# Patient Record
Sex: Male | Born: 1935 | Race: White | Hispanic: No | Smoking: Never smoker
Health system: Southern US, Community
[De-identification: ages and names within clinical notes are randomized; demographics above are authoritative.]

## PROBLEM LIST (undated history)

## (undated) DIAGNOSIS — D72829 Elevated white blood cell count, unspecified: Secondary | ICD-10-CM

## (undated) DIAGNOSIS — E876 Hypokalemia: Secondary | ICD-10-CM

## (undated) DIAGNOSIS — K922 Gastrointestinal hemorrhage, unspecified: Secondary | ICD-10-CM

## (undated) DIAGNOSIS — I482 Chronic atrial fibrillation, unspecified: Secondary | ICD-10-CM

## (undated) DIAGNOSIS — I2510 Atherosclerotic heart disease of native coronary artery without angina pectoris: Secondary | ICD-10-CM

## (undated) DIAGNOSIS — I509 Heart failure, unspecified: Secondary | ICD-10-CM

## (undated) DIAGNOSIS — I2119 ST elevation (STEMI) myocardial infarction involving other coronary artery of inferior wall: Secondary | ICD-10-CM

## (undated) DIAGNOSIS — Z87442 Personal history of urinary calculi: Secondary | ICD-10-CM

## (undated) DIAGNOSIS — E785 Hyperlipidemia, unspecified: Secondary | ICD-10-CM

## (undated) DIAGNOSIS — K219 Gastro-esophageal reflux disease without esophagitis: Secondary | ICD-10-CM

## (undated) DIAGNOSIS — F419 Anxiety disorder, unspecified: Secondary | ICD-10-CM

## (undated) DIAGNOSIS — K449 Diaphragmatic hernia without obstruction or gangrene: Secondary | ICD-10-CM

## (undated) DIAGNOSIS — N200 Calculus of kidney: Secondary | ICD-10-CM

## (undated) DIAGNOSIS — I2109 ST elevation (STEMI) myocardial infarction involving other coronary artery of anterior wall: Secondary | ICD-10-CM

## (undated) DIAGNOSIS — I10 Essential (primary) hypertension: Secondary | ICD-10-CM

## (undated) DIAGNOSIS — I4891 Unspecified atrial fibrillation: Secondary | ICD-10-CM

## (undated) DIAGNOSIS — N400 Enlarged prostate without lower urinary tract symptoms: Secondary | ICD-10-CM

## (undated) DIAGNOSIS — G4730 Sleep apnea, unspecified: Secondary | ICD-10-CM

## (undated) DIAGNOSIS — I255 Ischemic cardiomyopathy: Secondary | ICD-10-CM

## (undated) HISTORY — DX: ST elevation (STEMI) myocardial infarction involving other coronary artery of inferior wall: I21.19

## (undated) HISTORY — PX: TOTAL KNEE ARTHROPLASTY: SHX125

## (undated) HISTORY — DX: Ischemic cardiomyopathy: I25.5

## (undated) HISTORY — DX: Gastrointestinal hemorrhage, unspecified: K92.2

## (undated) HISTORY — DX: Hyperlipidemia, unspecified: E78.5

## (undated) HISTORY — DX: Essential (primary) hypertension: I10

## (undated) HISTORY — DX: Diaphragmatic hernia without obstruction or gangrene: K44.9

## (undated) HISTORY — DX: Calculus of kidney: N20.0

## (undated) HISTORY — PX: TONSILLECTOMY: SUR1361

## (undated) HISTORY — DX: Benign prostatic hyperplasia without lower urinary tract symptoms: N40.0

## (undated) HISTORY — DX: Sleep apnea, unspecified: G47.30

## (undated) HISTORY — PX: EYE SURGERY: SHX253

## (undated) HISTORY — DX: Atherosclerotic heart disease of native coronary artery without angina pectoris: I25.10

## (undated) HISTORY — DX: ST elevation (STEMI) myocardial infarction involving other coronary artery of anterior wall: I21.09

## (undated) HISTORY — DX: Chronic atrial fibrillation, unspecified: I48.20

## (undated) HISTORY — DX: Gastro-esophageal reflux disease without esophagitis: K21.9

---
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
## 1998-03-15 ENCOUNTER — Other Ambulatory Visit: Admission: RE | Admit: 1998-03-15 | Discharge: 1998-03-15 | Payer: Self-pay | Admitting: Urology

## 2000-06-10 ENCOUNTER — Ambulatory Visit (HOSPITAL_BASED_OUTPATIENT_CLINIC_OR_DEPARTMENT_OTHER): Admission: RE | Admit: 2000-06-10 | Discharge: 2000-06-10 | Payer: Self-pay | Admitting: *Deleted

## 2000-11-22 ENCOUNTER — Ambulatory Visit (HOSPITAL_COMMUNITY): Admission: RE | Admit: 2000-11-22 | Discharge: 2000-11-22 | Payer: Self-pay | Admitting: Gastroenterology

## 2000-11-22 ENCOUNTER — Encounter (INDEPENDENT_AMBULATORY_CARE_PROVIDER_SITE_OTHER): Payer: Self-pay | Admitting: Specialist

## 2004-01-17 ENCOUNTER — Ambulatory Visit (HOSPITAL_COMMUNITY): Admission: RE | Admit: 2004-01-17 | Discharge: 2004-01-17 | Payer: Self-pay | Admitting: Gastroenterology

## 2004-01-17 ENCOUNTER — Encounter (INDEPENDENT_AMBULATORY_CARE_PROVIDER_SITE_OTHER): Payer: Self-pay | Admitting: Specialist

## 2004-10-30 ENCOUNTER — Inpatient Hospital Stay (HOSPITAL_BASED_OUTPATIENT_CLINIC_OR_DEPARTMENT_OTHER): Admission: RE | Admit: 2004-10-30 | Discharge: 2004-10-30 | Payer: Self-pay | Admitting: Cardiology

## 2010-06-22 ENCOUNTER — Ambulatory Visit: Payer: Self-pay | Admitting: Cardiology

## 2010-10-13 ENCOUNTER — Encounter: Admission: RE | Admit: 2010-10-13 | Discharge: 2010-10-13 | Payer: Self-pay | Admitting: Gastroenterology

## 2010-11-02 ENCOUNTER — Encounter
Admission: RE | Admit: 2010-11-02 | Discharge: 2010-11-02 | Payer: Self-pay | Source: Home / Self Care | Attending: Cardiology | Admitting: Cardiology

## 2010-11-02 ENCOUNTER — Ambulatory Visit: Payer: Self-pay | Admitting: Cardiology

## 2011-03-12 ENCOUNTER — Other Ambulatory Visit: Payer: Self-pay | Admitting: Cardiology

## 2011-03-12 DIAGNOSIS — E7800 Pure hypercholesterolemia, unspecified: Secondary | ICD-10-CM

## 2011-03-12 NOTE — Telephone Encounter (Signed)
escribe request  

## 2011-03-30 NOTE — Op Note (Signed)
NAME:  Henry Ayala, Henry Ayala                        ACCOUNT NO.:  [id number]   MEDICAL RECORD NO.:  [id number]                   PATIENT TYPE:  AMB   LOCATION:  ENDO                                 FACILITY:  [facility] [place]   PHYSICIAN:  James L. Malon Kindle., M.D.          DATE OF BIRTH:  07-08-1931   DATE OF PROCEDURE:  01/17/2004  DATE OF DISCHARGE:                                 OPERATIVE REPORT   PROCEDURE:  Colonoscopy and polypectomy.   MEDICATIONS:  1. Fentanyl 62.5 mcg.  2. Versed 7 mg IV.   INDICATION:  Previous history of adenomatous polyps.  This is done as a  follow-up.   DESCRIPTION OF PROCEDURE:  The procedure had been explained to the patient  and consent obtained.  With the patient in the left lateral decubitus  position, the Olympus scope was inserted and advanced.  We were able to  advance easily to the cecum.  The terminal ileum was entered for a short  distance and was normal.  The scope was withdrawn, and the cecum, ascending  colon, transverse colon, descending colon were seen well.  In the distal  descending colon, at approximately 60 cm from the anal verge, a 0.75 cm  polyp was encountered, was removed with the snare and sucked through the  scope.  There was no bleeding.  No other polyps were seen in the descending  or sigmoid colon.  There was scattered diverticula.  The scope withdrawn.  The rectum was free of polyps.  The patient tolerated the procedure well.   ASSESSMENT:  Descending colon polyp, removed, 221.3.   PLAN:  We will check path and recommend repeating procedure in 3 years.  We will give Plavix for 3 days and then aspirin for 7.                                               James L. Malon Kindle., M.D.    Waldron Session  D:  01/17/2004  T:  01/17/2004  Job:  [id number]   cc:   Cassell Clement, M.D.  [address], [address]  [place]  [place] [address]  Fax: [phone]

## 2011-03-30 NOTE — H&P (Signed)
NAME:  Henry Ayala, VO NO.:  [id number]   MEDICAL RECORD NO.:  [id number]          PATIENT TYPE:  AMB   LOCATION:                               FACILITY:  [facility]   PHYSICIAN:  Peter M. Swaziland, M.D.  DATE OF BIRTH:  1931-03-26   DATE OF ADMISSION:  10/30/2004  DATE OF DISCHARGE:                                HISTORY & PHYSICAL   HISTORY OF PRESENT ILLNESS:  Henry Ayala is a 77 year old white male with  history of coronary artery disease.  He suffered an anterior septal  myocardial infarction in 1992 and was treated with direct angioplasty to the  LAD.  Subsequent followup cardiac catheterizations in 1994 and 1995 showed  nonobstructive disease.  He subsequently suffered an acute inferior  myocardial infarction in August 2004 and underwent stenting of the right  coronary artery at [facility].  On recent stress  Cardiolite study, the patient performed to 9 minutes and 40 seconds on the  Bruce protocol with mild symptoms of dyspnea.  His ECG showed no acute  changes.  Cardiolite images demonstrated both anteroseptal and inferior wall  defects with partial reversibility of the anteroseptal defect.  Ejection  fraction was depressed at 37%.  Based on his abnormal Cardiolite study,  followup cardiac catheterization was recommended.   Of note, the patient has experienced some mild dyspnea with exertion.  He  denies any chest pain, palpitations, diaphoresis, nausea or vomiting.   PAST MEDICAL HISTORY:  1.  Anteroseptal myocardial infarction in 1992.  2.  Inferior myocardial infarction in August 2004.  3.  Colon polyps.  4.  Hyperlipidemia.  5.  Nephrolithiasis.  6.  BPH.  7.  Hiatal hernia.  8.  Status post right knee surgery.   ALLERGIES:  Questionable allergy to SULFA.  He also has a questionable  allergy to DYE in 1992 where he developed a rash post hospitalization.  He  has had no reaction to dye study since that time.   MEDICATIONS:  1.   Aspirin 81 mg per day.  2.  Lopressor 25 mg per day.  3.  Zoloft 50 mg per day.  4.  Cardura 2 mg per day.  5.  Prevacid 30 mg per day.  6.  Crestor 10 mg per day.  7.  Plavix 75 mg per day.  8.  Eye drops, unknown type.   FAMILY HISTORY:  Father died at age 53 with myocardial infarction.  One  brother died with myocardial infarction.  One brother has had multiple  coronary stents placed.  His mother died with a stroke at age 46.   SOCIAL HISTORY:  The patient is retired.  He is married.  He has two  children.  He denies tobacco use and occasionally drinks alcohol.   REVIEW OF SYSTEMS:  The patient does exercise regularly and denies any  significant complaints with that.  He does note some mild shortness of  breath on exertion.  He does have a history of sleep apnea.  Other Review of  Systems are negative.   PHYSICAL EXAMINATION:  GENERAL:  The  patient is a pleasant white male in no  apparent distress.  VITAL SIGNS:  Weight 169.  Blood pressure 110/70, pulse 60 and regular,  respirations normal.  HEENT:  Normocephalic and atraumatic.  Pupils equal, round, and reactive to  light and accommodation.  Extraocular movements are full.  Oropharynx is  clear.  NECK:  Supple without JVD, adenopathy, thyromegaly, or bruits.  LUNGS:  Clear.  CARDIAC:  Regular rate and rhythm.  Normal S1 and S2 without gallop, murmur,  rub, or click.  ABDOMEN:  Soft and nontender.  He has no masses or hepatosplenomegaly.  EXTREMITIES:  Femoral and pedal pulses are 2+ and symmetric.  NEUROLOGIC:  Nonfocal.   LABORATORY DATA AND OTHER STUDIES:  ECG shows normal sinus rhythm with old  inferior myocardial infarction and old anteroseptal myocardial infarction.   Chest x-ray shows no active disease.   Coags were normal.  Glucose 65, BUN 12, creatinine 1.1.  Sodium 140,  potassium 4.4, chloride 105, CO2 28.  CBC is normal.   IMPRESSION:  1.  Abnormal stress Cardiolite study.  2.  Prior anteroseptal  myocardial infarction in 1992.  3.  Prior inferior myocardial infarction August 2004.  4.  Ischemic left ventricular dysfunction.  5.  Hyperlipidemia.   PLAN:  Will proceed with cardiac catheterization with further therapy  pending these results.        ___________________________________________  Peter M. Swaziland, M.D.    PMJ/MEDQ  D:  10/27/2004  T:  10/27/2004  Job:  [id number]   cc:   Cassell Clement, M.D.  [address]  [place]  [place] [address]  Fax: [phone]

## 2011-03-30 NOTE — Cardiovascular Report (Signed)
NAME:  Henry Ayala, Henry Ayala NO.:  [id number]   MEDICAL RECORD NO.:  [id number]          PATIENT TYPE:  OIB   LOCATION:  6501                         FACILITY:  [facility]   PHYSICIAN:  Peter M. Swaziland, M.D.  DATE OF BIRTH:  [birth year]   DATE OF PROCEDURE:  10/30/2004  DATE OF DISCHARGE:  10/30/2004                              CARDIAC CATHETERIZATION   INDICATION FOR PROCEDURE:  A 77 year old white male with a history of prior  myocardial infarction in 1992 and again in 2004.  Initially had angioplasty  with his first MI in LAD.  His last infarct involved the right coronary  territory and stenting in the proximal vessel.  He has had mild symptoms of  dyspnea and had a recent abnormal stress test.   PROCEDURE:  1.  Left heart catheterization.  2.  Coronary and left ventricular angiography.   ACCESS:  Via the right femoral artery using standard Seldinger technique.   EQUIPMENT:  4-French 4 cm right and left Judkins catheter, 4-French pigtail  catheter, 4-French arterial sheath.   CONTRAST:  100 mL of Omnipaque.   MEDICATIONS:  Local anesthesia 1% Xylocaine.   HEMODYNAMIC DATA:  1.  Aortic pressure was 113/66 with a mean of 87.  2.  Left ventricular pressure was 119 with EDP of 10 mmHg.   ANGIOGRAPHIC DATA:  1.  The left coronary artery arises and distributes normally.  The left main      coronary artery has minor wall irregularities less than 10%.  2.  Left anterior descending artery is a large vessel which extends to the      apex.  It also has mild wall irregularities less than 10%.  There is a      large septal perforator branch which appears normal.  There are two      large diagonal branches which predominantly supply the lateral wall and      they appear normal.  3.  The left circumflex coronary artery is a small vessel.  It is occluded      proximally.  There are right to left collaterals to the distal      circumflex.  4.  The right coronary artery arises  and distributes normally.  This is a      large, dominant vessel.  There is a stent visible in the proximal      vessel.  It appears widely patent.  There are mild irregularities      throughout the proximal mid and distal right coronary artery.  The      posterior descending artery has an eccentric 50-70% stenosis in the      proximal vessel.  This is a focal lesion.  There are two posterolateral      branches which are without significant disease.   LEFT VENTRICULOGRAPHY:  Left ventricular angiography performed in the RAO  view demonstrates moderate hypokinesia involving the anterolateral wall and  inferior wall.  Ejection fraction is estimated at 35-40%.  There is no  significant mitral insufficiency.   FINAL INTERPRETATION:  1.  Two vessel atherosclerotic coronary artery disease.  There is continued      excellent long-term patency of the prior angioplasty site in the left      anterior descending and the prior stent site in the proximal right      coronary artery.  There is total occlusion of the proximal circumflex      which is a small branch and is not suitable for intervention.  There is      moderate disease in the proximal posterior descending artery.  2.  Moderate left ventricular dysfunction.   PLAN:  Recommend medical therapy at this time.  Will obtain a copy of his  old catheterization films for comparison.       PMJ/MEDQ  D:  10/30/2004  T:  10/31/2004  Job:  [id number]   cc:   Cassell Clement, M.D.  [address], [address]  [place]  [place] [address]  Fax: [phone]

## 2011-03-30 NOTE — Procedures (Signed)
[facility]  Patient:    Henry Ayala, Henry Ayala                     MRN: [id number] Proc. Date: 11/22/00 Adm. Date:  [id number] Attending:  Orland Mustard CC:         Clovis Pu Patty Sermons, M.D.   Procedure Report  DATE OF BIRTH:  Jan 25, 1931  PROCEDURE:  Colonoscopy with coagulation of polyp.  SURGEON:  James L. Edwards, M.D.  MEDICATIONS:  Fentanyl 75 mcg and Versed 6 mg IV.  INDICATIONS:  A nice gentleman with a strong family history of colon cancer. Had a colonoscopy three years ago with the finding of an adenomatous polyp. This procedure is done to evaluate for additional polyps.  DESCRIPTION OF PROCEDURE:  The procedure had been explained to the patient and consent obtained.  With the patient in the left lateral decubitus position, the Olympus adult video colonoscope was inserted and advanced under direct visualization.  The prep was excellent.  We were able to advance to the cecum without difficulty.  The cecum did reveal three polyps, each 2-3 mm in diameter.  Each was cauterized and placed in a single jar.  No other polyps were seen in the cecum.  The scope was withdrawn.  The cecum, ascending colon, hepatic flexure, transverse colon, splenic flexure, descending and sigmoid colon were seen well upon removal.  No polyps or other lesions were seen. There was no significant diverticular disease.  The rectum was seen well and was normal.  The scope was withdrawn.  The patient tolerated the procedure well and was maintained on low flow oxygen and pulse oximeter throughout the procedure with no obvious problems.  ASSESSMENT:  Cecal polyps, cauterized.  PLAN:  Will recommend repeating procedure in three years. DD:  11/22/00 TD:  11/23/00 Job: [id number]

## 2011-04-25 ENCOUNTER — Encounter: Payer: Self-pay | Admitting: *Deleted

## 2011-04-26 ENCOUNTER — Other Ambulatory Visit: Payer: Self-pay | Admitting: *Deleted

## 2011-04-26 DIAGNOSIS — E785 Hyperlipidemia, unspecified: Secondary | ICD-10-CM

## 2011-04-26 DIAGNOSIS — Z125 Encounter for screening for malignant neoplasm of prostate: Secondary | ICD-10-CM

## 2011-04-27 ENCOUNTER — Other Ambulatory Visit: Payer: Self-pay | Admitting: *Deleted

## 2011-04-27 ENCOUNTER — Encounter: Payer: Self-pay | Admitting: Cardiology

## 2011-04-27 ENCOUNTER — Other Ambulatory Visit (INDEPENDENT_AMBULATORY_CARE_PROVIDER_SITE_OTHER): Payer: Medicare Other | Admitting: *Deleted

## 2011-04-27 ENCOUNTER — Ambulatory Visit (INDEPENDENT_AMBULATORY_CARE_PROVIDER_SITE_OTHER): Payer: Medicare Other | Admitting: Cardiology

## 2011-04-27 VITALS — BP 120/70 | HR 64 | Wt 164.0 lb

## 2011-04-27 DIAGNOSIS — K219 Gastro-esophageal reflux disease without esophagitis: Secondary | ICD-10-CM

## 2011-04-27 DIAGNOSIS — N400 Enlarged prostate without lower urinary tract symptoms: Secondary | ICD-10-CM

## 2011-04-27 DIAGNOSIS — R351 Nocturia: Secondary | ICD-10-CM

## 2011-04-27 DIAGNOSIS — E785 Hyperlipidemia, unspecified: Secondary | ICD-10-CM

## 2011-04-27 DIAGNOSIS — Z125 Encounter for screening for malignant neoplasm of prostate: Secondary | ICD-10-CM

## 2011-04-27 DIAGNOSIS — I259 Chronic ischemic heart disease, unspecified: Secondary | ICD-10-CM | POA: Insufficient documentation

## 2011-04-27 DIAGNOSIS — E7800 Pure hypercholesterolemia, unspecified: Secondary | ICD-10-CM | POA: Insufficient documentation

## 2011-04-27 DIAGNOSIS — I519 Heart disease, unspecified: Secondary | ICD-10-CM

## 2011-04-27 DIAGNOSIS — N401 Enlarged prostate with lower urinary tract symptoms: Secondary | ICD-10-CM

## 2011-04-27 LAB — HEPATIC FUNCTION PANEL
ALT: 18 U/L (ref 0–53)
Albumin: 4.2 g/dL (ref 3.5–5.2)
Alkaline Phosphatase: 58 U/L (ref 39–117)
Bilirubin, Direct: 0.1 mg/dL (ref 0.0–0.3)
Total Protein: 6.8 g/dL (ref 6.0–8.3)

## 2011-04-27 LAB — BASIC METABOLIC PANEL
CO2: 29 mEq/L (ref 19–32)
Calcium: 9.1 mg/dL (ref 8.4–10.5)
Chloride: 103 mEq/L (ref 96–112)
Glucose, Bld: 93 mg/dL (ref 70–99)
Sodium: 138 mEq/L (ref 135–145)

## 2011-04-27 LAB — PSA: PSA: 1.54 ng/mL (ref 0.10–4.00)

## 2011-04-27 LAB — LIPID PANEL: Total CHOL/HDL Ratio: 3

## 2011-04-27 MED ORDER — CLOPIDOGREL BISULFATE 75 MG PO TABS
75.0000 mg | ORAL_TABLET | Freq: Every day | ORAL | Status: DC
Start: 2011-04-27 — End: 2011-04-27

## 2011-04-27 MED ORDER — CLOPIDOGREL BISULFATE 75 MG PO TABS: 75.0000 mg | ORAL_TABLET | Freq: Every day | ORAL | Status: DC

## 2011-04-27 NOTE — Progress Notes (Signed)
Orlando Penner Date of Birth:  [birth year] [facility] / [facility] [address]   [address] [place], [place]  [address] [phone]           Fax   [phone]  History of Present Illness: This pleasant 77 year old gentleman is seen for a Six-month followup office visit.  He has a history of ischemic heart disease.  He had an anteroseptal myocardial infarction in 1992 and he had an inferior wall myocardial infarction in 2004.  He had angioplasty of his LAD in 1992 and had angioplasty and stenting of a proximal right coronary artery occlusion in 2004.  His last cardiac catheterization was in 2005 showing excellent continued patency of the stents.  His last nuclear stress test was 07/30/08 showing who anteroseptal and inferoapical scars but no reversible ischemia and his ejection fraction was 40%.  The patient denies any recent symptoms of angina.  He exercises by going to the gym 3 or more times a week.  Patient also has a history of GERD.  He is on generic Prevacid 30 mg daily with good resolution of his symptoms of GERD.  Current Outpatient Prescriptions  Medication Sig Dispense Refill  . aspirin 81 MG tablet Take 81 mg by mouth daily.        . bimatoprost (LUMIGAN) 0.03 % ophthalmic solution Place 1 drop into both eyes at bedtime.        . cholecalciferol (VITAMIN D) 1000 UNITS tablet Take 1,000 Units by mouth daily.        [name] doxazosin (CARDURA) 4 MG tablet Take 4 mg by mouth at bedtime.        . lansoprazole (PREVACID) 30 MG capsule Take 30 mg by mouth daily.        [name] lisinopril (PRINIVIL,ZESTRIL) 10 MG tablet Take 10 mg by mouth daily.        [name] lovastatin (MEVACOR) 40 MG tablet TAKE ONE TABLET BY MOUTH EVERY DAY  90 tablet  3  . metoprolol tartrate (LOPRESSOR) 25 MG tablet Take 12.5 mg by mouth daily.        . Omega-3 Fatty Acids (OMEGA 3 PO) Take 1 capsule by mouth daily.        . sertraline (ZOLOFT) 50 MG tablet Take 50 mg by mouth daily.        [name] DISCONTD: clopidogrel  (PLAVIX) 75 MG tablet Take 75 mg by mouth daily.        . clopidogrel (PLAVIX) 75 MG tablet Take 1 tablet (75 mg total) by mouth daily.  90 tablet  3  . DISCONTD: clopidogrel (PLAVIX) 75 MG tablet Take 1 tablet (75 mg total) by mouth daily.  90 tablet  3    Allergies  Allergen Reactions  . Iodinated Diagnostic Agents   . Lipitor (Atorvastatin Calcium)   . Sulfa Drugs Cross Reactors     Patient Active Problem List  Diagnoses  . Ischemic heart disease  . Hypercholesterolemia  . GERD (gastroesophageal reflux disease)  . BPH (benign prostatic hyperplasia)    History  Smoking status  . Never Smoker   Smokeless tobacco  . Never Used    History  Alcohol Use No    Family History  Problem Relation Age of Onset  . Heart attack Father   . Stroke Mother   . Heart attack Brother   . Cancer Sister     colon cancer  . Coronary artery disease Brother     has had multiple coronary stents placed  Review of Systems: Constitutional: no fever chills diaphoresis or fatigue or change in weight.  Head and neck: no hearing loss, no epistaxis, no photophobia or visual disturbance. Respiratory: No cough, shortness of breath or wheezing. Cardiovascular: No chest pain peripheral edema, palpitations. Gastrointestinal: No abdominal distention, no abdominal pain, no change in bowel habits hematochezia or melena. Genitourinary: No dysuria, no frequency, no urgency, no nocturia. Musculoskeletal:No arthralgias, no back pain, no gait disturbance or myalgias. Neurological: No dizziness, no headaches, no numbness, no seizures, no syncope, no weakness, no tremors. Hematologic: No lymphadenopathy, no easy bruising. Psychiatric: No confusion, no hallucinations, no sleep disturbance.    Physical Exam: Filed Vitals:   04/27/11 0839  BP: 120/70  Pulse: 64  The general appearance feels a well-developed well-nourished gentleman in no distress.The head and neck exam reveals pupils equal and reactive.   Extraocular movements are full.  There is no scleral icterus.  The mouth and pharynx are normal.  The neck is supple.  The carotids reveal no bruits.  The jugular venous pressure is normal.  The  thyroid is not enlarged.  There is no lymphadenopathy.  The chest is clear to percussion and auscultation.  There are no rales or rhonchi.  Expansion of the chest is symmetrical.  The precordium is quiet.  The first heart sound is normal.  The second heart sound is physiologically split.  There is no murmur gallop rub or click.  There is no abnormal lift or heave.  The abdomen is soft and nontender.  The bowel sounds are normal.  The liver and spleen are not enlarged.  There are no abdominal masses.  There are no abdominal bruits.  Extremities reveal good pedal pulses.  There is no phlebitis or edema.  There is no cyanosis or clubbing.  Strength is normal and symmetrical in all extremities.  There is no lateralizing weakness.  There are no sensory deficits.  The skin is warm and dry.  There is no rash.   Assessment / Plan: Blood work drawn today.  Continue same medicine.  History of leg cramps and he will try drinking more water and also he may try taking a teaspoon of mustard by mouth each day.  We will also take note to be sure that his serum potassium is okay on today's lab work.  Recheck in 6 months for office visit and fasting lab work and we are also checking a PSA today.

## 2011-04-27 NOTE — Assessment & Plan Note (Signed)
The patient has a past history of GERD.  He is on proton pump inhibitor daily with good results and no recurrent symptoms of GERD

## 2011-04-27 NOTE — Assessment & Plan Note (Signed)
The patient has a history of 2 prior myocardial infarctions.  He has not had coronary bypass graft surgery.  He has had stents.  Since last visit he's been doing well with no recurrent chest pain or angina.  His last nuclear stress test was 07/30/08 and showed no reversible ischemia but did show a anteroseptal and inferoapical scars and his overall ejection fraction is 40%

## 2011-04-27 NOTE — Assessment & Plan Note (Signed)
The patient has a history of BPH.  We are checking a PSA on him today.  He does have nocturia several times a night and he has urinary frequency.  His symptoms have not changed significantly over the past year.

## 2011-04-27 NOTE — Telephone Encounter (Signed)
Refilled plavix 

## 2011-04-27 NOTE — Assessment & Plan Note (Signed)
The patient has a past history of hypercholesterolemia.  He is on lovastatin 40 mg a day.  He is having no side effects from lovastatin.  Blood work is pending

## 2011-05-04 ENCOUNTER — Telehealth: Payer: Self-pay | Admitting: *Deleted

## 2011-05-04 ENCOUNTER — Encounter: Payer: Self-pay | Admitting: *Deleted

## 2011-05-04 NOTE — Telephone Encounter (Signed)
Message copied by Burnell Blanks on Fri May 04, 2011  1:46 PM ------      Message from: Cassell Clement      Created: Fri Apr 27, 2011  5:10 PM       Lab work is satisfactory.The PSA is normal at 1.54.  Continue same meds

## 2011-05-04 NOTE — Telephone Encounter (Signed)
Called again and patient not home. Mailed letter and left message any questions received to call.

## 2011-10-24 ENCOUNTER — Encounter: Payer: Self-pay | Admitting: Cardiology

## 2011-10-24 ENCOUNTER — Ambulatory Visit (INDEPENDENT_AMBULATORY_CARE_PROVIDER_SITE_OTHER): Payer: Medicare Other | Admitting: Cardiology

## 2011-10-24 ENCOUNTER — Other Ambulatory Visit: Payer: Medicare Other | Admitting: *Deleted

## 2011-10-24 VITALS — BP 118/74 | HR 66 | Ht 68.0 in | Wt 163.0 lb

## 2011-10-24 DIAGNOSIS — I119 Hypertensive heart disease without heart failure: Secondary | ICD-10-CM

## 2011-10-24 DIAGNOSIS — E7800 Pure hypercholesterolemia, unspecified: Secondary | ICD-10-CM

## 2011-10-24 DIAGNOSIS — K219 Gastro-esophageal reflux disease without esophagitis: Secondary | ICD-10-CM

## 2011-10-24 DIAGNOSIS — I259 Chronic ischemic heart disease, unspecified: Secondary | ICD-10-CM

## 2011-10-24 LAB — HEPATIC FUNCTION PANEL
ALT: 15 U/L (ref 0–53)
AST: 19 U/L (ref 0–37)
Albumin: 4 g/dL (ref 3.5–5.2)
Total Protein: 6.7 g/dL (ref 6.0–8.3)

## 2011-10-24 LAB — LIPID PANEL
Cholesterol: 172 mg/dL (ref 0–200)
HDL: 55.6 mg/dL (ref >39.00)
Triglycerides: 87 mg/dL (ref 0.0–149.0)
VLDL: 17.4 mg/dL (ref 0.0–40.0)

## 2011-10-24 LAB — BASIC METABOLIC PANEL
Calcium: 8.9 mg/dL (ref 8.4–10.5)
GFR: 68.74 mL/min (ref >60.00)
Glucose, Bld: 83 mg/dL (ref 70–99)
Potassium: 4.2 mEq/L (ref 3.5–5.1)
Sodium: 140 mEq/L (ref 135–145)

## 2011-10-24 NOTE — Patient Instructions (Signed)
Will get labs today and call you with the results Your physician recommends that you continue on your current medications as directed. Please refer to the Current Medication list given to you today. Your physician wants you to follow-up in: 6 month You will receive a reminder letter in the mail two months in advance. If you don't receive a letter, please call our office to schedule the follow-up appointment.

## 2011-10-24 NOTE — Progress Notes (Signed)
Henry Ayala Date of Birth:  [birth year] [facility] / [facility] [address]   [address] [place], [place]  [address] [phone]           Fax   [phone]  History of Present Illness: This 77 year old gentleman is seen in follow up 6 months office visit.  He has a history of known ischemic heart disease.  He had an anterior wall myocardial infarction in 1992 and an inferior wall myocardial infarction 2004.  He had angioplasty of his LAD in 1992 and angioplasty with stenting of a proximal right coronary artery occlusion in 2004.  Her last nuclear stress test was 07/30/08 showing old infero-apical scar and anteroseptal scar but no reversible ischemia and his ejection fraction was 40%.  The patient continues to do well with no angina.  He is not having any symptoms of congestive heart failure.  He goes to the gym 3 times a week or more and exercises.  Current Outpatient Prescriptions  Medication Sig Dispense Refill  . aspirin 81 MG tablet Take 81 mg by mouth daily.        . bimatoprost (LUMIGAN) 0.03 % ophthalmic solution Place 1 drop into both eyes at bedtime.        . cholecalciferol (VITAMIN D) 1000 UNITS tablet Take 1,000 Units by mouth daily.        . clopidogrel (PLAVIX) 75 MG tablet Take 1 tablet (75 mg total) by mouth daily.  90 tablet  3  . doxazosin (CARDURA) 4 MG tablet Take 4 mg by mouth at bedtime.        . Esomeprazole Magnesium (NEXIUM PO) Take by mouth.        [name] lisinopril (PRINIVIL,ZESTRIL) 10 MG tablet Take 10 mg by mouth daily.        [name] lovastatin (MEVACOR) 40 MG tablet TAKE ONE TABLET BY MOUTH EVERY DAY  90 tablet  3  . metoprolol tartrate (LOPRESSOR) 25 MG tablet Take 12.5 mg by mouth daily.        . Omega-3 Fatty Acids (OMEGA 3 PO) Take 1 capsule by mouth daily.        . sertraline (ZOLOFT) 50 MG tablet Take 50 mg by mouth daily.          Allergies  Allergen Reactions  . Iodinated Diagnostic Agents   . Lipitor (Atorvastatin Calcium)   . Sulfa  Drugs Cross Reactors     Patient Active Problem List  Diagnoses  . Ischemic heart disease  . Hypercholesterolemia  . GERD (gastroesophageal reflux disease)  . BPH (benign prostatic hyperplasia)    History  Smoking status  . Never Smoker   Smokeless tobacco  . Never Used    History  Alcohol Use No    Family History  Problem Relation Age of Onset  . Heart attack Father   . Stroke Mother   . Heart attack Brother   . Cancer Sister     colon cancer  . Coronary artery disease Brother     has had multiple coronary stents placed    Review of Systems: Constitutional: no fever chills diaphoresis or fatigue or change in weight.  Head and neck: no hearing loss, no epistaxis, no photophobia or visual disturbance. Respiratory: No cough, shortness of breath or wheezing. Cardiovascular: No chest pain peripheral edema, palpitations. Gastrointestinal: No abdominal distention, no abdominal pain, no change in bowel habits hematochezia or melena. Genitourinary: No dysuria, no frequency, no urgency, no nocturia. Musculoskeletal:No arthralgias, no back pain,  no gait disturbance or myalgias. Neurological: No dizziness, no headaches, no numbness, no seizures, no syncope, no weakness, no tremors. Hematologic: No lymphadenopathy, no easy bruising. Psychiatric: No confusion, no hallucinations, no sleep disturbance.    Physical Exam: Filed Vitals:   10/24/11 1110  BP: 118/74  Pulse: 66   the general appearance reveals a well-developed well-nourished gentleman in no distressThe head and neck exam reveals pupils equal and reactive.  Extraocular movements are full.  There is no scleral icterus.  The mouth and pharynx are normal.  The neck is supple.  The carotids reveal no bruits.  The jugular venous pressure is normal.  The  thyroid is not enlarged.  There is no lymphadenopathy.  The chest is clear to percussion and auscultation.  There are no rales or rhonchi.  Expansion of the chest is  symmetrical.  The precordium is quiet.  The first heart sound is normal.  The second heart sound is physiologically split.  There is no murmur gallop rub or click.  There is no abnormal lift or heave.  The abdomen is soft and nontender.  The bowel sounds are normal.  The liver and spleen are not enlarged.  There are no abdominal masses.  There are no abdominal bruits.  Extremities reveal good pedal pulses.  There is no phlebitis or edema.  There is no cyanosis or clubbing.  Strength is normal and symmetrical in all extremities.  There is no lateralizing weakness.  There are no sensory deficits.  The skin is warm and dry.  There is no rash.     Assessment / Plan: The patient is to continue same medication.  Blood work pending today.  Recheck in 6 months for office visit EKG and fasting lab work.

## 2011-10-24 NOTE — Assessment & Plan Note (Signed)
The patient is on lovastatin 40 mg daily for his hypercholesterolemia.  The patient has been experiencing occasional nocturnal leg cramps.  He is not having any myalgias during the day.  Since his last LDL was just over 100 we don't want to cut back on the lovastatin at this time.  We will see how today's labs are.  If he nocturnal leg cramps becoming intolerable we could consider switching to a different statin.

## 2011-10-24 NOTE — Assessment & Plan Note (Signed)
No chest pain.  No palpitations.  No dizziness or syncope.

## 2011-10-24 NOTE — Assessment & Plan Note (Signed)
The patient sleeps on a wedge because of his tendency toward nocturnal reflux.  The wedge has helped.  He also takes Nexium 40 mg daily.

## 2011-10-25 ENCOUNTER — Telehealth: Payer: Self-pay | Admitting: *Deleted

## 2011-10-25 NOTE — Telephone Encounter (Signed)
Advised wife of labs 

## 2011-10-25 NOTE — Telephone Encounter (Signed)
Message copied by Burnell Blanks on Thu Oct 25, 2011  4:21 PM ------      Message from: Cassell Clement      Created: Wed Oct 24, 2011  7:06 PM       Please report.  The labs are stable.  Continue same meds.  Continue careful diet.

## 2011-11-30 ENCOUNTER — Other Ambulatory Visit: Payer: Self-pay | Admitting: Cardiology

## 2011-11-30 NOTE — Telephone Encounter (Signed)
Refilled lisinopril

## 2011-12-17 ENCOUNTER — Other Ambulatory Visit: Payer: Self-pay | Admitting: Cardiology

## 2011-12-18 DIAGNOSIS — K219 Gastro-esophageal reflux disease without esophagitis: Secondary | ICD-10-CM | POA: Diagnosis not present

## 2011-12-18 DIAGNOSIS — Z86010 Personal history of colonic polyps: Secondary | ICD-10-CM | POA: Diagnosis not present

## 2012-01-02 ENCOUNTER — Telehealth: Payer: Self-pay | Admitting: Cardiology

## 2012-01-02 NOTE — Telephone Encounter (Signed)
Per Marchelle Folks pt having procedure on March 5th with Dr. Randa Evens and they are calling to check on surgical clearance sheet that was faxed over

## 2012-01-02 NOTE — Telephone Encounter (Signed)
Left message to please refax

## 2012-01-04 ENCOUNTER — Telehealth: Payer: Self-pay | Admitting: Cardiology

## 2012-01-04 NOTE — Telephone Encounter (Signed)
New Problem   Marchelle Folks- Dr. Randa Evens nurse [phone]  Says she has sent several requests to have patient Plavix RX  Stopped for colonoscopy procedure on 01/15/12.  Please return call to Dr. Randa Evens Nurse Marchelle Folks ASAP.

## 2012-01-04 NOTE — Telephone Encounter (Signed)
Left message, will have him sign Tuesday and fax back

## 2012-01-15 ENCOUNTER — Encounter: Payer: Self-pay | Admitting: Cardiology

## 2012-01-15 ENCOUNTER — Other Ambulatory Visit: Payer: Self-pay | Admitting: Gastroenterology

## 2012-01-15 DIAGNOSIS — Z09 Encounter for follow-up examination after completed treatment for conditions other than malignant neoplasm: Secondary | ICD-10-CM | POA: Diagnosis not present

## 2012-01-15 DIAGNOSIS — K5730 Diverticulosis of large intestine without perforation or abscess without bleeding: Secondary | ICD-10-CM | POA: Diagnosis not present

## 2012-01-15 DIAGNOSIS — Z86010 Personal history of colonic polyps: Secondary | ICD-10-CM | POA: Diagnosis not present

## 2012-01-15 DIAGNOSIS — K219 Gastro-esophageal reflux disease without esophagitis: Secondary | ICD-10-CM | POA: Diagnosis not present

## 2012-01-15 DIAGNOSIS — K449 Diaphragmatic hernia without obstruction or gangrene: Secondary | ICD-10-CM | POA: Diagnosis not present

## 2012-01-15 DIAGNOSIS — K648 Other hemorrhoids: Secondary | ICD-10-CM | POA: Diagnosis not present

## 2012-01-15 DIAGNOSIS — D126 Benign neoplasm of colon, unspecified: Secondary | ICD-10-CM | POA: Diagnosis not present

## 2012-01-15 DIAGNOSIS — K319 Disease of stomach and duodenum, unspecified: Secondary | ICD-10-CM | POA: Diagnosis not present

## 2012-02-14 ENCOUNTER — Other Ambulatory Visit: Payer: Self-pay | Admitting: Cardiology

## 2012-02-14 NOTE — Telephone Encounter (Signed)
Refilled cardura,mevacor and zoloft

## 2012-02-25 DIAGNOSIS — H409 Unspecified glaucoma: Secondary | ICD-10-CM | POA: Diagnosis not present

## 2012-02-25 DIAGNOSIS — H4011X0 Primary open-angle glaucoma, stage unspecified: Secondary | ICD-10-CM | POA: Diagnosis not present

## 2012-03-10 DIAGNOSIS — D235 Other benign neoplasm of skin of trunk: Secondary | ICD-10-CM | POA: Diagnosis not present

## 2012-03-10 DIAGNOSIS — L570 Actinic keratosis: Secondary | ICD-10-CM | POA: Diagnosis not present

## 2012-04-25 ENCOUNTER — Ambulatory Visit (INDEPENDENT_AMBULATORY_CARE_PROVIDER_SITE_OTHER): Payer: Medicare Other | Admitting: Cardiology

## 2012-04-25 ENCOUNTER — Encounter: Payer: Self-pay | Admitting: Cardiology

## 2012-04-25 VITALS — BP 128/80 | HR 60 | Ht 68.0 in | Wt 158.0 lb

## 2012-04-25 DIAGNOSIS — K219 Gastro-esophageal reflux disease without esophagitis: Secondary | ICD-10-CM | POA: Diagnosis not present

## 2012-04-25 DIAGNOSIS — I259 Chronic ischemic heart disease, unspecified: Secondary | ICD-10-CM | POA: Diagnosis not present

## 2012-04-25 DIAGNOSIS — E7800 Pure hypercholesterolemia, unspecified: Secondary | ICD-10-CM | POA: Diagnosis not present

## 2012-04-25 LAB — HEPATIC FUNCTION PANEL
AST: 18 U/L (ref 0–37)
Albumin: 3.7 g/dL (ref 3.5–5.2)
Alkaline Phosphatase: 51 U/L (ref 39–117)
Bilirubin, Direct: 0.1 mg/dL (ref 0.0–0.3)
Total Bilirubin: 1.1 mg/dL (ref 0.3–1.2)

## 2012-04-25 LAB — LIPID PANEL
HDL: 61.8 mg/dL (ref >39.00)
LDL Cholesterol: 68 mg/dL (ref 0–99)
Total CHOL/HDL Ratio: 2
Triglycerides: 71 mg/dL (ref 0.0–149.0)

## 2012-04-25 LAB — BASIC METABOLIC PANEL
CO2: 28 mEq/L (ref 19–32)
Chloride: 106 mEq/L (ref 96–112)
Creatinine, Ser: 1.1 mg/dL (ref 0.4–1.5)
Glucose, Bld: 85 mg/dL (ref 70–99)

## 2012-04-25 NOTE — Patient Instructions (Addendum)
Your physician recommends that you continue on your current medications as directed. Please refer to the Current Medication list given to you today. Your physician wants you to follow-up in: 6 months with fasting labs You will receive a reminder letter in the mail two months in advance. If you don't receive a letter, please call our office to schedule the follow-up appointment.   

## 2012-04-25 NOTE — Assessment & Plan Note (Signed)
No chest pain.  No exertional dyspnea.  No dizziness or syncope.  No palpitations.

## 2012-04-25 NOTE — Assessment & Plan Note (Signed)
The patient is on lovastatin 40 mg daily for cholesterol.  He is not having a myalgias.  Blood work today is pending.

## 2012-04-25 NOTE — Assessment & Plan Note (Signed)
The patient has a past history of GERD.  He is on Nexium one daily.  He is not having any recent symptoms of GERD

## 2012-04-25 NOTE — Progress Notes (Signed)
Henry Ayala Date of Birth:  1931/10/28 [facility] [address] [place], [place]  [address] [phone]         Fax   [phone]  History of Present Illness: This pleasant 78 year old gentleman is seen for a six-month followup office visit.  He has a history of ischemic heart disease.  He had an anterior wall myocardial infarction in 1992 and had angioplasty of his LAD.  He had an inferior wall myocardial infarction in 2004 and had angioplasty with stenting of a proximal right coronary artery occlusion.  He has not been experiencing any recurrent chest pain.  His last nuclear stress test was 9/18/ Showing an old inferoapical scar and an old anteroseptal scar but no reversible ischemia.  His ejection fraction was 40%.  He is not having any symptoms of angina or congestive heart failure.  Current Outpatient Prescriptions  Medication Sig Dispense Refill  . aspirin 81 MG tablet Take 81 mg by mouth daily.        . bimatoprost (LUMIGAN) 0.03 % ophthalmic solution Place 1 drop into both eyes at bedtime.        . cholecalciferol (VITAMIN D) 1000 UNITS tablet Take 1,000 Units by mouth daily.        . clopidogrel (PLAVIX) 75 MG tablet Take 1 tablet (75 mg total) by mouth daily.  90 tablet  3  . doxazosin (CARDURA) 4 MG tablet TAKE ONE TABLET BY MOUTH EVERY DAY  90 tablet  3  . Esomeprazole Magnesium (NEXIUM PO) Take by mouth.        [name] lisinopril (PRINIVIL,ZESTRIL) 10 MG tablet TAKE ONE TABLET BY MOUTH EVERY DAY  90 tablet  3  . lovastatin (MEVACOR) 40 MG tablet TAKE ONE TABLET BY MOUTH EVERY DAY  90 tablet  3  . metoprolol tartrate (LOPRESSOR) 25 MG tablet TAKE ONE-HALF TABLET BY MOUTH DAILY OR AS DIRECTED  90 tablet  3  . Omega-3 Fatty Acids (OMEGA 3 PO) Take 1 capsule by mouth daily.        . sertraline (ZOLOFT) 50 MG tablet TAKE ONE TABLET BY MOUTH EVERY DAY  90 tablet  3    Allergies  Allergen Reactions  . Iodinated Diagnostic Agents   . Lipitor (Atorvastatin  Calcium)   . Sulfa Drugs Cross Reactors     Patient Active Problem List  Diagnosis  . Ischemic heart disease  . Hypercholesterolemia  . GERD (gastroesophageal reflux disease)  . BPH (benign prostatic hyperplasia)    History  Smoking status  . Never Smoker   Smokeless tobacco  . Never Used    History  Alcohol Use No    Family History  Problem Relation Age of Onset  . Heart attack Father   . Stroke Mother   . Heart attack Brother   . Cancer Sister     colon cancer  . Coronary artery disease Brother     has had multiple coronary stents placed    Review of Systems: Constitutional: no fever chills diaphoresis or fatigue or change in weight.  Head and neck: no hearing loss, no epistaxis, no photophobia or visual disturbance. Respiratory: No cough, shortness of breath or wheezing. Cardiovascular: No chest pain peripheral edema, palpitations. Gastrointestinal: No abdominal distention, no abdominal pain, no change in bowel habits hematochezia or melena. Genitourinary: No dysuria, no frequency, no urgency, no nocturia. Musculoskeletal:No arthralgias, no back pain, no gait disturbance or myalgias. Neurological: No dizziness, no headaches, no numbness, no seizures, no syncope, no  weakness, no tremors. Hematologic: No lymphadenopathy, no easy bruising. Psychiatric: No confusion, no hallucinations, no sleep disturbance.    Physical Exam: Filed Vitals:   04/25/12 1009  BP: 128/80  Pulse: 60   the general appearance reveals a well-developed well-nourished no distress.The head and neck exam reveals pupils equal and reactive.  Extraocular movements are full.  There is no scleral icterus.  The mouth and pharynx are normal.  The neck is supple.  The carotids reveal no bruits.  The jugular venous pressure is normal.  The  thyroid is not enlarged.  There is no lymphadenopathy.  The chest is clear to percussion and auscultation.  There are no rales or rhonchi.  Expansion of the chest is  symmetrical.  The precordium is quiet.  The first heart sound is normal.  The second heart sound is physiologically split.  There is no murmur gallop rub or click.  There is no abnormal lift or heave.  The abdomen is soft and nontender.  The bowel sounds are normal.  The liver and spleen are not enlarged.  There are no abdominal masses.  There are no abdominal bruits.  Extremities reveal good pedal pulses.  There is no phlebitis or edema.  There is no cyanosis or clubbing.  Strength is normal and symmetrical in all extremities.  There is no lateralizing weakness.  There are no sensory deficits.  The skin is warm and dry.  There is no rash.     Assessment / Plan: Continue same medication.  Recheck in 6 months.  Blood work today.  Get EKG and fasting lab work in 6 months.

## 2012-04-26 NOTE — Progress Notes (Signed)
Quick Note:  Please report to patient. The recent labs are stable. Continue same medication and careful diet. ______ 

## 2012-05-11 ENCOUNTER — Other Ambulatory Visit: Payer: Self-pay | Admitting: Cardiology

## 2012-05-29 DIAGNOSIS — M1710 Unilateral primary osteoarthritis, unspecified knee: Secondary | ICD-10-CM | POA: Diagnosis not present

## 2012-05-29 DIAGNOSIS — M25469 Effusion, unspecified knee: Secondary | ICD-10-CM | POA: Diagnosis not present

## 2012-05-29 DIAGNOSIS — IMO0002 Reserved for concepts with insufficient information to code with codable children: Secondary | ICD-10-CM | POA: Diagnosis not present

## 2012-05-29 DIAGNOSIS — Z96659 Presence of unspecified artificial knee joint: Secondary | ICD-10-CM | POA: Diagnosis not present

## 2012-09-08 DIAGNOSIS — L570 Actinic keratosis: Secondary | ICD-10-CM | POA: Diagnosis not present

## 2012-09-22 DIAGNOSIS — H4011X0 Primary open-angle glaucoma, stage unspecified: Secondary | ICD-10-CM | POA: Diagnosis not present

## 2012-09-22 DIAGNOSIS — H409 Unspecified glaucoma: Secondary | ICD-10-CM | POA: Diagnosis not present

## 2012-10-14 DIAGNOSIS — R197 Diarrhea, unspecified: Secondary | ICD-10-CM | POA: Diagnosis not present

## 2012-10-14 DIAGNOSIS — D126 Benign neoplasm of colon, unspecified: Secondary | ICD-10-CM | POA: Diagnosis not present

## 2012-10-15 DIAGNOSIS — Z23 Encounter for immunization: Secondary | ICD-10-CM | POA: Diagnosis not present

## 2012-10-24 ENCOUNTER — Ambulatory Visit (INDEPENDENT_AMBULATORY_CARE_PROVIDER_SITE_OTHER): Payer: Medicare Other | Admitting: Cardiology

## 2012-10-24 ENCOUNTER — Other Ambulatory Visit (INDEPENDENT_AMBULATORY_CARE_PROVIDER_SITE_OTHER): Payer: Medicare Other

## 2012-10-24 ENCOUNTER — Encounter: Payer: Self-pay | Admitting: Cardiology

## 2012-10-24 VITALS — BP 109/64 | HR 49 | Wt 164.0 lb

## 2012-10-24 DIAGNOSIS — N400 Enlarged prostate without lower urinary tract symptoms: Secondary | ICD-10-CM | POA: Diagnosis not present

## 2012-10-24 DIAGNOSIS — K219 Gastro-esophageal reflux disease without esophagitis: Secondary | ICD-10-CM

## 2012-10-24 DIAGNOSIS — I259 Chronic ischemic heart disease, unspecified: Secondary | ICD-10-CM

## 2012-10-24 DIAGNOSIS — E7800 Pure hypercholesterolemia, unspecified: Secondary | ICD-10-CM | POA: Diagnosis not present

## 2012-10-24 LAB — LIPID PANEL
HDL: 51.4 mg/dL (ref >39.00)
LDL Cholesterol: 83 mg/dL (ref 0–99)
Total CHOL/HDL Ratio: 3
Triglycerides: 87 mg/dL (ref 0.0–149.0)
VLDL: 17.4 mg/dL (ref 0.0–40.0)

## 2012-10-24 LAB — HEPATIC FUNCTION PANEL
Albumin: 3.8 g/dL (ref 3.5–5.2)
Alkaline Phosphatase: 45 U/L (ref 39–117)
Total Bilirubin: 0.8 mg/dL (ref 0.3–1.2)

## 2012-10-24 LAB — BASIC METABOLIC PANEL
CO2: 30 mEq/L (ref 19–32)
Calcium: 8.8 mg/dL (ref 8.4–10.5)
Creatinine, Ser: 1 mg/dL (ref 0.4–1.5)
GFR: 78.34 mL/min (ref >60.00)

## 2012-10-24 NOTE — Assessment & Plan Note (Signed)
The patient exercises at the club every other day for about and hour and a half.  He does 30 minutes on the elliptical and does machines.  Is having no chest pain.

## 2012-10-24 NOTE — Assessment & Plan Note (Signed)
Occasional symptoms of GERD if he eats too late in the evening

## 2012-10-24 NOTE — Assessment & Plan Note (Signed)
Patient has a history of BPH.  His brother recently was diagnosed with prostate cancer.  Henry Ayala or plans to see his urologist soon to get a followup PSA and a prostate check.

## 2012-10-24 NOTE — Patient Instructions (Addendum)
Your physician recommends that you continue on your current medications as directed. Please refer to the Current Medication list given to you today.  Your physician wants you to follow-up in: 6 months with fasting labs (lp/bmet/hfp)  You will receive a reminder letter in the mail two months in advance. If you don't receive a letter, please call our office to schedule the follow-up appointment.  

## 2012-10-24 NOTE — Assessment & Plan Note (Signed)
Patient is on lovastatin for his hypercholesterolemia.  He has good numbers on exam today.  He is not having any side effects from lovastatin

## 2012-10-24 NOTE — Progress Notes (Signed)
Henry Ayala Date of Birth:  08-Aug-1931 [facility] [address] [place], [place]  [address] [phone]         Fax   [phone]  History of Present Illness:  This pleasant 78 year old gentleman is seen for a six-month followup office visit. He has a history of ischemic heart disease. He had an anterior wall myocardial infarction in 1992 and had angioplasty of his LAD. He had an inferior wall myocardial infarction in 2004 and had angioplasty with stenting of a proximal right coronary artery occlusion. He has not been experiencing any recurrent chest pain. His last nuclear stress test was several years ago And showed an old inferoapical scar and an old anteroseptal scar but no reversible ischemia. His ejection fraction was 40%. He is not having any symptoms of angina or congestive heart failure.  Current Outpatient Prescriptions  Medication Sig Dispense Refill  . aspirin 81 MG tablet Take 81 mg by mouth daily.        . bimatoprost (LUMIGAN) 0.03 % ophthalmic solution Place 1 drop into both eyes at bedtime.        . cholecalciferol (VITAMIN D) 1000 UNITS tablet Take 1,000 Units by mouth daily.        . clopidogrel (PLAVIX) 75 MG tablet Take 1 tablet (75 mg total) by mouth daily.  90 tablet  3  . clopidogrel (PLAVIX) 75 MG tablet TAKE ONE TABLET BY MOUTH DAILY  30 tablet  11  . doxazosin (CARDURA) 4 MG tablet TAKE ONE TABLET BY MOUTH EVERY DAY  90 tablet  3  . Esomeprazole Magnesium (NEXIUM PO) Take by mouth.        [name] lisinopril (PRINIVIL,ZESTRIL) 10 MG tablet TAKE ONE TABLET BY MOUTH EVERY DAY  90 tablet  3  . lovastatin (MEVACOR) 40 MG tablet TAKE ONE TABLET BY MOUTH EVERY DAY  90 tablet  3  . metoprolol tartrate (LOPRESSOR) 25 MG tablet TAKE ONE-HALF TABLET BY MOUTH DAILY OR AS DIRECTED  90 tablet  3  . Omega-3 Fatty Acids (OMEGA 3 PO) Take 1 capsule by mouth daily.        . sertraline (ZOLOFT) 50 MG tablet TAKE ONE TABLET BY MOUTH EVERY DAY  90 tablet  3     Allergies  Allergen Reactions  . Iodinated Diagnostic Agents   . Lipitor (Atorvastatin Calcium)   . Sulfa Drugs Cross Reactors     Patient Active Problem List  Diagnosis  . Ischemic heart disease  . Hypercholesterolemia  . GERD (gastroesophageal reflux disease)  . BPH (benign prostatic hyperplasia)    History  Smoking status  . Never Smoker   Smokeless tobacco  . Never Used    History  Alcohol Use No    Family History  Problem Relation Age of Onset  . Heart attack Father   . Stroke Mother   . Heart attack Brother   . Cancer Sister     colon cancer  . Coronary artery disease Brother     has had multiple coronary stents placed    Review of Systems: Constitutional: no fever chills diaphoresis or fatigue or change in weight.  Head and neck: no hearing loss, no epistaxis, no photophobia or visual disturbance. Respiratory: No cough, shortness of breath or wheezing. Cardiovascular: No chest pain peripheral edema, palpitations. Gastrointestinal: No abdominal distention, no abdominal pain, no change in bowel habits hematochezia or melena. Genitourinary: No dysuria, no frequency, no urgency, no nocturia. Musculoskeletal:No arthralgias, no back pain, no gait  disturbance or myalgias. Neurological: No dizziness, no headaches, no numbness, no seizures, no syncope, no weakness, no tremors. Hematologic: No lymphadenopathy, no easy bruising. Psychiatric: No confusion, no hallucinations, no sleep disturbance.    Physical Exam: Filed Vitals:   10/24/12 1543  BP: 109/64  Pulse: 49   the general appearance reveals a healthy-appearing gentleman in no distress.The head and neck exam reveals pupils equal and reactive.  Extraocular movements are full.  There is no scleral icterus.  The mouth and pharynx are normal.  The neck is supple.  The carotids reveal no bruits.  The jugular venous pressure is normal.  The  thyroid is not enlarged.  There is no lymphadenopathy.  The chest is  clear to percussion and auscultation.  There are no rales or rhonchi.  Expansion of the chest is symmetrical.  The precordium is quiet.  The first heart sound is normal.  The second heart sound is physiologically split.  There is no murmur gallop rub or click.  There is no abnormal lift or heave.  The abdomen is soft and nontender.  The bowel sounds are normal.  The liver and spleen are not enlarged.  There are no abdominal masses.  There are no abdominal bruits.  Extremities reveal good pedal pulses.  There is no phlebitis or edema.  There is no cyanosis or clubbing.  Strength is normal and symmetrical in all extremities.  There is no lateralizing weakness.  There are no sensory deficits.  The skin is warm and dry.  There is no rash.  EKG shows normal sinus rhythm with old inferior wall MI and old anteroseptal MI.  No prior tracings available for comparison   Assessment / Plan: Continue same medication.  Recheck in 6 months for followup office visit lipid panel hepatic function panel and basal metabolic panel.  His last chest x-ray was 11/02/10 and showed mild cardiomegaly at that time.

## 2012-10-26 NOTE — Progress Notes (Signed)
Quick Note:  Please report to patient. The recent labs are stable. Continue same medication and careful diet. ______ 

## 2012-10-27 ENCOUNTER — Telehealth: Payer: Self-pay | Admitting: *Deleted

## 2012-10-27 NOTE — Telephone Encounter (Signed)
Advised patient of lab results  

## 2012-10-27 NOTE — Telephone Encounter (Signed)
Message copied by Burnell Blanks on Mon Oct 27, 2012  3:09 PM ------      Message from: Cassell Clement      Created: Sun Oct 26, 2012  1:10 PM       Please report to patient.  The recent labs are stable. Continue same medication and careful diet.

## 2012-11-10 ENCOUNTER — Other Ambulatory Visit: Payer: Self-pay

## 2012-11-10 MED ORDER — LISINOPRIL 10 MG PO TABS: 10.0000 mg | ORAL_TABLET | Freq: Every day | ORAL | Status: DC

## 2012-12-12 DIAGNOSIS — N400 Enlarged prostate without lower urinary tract symptoms: Secondary | ICD-10-CM | POA: Diagnosis not present

## 2013-01-20 DIAGNOSIS — H4011X0 Primary open-angle glaucoma, stage unspecified: Secondary | ICD-10-CM | POA: Diagnosis not present

## 2013-01-20 DIAGNOSIS — H409 Unspecified glaucoma: Secondary | ICD-10-CM | POA: Diagnosis not present

## 2013-02-06 ENCOUNTER — Other Ambulatory Visit: Payer: Self-pay | Admitting: *Deleted

## 2013-02-06 MED ORDER — DOXAZOSIN MESYLATE 4 MG PO TABS: 4.0000 mg | ORAL_TABLET | Freq: Every day | ORAL | Status: DC

## 2013-02-09 ENCOUNTER — Telehealth: Payer: Self-pay | Admitting: *Deleted

## 2013-02-09 MED ORDER — SERTRALINE HCL 50 MG PO TABS: 50.0000 mg | ORAL_TABLET | Freq: Every day | ORAL | Status: DC

## 2013-02-09 NOTE — Telephone Encounter (Signed)
Left message to call back  Refilled Zoloft refilled

## 2013-02-09 NOTE — Telephone Encounter (Signed)
Pt would like a refill on Zoloft. Will route to Dr.Brackbills Nurse

## 2013-02-10 NOTE — Telephone Encounter (Signed)
Advised wife  

## 2013-03-10 ENCOUNTER — Other Ambulatory Visit: Payer: Self-pay | Admitting: *Deleted

## 2013-03-10 MED ORDER — LOVASTATIN 40 MG PO TABS: 40.0000 mg | ORAL_TABLET | Freq: Every day | ORAL | Status: DC

## 2013-03-31 DIAGNOSIS — R5383 Other fatigue: Secondary | ICD-10-CM | POA: Diagnosis not present

## 2013-03-31 DIAGNOSIS — I10 Essential (primary) hypertension: Secondary | ICD-10-CM | POA: Diagnosis not present

## 2013-03-31 DIAGNOSIS — R5381 Other malaise: Secondary | ICD-10-CM | POA: Diagnosis not present

## 2013-03-31 DIAGNOSIS — G479 Sleep disorder, unspecified: Secondary | ICD-10-CM | POA: Diagnosis not present

## 2013-04-16 DIAGNOSIS — G4733 Obstructive sleep apnea (adult) (pediatric): Secondary | ICD-10-CM | POA: Diagnosis not present

## 2013-04-27 ENCOUNTER — Encounter: Payer: Self-pay | Admitting: Cardiology

## 2013-04-27 ENCOUNTER — Ambulatory Visit (INDEPENDENT_AMBULATORY_CARE_PROVIDER_SITE_OTHER): Payer: Medicare Other | Admitting: Cardiology

## 2013-04-27 ENCOUNTER — Other Ambulatory Visit (INDEPENDENT_AMBULATORY_CARE_PROVIDER_SITE_OTHER): Payer: Medicare Other

## 2013-04-27 VITALS — BP 120/72 | HR 60 | Ht 67.0 in | Wt 156.8 lb

## 2013-04-27 DIAGNOSIS — K219 Gastro-esophageal reflux disease without esophagitis: Secondary | ICD-10-CM

## 2013-04-27 DIAGNOSIS — I259 Chronic ischemic heart disease, unspecified: Secondary | ICD-10-CM

## 2013-04-27 DIAGNOSIS — E7800 Pure hypercholesterolemia, unspecified: Secondary | ICD-10-CM

## 2013-04-27 LAB — HEPATIC FUNCTION PANEL
ALT: 15 U/L (ref 0–53)
AST: 17 U/L (ref 0–37)
Alkaline Phosphatase: 44 U/L (ref 39–117)
Bilirubin, Direct: 0.1 mg/dL (ref 0.0–0.3)
Total Bilirubin: 0.8 mg/dL (ref 0.3–1.2)
Total Protein: 6.6 g/dL (ref 6.0–8.3)

## 2013-04-27 LAB — LIPID PANEL
Total CHOL/HDL Ratio: 3
Triglycerides: 114 mg/dL (ref 0.0–149.0)

## 2013-04-27 LAB — BASIC METABOLIC PANEL
BUN: 12 mg/dL (ref 6–23)
Creatinine, Ser: 1 mg/dL (ref 0.4–1.5)
GFR: 73.05 mL/min (ref >60.00)
Glucose, Bld: 91 mg/dL (ref 70–99)
Potassium: 4 mEq/L (ref 3.5–5.1)

## 2013-04-27 NOTE — Assessment & Plan Note (Signed)
The patient has not had any flareup of his dyspepsia.

## 2013-04-27 NOTE — Assessment & Plan Note (Signed)
The patient is not experiencing any angina pectoris or symptoms of CHF
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
## 2013-04-27 NOTE — Progress Notes (Signed)
Quick Note:  Please report to patient. The recent labs are stable. Continue same medication and careful diet. ______ 

## 2013-04-27 NOTE — Assessment & Plan Note (Signed)
The patient is not having any myalgias from the lovastatin 40 mg daily.  We are checking lab work today results pending

## 2013-04-27 NOTE — Patient Instructions (Addendum)
Your physician wants you to follow-up in: 6 MONTHS.  You will receive a reminder letter in the mail two months in advance. If you don't receive a letter, please call our office to schedule the follow-up appointment.  Your physician recommends that you continue on your current medications as directed. Please refer to the Current Medication list given to you today.  

## 2013-05-10 ENCOUNTER — Other Ambulatory Visit: Payer: Self-pay | Admitting: Cardiology

## 2013-06-03 ENCOUNTER — Other Ambulatory Visit: Payer: Self-pay | Admitting: *Deleted

## 2013-06-03 MED ORDER — METOPROLOL TARTRATE 25 MG PO TABS: ORAL_TABLET | ORAL | Status: DC

## 2013-06-03 NOTE — Telephone Encounter (Signed)
Refill sent.

## 2013-06-07 ENCOUNTER — Other Ambulatory Visit: Payer: Self-pay | Admitting: Cardiology

## 2013-07-14 DIAGNOSIS — G4733 Obstructive sleep apnea (adult) (pediatric): Secondary | ICD-10-CM | POA: Diagnosis not present

## 2013-08-17 DIAGNOSIS — H43819 Vitreous degeneration, unspecified eye: Secondary | ICD-10-CM | POA: Diagnosis not present

## 2013-08-27 DIAGNOSIS — H4011X0 Primary open-angle glaucoma, stage unspecified: Secondary | ICD-10-CM | POA: Diagnosis not present

## 2013-08-27 DIAGNOSIS — H409 Unspecified glaucoma: Secondary | ICD-10-CM | POA: Diagnosis not present

## 2013-08-27 DIAGNOSIS — H47239 Glaucomatous optic atrophy, unspecified eye: Secondary | ICD-10-CM | POA: Diagnosis not present

## 2013-09-01 DIAGNOSIS — Z23 Encounter for immunization: Secondary | ICD-10-CM | POA: Diagnosis not present

## 2013-09-10 ENCOUNTER — Other Ambulatory Visit: Payer: Self-pay | Admitting: Cardiology

## 2013-09-10 DIAGNOSIS — L570 Actinic keratosis: Secondary | ICD-10-CM | POA: Diagnosis not present

## 2013-09-10 DIAGNOSIS — D235 Other benign neoplasm of skin of trunk: Secondary | ICD-10-CM | POA: Diagnosis not present

## 2013-10-19 DIAGNOSIS — R197 Diarrhea, unspecified: Secondary | ICD-10-CM | POA: Diagnosis not present

## 2013-10-19 DIAGNOSIS — K219 Gastro-esophageal reflux disease without esophagitis: Secondary | ICD-10-CM | POA: Diagnosis not present

## 2013-10-26 ENCOUNTER — Other Ambulatory Visit: Payer: Medicare Other

## 2013-10-26 ENCOUNTER — Ambulatory Visit (INDEPENDENT_AMBULATORY_CARE_PROVIDER_SITE_OTHER): Payer: Medicare Other | Admitting: Cardiology

## 2013-10-26 ENCOUNTER — Encounter: Payer: Self-pay | Admitting: Cardiology

## 2013-10-26 VITALS — BP 139/56 | HR 55 | Ht 67.0 in | Wt 157.2 lb

## 2013-10-26 DIAGNOSIS — I259 Chronic ischemic heart disease, unspecified: Secondary | ICD-10-CM

## 2013-10-26 DIAGNOSIS — K219 Gastro-esophageal reflux disease without esophagitis: Secondary | ICD-10-CM

## 2013-10-26 DIAGNOSIS — G4730 Sleep apnea, unspecified: Secondary | ICD-10-CM

## 2013-10-26 DIAGNOSIS — E7800 Pure hypercholesterolemia, unspecified: Secondary | ICD-10-CM

## 2013-10-26 LAB — BASIC METABOLIC PANEL
BUN: 13 mg/dL (ref 6–23)
Calcium: 9.1 mg/dL (ref 8.4–10.5)
GFR: 68.39 mL/min (ref >60.00)
Glucose, Bld: 91 mg/dL (ref 70–99)
Potassium: 4.7 mEq/L (ref 3.5–5.1)
Sodium: 138 mEq/L (ref 135–145)

## 2013-10-26 LAB — HEPATIC FUNCTION PANEL
ALT: 16 U/L (ref 0–53)
AST: 17 U/L (ref 0–37)
Total Bilirubin: 0.7 mg/dL (ref 0.3–1.2)
Total Protein: 6.6 g/dL (ref 6.0–8.3)

## 2013-10-26 LAB — LIPID PANEL: VLDL: 16.2 mg/dL (ref 0.0–40.0)

## 2013-10-26 NOTE — Patient Instructions (Signed)
Lab work today     Your physician recommends that you schedule a follow-up appointment in:6 months with fasting lab and a ekg

## 2013-10-26 NOTE — Assessment & Plan Note (Signed)
Patient has a history of hypercholesterolemia.  He is on Crestor and fish oil.  Blood work is pending.  He is not having any problems from the Crestor.
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
## 2013-10-26 NOTE — Assessment & Plan Note (Signed)
The patient has a history of GERD.  His symptoms have been stable.  He has seen his gastroenterologist Dr. Carman Ching earlier this month and his medicines were continued unchanged.  It was felt that his reflux was under good control and he also has a tendency toward mild diarrhea which is not a great problem for him.  His GERD has improved since he started using a CPAP machine at night

## 2013-10-26 NOTE — Assessment & Plan Note (Signed)
The patient exercises 3 days a week at the gym.  He does cardio as well as exercises for his muscles.  He also plays occasional golf.  He is not having any exertional chest pain or other exertional symptoms.

## 2013-10-26 NOTE — Progress Notes (Signed)
Quick Note:  Please report to patient. The recent labs are stable. Continue same medication and careful diet. ______ 

## 2013-11-20 ENCOUNTER — Other Ambulatory Visit: Payer: Self-pay | Admitting: Cardiology

## 2013-12-09 DIAGNOSIS — N400 Enlarged prostate without lower urinary tract symptoms: Secondary | ICD-10-CM | POA: Diagnosis not present

## 2013-12-12 ENCOUNTER — Other Ambulatory Visit: Payer: Self-pay | Admitting: Cardiology

## 2013-12-22 DIAGNOSIS — H4011X0 Primary open-angle glaucoma, stage unspecified: Secondary | ICD-10-CM | POA: Diagnosis not present

## 2013-12-22 DIAGNOSIS — H409 Unspecified glaucoma: Secondary | ICD-10-CM | POA: Diagnosis not present

## 2014-01-11 DIAGNOSIS — Z961 Presence of intraocular lens: Secondary | ICD-10-CM | POA: Diagnosis not present

## 2014-01-11 DIAGNOSIS — H18419 Arcus senilis, unspecified eye: Secondary | ICD-10-CM | POA: Diagnosis not present

## 2014-01-11 DIAGNOSIS — H35379 Puckering of macula, unspecified eye: Secondary | ICD-10-CM | POA: Diagnosis not present

## 2014-01-11 DIAGNOSIS — H4010X0 Unspecified open-angle glaucoma, stage unspecified: Secondary | ICD-10-CM | POA: Diagnosis not present

## 2014-01-11 DIAGNOSIS — H26499 Other secondary cataract, unspecified eye: Secondary | ICD-10-CM | POA: Diagnosis not present

## 2014-01-15 ENCOUNTER — Other Ambulatory Visit: Payer: Self-pay | Admitting: Cardiology

## 2014-01-27 DIAGNOSIS — Z961 Presence of intraocular lens: Secondary | ICD-10-CM | POA: Diagnosis not present

## 2014-01-27 DIAGNOSIS — H26499 Other secondary cataract, unspecified eye: Secondary | ICD-10-CM | POA: Diagnosis not present

## 2014-01-28 DIAGNOSIS — G4733 Obstructive sleep apnea (adult) (pediatric): Secondary | ICD-10-CM | POA: Diagnosis not present

## 2014-01-28 DIAGNOSIS — I10 Essential (primary) hypertension: Secondary | ICD-10-CM | POA: Diagnosis not present

## 2014-02-03 DIAGNOSIS — H43399 Other vitreous opacities, unspecified eye: Secondary | ICD-10-CM | POA: Diagnosis not present

## 2014-02-03 DIAGNOSIS — H43819 Vitreous degeneration, unspecified eye: Secondary | ICD-10-CM | POA: Diagnosis not present

## 2014-02-03 DIAGNOSIS — H43829 Vitreomacular adhesion, unspecified eye: Secondary | ICD-10-CM | POA: Diagnosis not present

## 2014-02-03 DIAGNOSIS — H35379 Puckering of macula, unspecified eye: Secondary | ICD-10-CM | POA: Diagnosis not present

## 2014-02-04 ENCOUNTER — Other Ambulatory Visit: Payer: Self-pay | Admitting: Cardiology

## 2014-02-11 ENCOUNTER — Other Ambulatory Visit: Payer: Self-pay | Admitting: Cardiology

## 2014-02-11 DIAGNOSIS — H5200 Hypermetropia, unspecified eye: Secondary | ICD-10-CM | POA: Diagnosis not present

## 2014-02-11 DIAGNOSIS — H52229 Regular astigmatism, unspecified eye: Secondary | ICD-10-CM | POA: Diagnosis not present

## 2014-02-11 DIAGNOSIS — H35359 Cystoid macular degeneration, unspecified eye: Secondary | ICD-10-CM | POA: Diagnosis not present

## 2014-03-08 ENCOUNTER — Other Ambulatory Visit: Payer: Self-pay | Admitting: Cardiology

## 2014-04-28 ENCOUNTER — Encounter: Payer: Self-pay | Admitting: Cardiovascular Disease

## 2014-04-28 ENCOUNTER — Encounter: Payer: Self-pay | Admitting: Cardiology

## 2014-04-28 ENCOUNTER — Other Ambulatory Visit: Payer: Medicare Other

## 2014-04-28 ENCOUNTER — Ambulatory Visit (INDEPENDENT_AMBULATORY_CARE_PROVIDER_SITE_OTHER): Payer: Medicare Other | Admitting: Cardiology

## 2014-04-28 VITALS — BP 126/80 | HR 79 | Ht 67.0 in | Wt 160.0 lb

## 2014-04-28 DIAGNOSIS — G4730 Sleep apnea, unspecified: Secondary | ICD-10-CM

## 2014-04-28 DIAGNOSIS — I482 Chronic atrial fibrillation, unspecified: Secondary | ICD-10-CM | POA: Insufficient documentation

## 2014-04-28 DIAGNOSIS — K4090 Unilateral inguinal hernia, without obstruction or gangrene, not specified as recurrent: Secondary | ICD-10-CM | POA: Insufficient documentation

## 2014-04-28 DIAGNOSIS — E7800 Pure hypercholesterolemia, unspecified: Secondary | ICD-10-CM

## 2014-04-28 DIAGNOSIS — I259 Chronic ischemic heart disease, unspecified: Secondary | ICD-10-CM | POA: Diagnosis not present

## 2014-04-28 DIAGNOSIS — I4891 Unspecified atrial fibrillation: Secondary | ICD-10-CM | POA: Diagnosis not present

## 2014-04-28 DIAGNOSIS — K219 Gastro-esophageal reflux disease without esophagitis: Secondary | ICD-10-CM

## 2014-04-28 LAB — CBC WITH DIFFERENTIAL/PLATELET
BASOS PCT: 0.5 % (ref 0.0–3.0)
Basophils Absolute: 0 10*3/uL (ref 0.0–0.1)
EOS ABS: 0.1 10*3/uL (ref 0.0–0.7)
Eosinophils Relative: 1.5 % (ref 0.0–5.0)
HCT: 44.2 % (ref 39.0–52.0)
HEMOGLOBIN: 14.8 g/dL (ref 13.0–17.0)
LYMPHS ABS: 1.5 10*3/uL (ref 0.7–4.0)
Lymphocytes Relative: 21 % (ref 12.0–46.0)
MCHC: 33.5 g/dL (ref 30.0–36.0)
MCV: 97.6 fl (ref 78.0–100.0)
MONO ABS: 0.4 10*3/uL (ref 0.1–1.0)
Monocytes Relative: 6.2 % (ref 3.0–12.0)
NEUTROS ABS: 5 10*3/uL (ref 1.4–7.7)
Neutrophils Relative %: 70.8 % (ref 43.0–77.0)
Platelets: 165 10*3/uL (ref 150.0–400.0)
RBC: 4.53 Mil/uL (ref 4.22–5.81)
RDW: 13.5 % (ref 11.5–15.5)
WBC: 7 10*3/uL (ref 4.0–10.5)

## 2014-04-28 LAB — LIPID PANEL
CHOL/HDL RATIO: 3
Cholesterol: 143 mg/dL (ref 0–200)
HDL: 49.4 mg/dL (ref >39.00)
LDL Cholesterol: 77 mg/dL (ref 0–99)
NonHDL: 93.6
Triglycerides: 81 mg/dL (ref 0.0–149.0)
VLDL: 16.2 mg/dL (ref 0.0–40.0)

## 2014-04-28 LAB — BASIC METABOLIC PANEL
BUN: 16 mg/dL (ref 6–23)
CO2: 28 meq/L (ref 19–32)
Calcium: 9 mg/dL (ref 8.4–10.5)
Chloride: 105 mEq/L (ref 96–112)
Creatinine, Ser: 1.2 mg/dL (ref 0.4–1.5)
GFR: 63.61 mL/min (ref >60.00)
GLUCOSE: 93 mg/dL (ref 70–99)
Potassium: 4.3 mEq/L (ref 3.5–5.1)
SODIUM: 139 meq/L (ref 135–145)

## 2014-04-28 LAB — HEPATIC FUNCTION PANEL
ALBUMIN: 3.8 g/dL (ref 3.5–5.2)
ALT: 17 U/L (ref 0–53)
AST: 16 U/L (ref 0–37)
Alkaline Phosphatase: 44 U/L (ref 39–117)
BILIRUBIN TOTAL: 0.7 mg/dL (ref 0.2–1.2)
Bilirubin, Direct: 0.1 mg/dL (ref 0.0–0.3)
Total Protein: 6.1 g/dL (ref 6.0–8.3)

## 2014-04-28 LAB — TSH: TSH: 1.36 u[IU]/mL (ref 0.35–4.50)

## 2014-04-28 LAB — T4, FREE: Free T4: 0.75 ng/dL (ref 0.60–1.60)

## 2014-04-28 MED ORDER — APIXABAN 5 MG PO TABS: 5.0000 mg | ORAL_TABLET | Freq: Two times a day (BID) | ORAL | Status: DC

## 2014-04-28 NOTE — Progress Notes (Signed)
Henry Ayala Date of Birth:  12/08/1930 [place] [address] [place], [place]  [address] [phone]        Fax   [phone]   History of Present Illness: This pleasant 80 year old gentleman is seen for a six-month followup office visit. He has a history of ischemic heart disease. He had an anterior wall myocardial infarction in 1992 and had angioplasty of his LAD. He had an inferior wall myocardial infarction in 2004 and had angioplasty with stenting of a proximal right coronary artery occlusion. He has not been experiencing any recurrent chest pain. His last nuclear stress test was several years ago  and showed an old inferoapical scar and an old anteroseptal scar but no reversible ischemia. His ejection fraction was 40%. He is not having any symptoms of angina or congestive heart failure. . He has been doing a lot of yard work and has muscle soreness but no side effects from his statin therapy. The patient has been diagnosed as having sleep apnea and uses a CPAP machine and has been sleeping well. He does not have any prior history of atrial fibrillation.  He has not been aware of any racing of his heart or been aware of his new heart rhythm of atrial fibrillation.  The duration of his atrial fibrillation is not known therefore.  EKG today shows atrial fibrillation with controlled ventricular response.   Current Outpatient Prescriptions  Medication Sig Dispense Refill  . aspirin 81 MG tablet Take 81 mg by mouth daily.        . bimatoprost (LUMIGAN) 0.03 % ophthalmic solution Place 1 drop into both eyes at bedtime.        . cholecalciferol (VITAMIN D) 1000 UNITS tablet Take 1,000 Units by mouth daily.        . clopidogrel (PLAVIX) 75 MG tablet TAKE ONE TABLET BY MOUTH DAILY  30 tablet  0  . doxazosin (CARDURA) 4 MG tablet TAKE ONE TABLET BY MOUTH ONCE DAILY.  90 tablet  0  . Esomeprazole Magnesium (NEXIUM PO) Take by mouth daily.       [name] lisinopril  (PRINIVIL,ZESTRIL) 10 MG tablet TAKE ONE TABLET BY MOUTH DAILY.  90 tablet  1  . lovastatin (MEVACOR) 40 MG tablet TAKE ONE TABLET BY MOUTH ONCE DAILY  90 tablet  0  . metoprolol tartrate (LOPRESSOR) 25 MG tablet TAKE ONE-HALF TABLET BY MOUTH DAILY OR AS DIRECTED  90 tablet  3  . Omega-3 Fatty Acids (OMEGA 3 PO) Take 1 capsule by mouth daily.        . sertraline (ZOLOFT) 50 MG tablet TAKE ONE TABLET BY MOUTH ONCE DAILY  90 tablet  3  . apixaban (ELIQUIS) 5 MG TABS tablet Take 1 tablet (5 mg total) by mouth 2 (two) times daily.  60 tablet  6   No current facility-administered medications for this visit.    Allergies  Allergen Reactions  . Iodinated Diagnostic Agents   . Lipitor [Atorvastatin Calcium]   . Sulfa Drugs Cross Reactors     Patient Active Problem List   Diagnosis Date Noted  . Atrial fibrillation 04/28/2014  . Inguinal hernia 04/28/2014  . Sleep apnea 10/26/2013  . Ischemic heart disease 04/27/2011  . Hypercholesterolemia 04/27/2011  . GERD (gastroesophageal reflux disease) 04/27/2011  . BPH (benign prostatic hyperplasia) 04/27/2011    History  Smoking status  . Never Smoker   Smokeless tobacco  . Never Used    History  Alcohol  Use No    Family History  Problem Relation Age of Onset  . Heart attack Father   . Stroke Mother   . Heart attack Brother   . Cancer Sister     colon cancer  . Coronary artery disease Brother     has had multiple coronary stents placed    Review of Systems: Constitutional: no fever chills diaphoresis or fatigue or change in weight.  Head and neck: no hearing loss, no epistaxis, no photophobia or visual disturbance. Respiratory: No cough, shortness of breath or wheezing. Cardiovascular: No chest pain peripheral edema, palpitations. Gastrointestinal: No abdominal distention, no abdominal pain, no change in bowel habits hematochezia or melena. Genitourinary: No dysuria, no frequency, no urgency, no nocturia. Musculoskeletal:No  arthralgias, no back pain, no gait disturbance or myalgias. Neurological: No dizziness, no headaches, no numbness, no seizures, no syncope, no weakness, no tremors. Hematologic: No lymphadenopathy, no easy bruising. Psychiatric: No confusion, no hallucinations, no sleep disturbance.    Physical Exam: Filed Vitals:   04/28/14 0855  BP: 126/80  Pulse: 79   the general appearance reveals a well-developed well-nourished gentleman in no distress.  His weight is up 3 pounds since last visit.The head and neck exam reveals pupils equal and reactive.  Extraocular movements are full.  There is no scleral icterus.  The mouth and pharynx are normal.  The neck is supple.  The carotids reveal no bruits.  The jugular venous pressure is normal.  The  thyroid is not enlarged.  There is no lymphadenopathy.  The chest is clear to percussion and auscultation.  There are no rales or rhonchi.  Expansion of the chest is symmetrical.  The precordium is quiet.  The pulse is irregularly irregular  The first heart sound is normal.  The second heart sound is physiologically split.  There is no murmur gallop rub or click.  There is no abnormal lift or heave.  The abdomen is soft and nontender.  The bowel sounds are normal.  The liver and spleen are not enlarged.  There are no abdominal masses.  There are no abdominal bruits.  Extremities reveal good pedal pulses.  There is no phlebitis or edema.  There is no cyanosis or clubbing.  Strength is normal and symmetrical in all extremities.  There is no lateralizing weakness.  There are no sensory deficits.  The skin is warm and dry.  There is no rash.   EKG shows atrial fibrillation, new since 10/24/12, and there is evidence of old inferior wall MI and old anterior wall MI.  Assessment / Plan: 1. atrial fibrillation, uncertain duration, new since previous office visit 6 months ago. 2. ischemic heart disease status post stents to LAD and stents to right coronary artery in 1992 and  in 2004 respectively. 3. Hypercholesterolemia 4. GERD 5. sleep apnea and uses CPAP machine 6. right inguinal hernia  Plan: Continue metoprolol for rate control and we are starting apixaban 5 mg twice a day for effective anticoagulation.  As he starts apixaban we will stop aspirin and Plavix.  We will check thyroid function studies and CBC today as well as usual fasting labs.  We will get a two-dimensional echocardiogram to evaluate LV systolic function and left atrial size.  We gave him information about atrial fibrillation.  We will plan to see him again in 2 months for office visit and EKG.  Depending on results of echocardiogram we can discuss possible elective cardioversion after his next visit but we did not discuss  cardioversion today.  Since he is essentially asymptomatic we may elect to continue with rate control and anticoagulation.

## 2014-04-28 NOTE — Assessment & Plan Note (Signed)
The patient takes daily Nexium.  He has occasional flares of his GERD and takes extra antacid

## 2014-04-28 NOTE — Assessment & Plan Note (Signed)
Atrial fibrillation is a new rhythm for him.  No prior history.  The duration is unknown.  In retrospect he thinks he may have been a little more fatigued over the past several weeks but is not sure about this.  He has not had any TIA or stroke symptoms.  Up until now he has been on dual antiplatelet therapy because of his remote stents.

## 2014-04-28 NOTE — Patient Instructions (Signed)
Your physician has recommended you make the following change in your medication:   1).  STOP ASPIRIN 2.) STOP PLAVIX 3.) START ELIQUIS ( 5 MG ) TWICE A DAY  Will obtain labs today: cbc/tsh/Ft4/lipid/lft/bmet  Your physician has requested that you have an echocardiogram. Echocardiography is a painless test that uses sound waves to create images of your heart. It provides your doctor with information about the size and shape of your heart and how well your heart's chambers and valves are working. This procedure takes approximately one hour. There are no restrictions for this procedure.  Your physician recommends that you KEEP YOUR scheduled a follow-up appointment in August with Dr. Mare Ferrari   Atrial Fibrillation Atrial fibrillation is a type of irregular heart rhythm (arrhythmia). During atrial fibrillation, the upper chambers of the heart (atria) quiver continuously in a chaotic pattern. This causes an irregular and often rapid heart rate.  Atrial fibrillation is the result of the heart becoming overloaded with disorganized signals that tell it to beat. These signals are normally released one at a time by a part of the right atrium called the sinoatrial node. They then travel from the atria to the lower chambers of the heart (ventricles), causing the atria and ventricles to contract and pump blood as they pass. In atrial fibrillation, parts of the atria outside of the sinoatrial node also release these signals. This results in two problems. First, the atria receive so many signals that they do not have time to fully contract. Second, the ventricles, which can only receive one signal at a time, beat irregularly and out of rhythm with the atria.  There are three types of atrial fibrillation:   Paroxysmal. Paroxysmal atrial fibrillation starts suddenly and stops on its own within a week.  Persistent. Persistent atrial fibrillation lasts for more than a week. It may stop on its own or with  treatment.  Permanent. Permanent atrial fibrillation does not go away. Episodes of atrial fibrillation may lead to permanent atrial fibrillation. Atrial fibrillation can prevent your heart from pumping blood normally. It increases your risk of stroke and can lead to heart failure.  CAUSES   Heart conditions, including a heart attack, heart failure, coronary artery disease, and heart valve conditions.   Inflammation of the sac that surrounds the heart (pericarditis).  Blockage of an artery in the lungs (pulmonary embolism).  Pneumonia or other infections.  Chronic lung disease.  Thyroid problems, especially if the thyroid is overactive (hyperthyroidism).  Caffeine, excessive alcohol use, and use of some illegal drugs.   Use of some medicines, including certain decongestants and diet pills.  Heart surgery.   Birth defects.  Sometimes, no cause can be found. When this happens, the atrial fibrillation is called lone atrial fibrillation. The risk of complications from atrial fibrillation increases if you have lone atrial fibrillation and you are age 42 years or older. RISK FACTORS  Heart failure.  Coronary artery disease.  Diabetes mellitus.   High blood pressure (hypertension).   Obesity.   Other arrhythmias.   Increased age. SYMPTOMS   A feeling that your heart is beating rapidly or irregularly.   A feeling of discomfort or pain in your chest.   Shortness of breath.   Sudden light-headedness or weakness.   Getting tired easily when exercising.   Urinating more often than normal (mainly when atrial fibrillation first begins).  In paroxysmal atrial fibrillation, symptoms may start and suddenly stop. DIAGNOSIS  Your health care provider may be able to detect  atrial fibrillation when taking your pulse. Your health care provider may have you take a test called an ambulatory electrocardiogram (ECG). An ECG records your heartbeat patterns over a 24-hour  period. You may also have other tests, such as:  Transthoracic echocardiogram (TTE). During echocardiography, sound waves are used to evaluate how blood flows through your heart.  Transesophageal echocardiogram (TEE).  Stress test. There is more than one type of stress test. If a stress test is needed, ask your health care provider about which type is best for you.   Chest X-ray exam.  Blood tests.  Computed tomography (CT). TREATMENT  Treatment may include:  Treating any underlying conditions. For example, if you have an overactive thyroid, treating the condition may correct atrial fibrillation.  Taking medicine. Medicines may be given to control a rapid heart rate or to prevent blood clots, heart failure, or a stroke.  Having a procedure to correct the rhythm of the heart:  Electrical cardioversion. During electrical cardioversion, a controlled, low-energy shock is delivered to the heart through your skin. If you have chest pain, very low blood pressure, or sudden heart failure, this procedure may need to be done as an emergency.  Catheter ablation. During this procedure, heart tissues that send the signals that cause atrial fibrillation are destroyed.  Maze or minimaze procedure. During this surgery, thin lines of heart tissue that carry the abnormal signals are destroyed. The maze procedure is an open-heart surgery. The minimaze procedure is a minimally invasive surgery. This means that small cuts are made to access the heart instead of a large opening.  Pulmonary venous isolation. During this surgery, tissue around the veins that carry blood from the lungs (pulmonary veins) is destroyed. This tissue is thought to carry the abnormal signals. HOME CARE INSTRUCTIONS   Only take medicines that your health care provider approves, and take them as directed. Some medicines can make atrial fibrillation worse or recur.  If blood thinners were prescribed by your health care provider,  take them exactly as directed. Too much blood-thinning medicine can cause bleeding. If you take too little, you will not have the needed protection against stroke and other problems.  Perform blood tests at home if directed by your health care provider. Perform blood tests exactly as directed.  Quit smoking if you smoke.  Do not drink alcohol.  Do not drink caffeinated beverages such as coffee, soda, and some teas. You may drink decaffeinated coffee, soda, or tea.   Maintain a healthy weight.Do not use diet pills unless your health care provider approves. They may make heart problems worse.   Follow diet instructions as directed by your health care provider.  Exercise regularly as directed by your health care provider.  Keep all follow-up appointments with your health care provider. PREVENTION  The following substances can cause atrial fibrillation to recur:   Caffeinated beverages.  Alcohol.  Certain medicines, especially those used for breathing problems.  Certain herbs and herbal medicines, such as those containing ephedra or ginseng.  Illegal drugs, such as cocaine and amphetamines. Sometimes medicines are given to prevent atrial fibrillation from recurring. Proper treatment of any underlying condition is also important in helping prevent recurrence.  SEEK MEDICAL CARE IF:  You notice a change in the rate, rhythm, or strength of your heartbeat.  You suddenly begin urinating more frequently.  You tire more easily when exerting yourself or exercising. SEEK IMMEDIATE MEDICAL CARE IF:   You have chest pain, abdominal pain, sweating, or weakness.  You feel nauseous.  You have shortness of breath.  You suddenly have swollen feet and ankles.  You feel dizzy.  Your face or limbs feel numb or weak.  You have a change in your vision or speech. MAKE SURE YOU:   Understand these instructions.  Will watch your condition.  Will get help right away if you are not  doing well or get worse. Document Released: 10/29/2005 Document Revised: 11/03/2013 Document Reviewed: 12/09/2012 [facility] Patient Information 2015 [place], [place]. This information is not intended to replace advice given to you by your health care provider. Make sure you discuss any questions you have with your health care provider.

## 2014-04-28 NOTE — Assessment & Plan Note (Signed)
The patient has a moderate-sized right inguinal hernia.  He would be interested in surgical repair at some convenient time.  We will discuss further at next office visit.  He has had the left side repaired in the past.  He cannot remember his surgeon's name.

## 2014-04-28 NOTE — Assessment & Plan Note (Signed)
The patient has not been experiencing any recurrent chest pain or angina.  He continues to work out at [employer] regularly.

## 2014-05-03 ENCOUNTER — Telehealth: Payer: Self-pay | Admitting: *Deleted

## 2014-05-03 NOTE — Telephone Encounter (Signed)
PA to [facility] for eliquis 

## 2014-05-04 NOTE — Telephone Encounter (Signed)
[facility] approved eliquis through 05/03/2015

## 2014-05-04 NOTE — Telephone Encounter (Signed)
Pharmacy notified.

## 2014-05-19 ENCOUNTER — Other Ambulatory Visit: Payer: Self-pay | Admitting: Cardiology

## 2014-05-21 ENCOUNTER — Ambulatory Visit (HOSPITAL_COMMUNITY): Payer: Medicare Other | Attending: Internal Medicine | Admitting: Radiology

## 2014-05-21 ENCOUNTER — Other Ambulatory Visit (HOSPITAL_COMMUNITY): Payer: Self-pay | Admitting: Cardiology

## 2014-05-21 DIAGNOSIS — I4891 Unspecified atrial fibrillation: Secondary | ICD-10-CM

## 2014-05-21 DIAGNOSIS — E7800 Pure hypercholesterolemia, unspecified: Secondary | ICD-10-CM | POA: Insufficient documentation

## 2014-05-21 DIAGNOSIS — I2589 Other forms of chronic ischemic heart disease: Secondary | ICD-10-CM | POA: Insufficient documentation

## 2014-05-21 DIAGNOSIS — I252 Old myocardial infarction: Secondary | ICD-10-CM | POA: Insufficient documentation

## 2014-05-21 DIAGNOSIS — I079 Rheumatic tricuspid valve disease, unspecified: Secondary | ICD-10-CM | POA: Diagnosis not present

## 2014-05-21 DIAGNOSIS — I059 Rheumatic mitral valve disease, unspecified: Secondary | ICD-10-CM | POA: Insufficient documentation

## 2014-05-21 DIAGNOSIS — G4733 Obstructive sleep apnea (adult) (pediatric): Secondary | ICD-10-CM | POA: Insufficient documentation

## 2014-05-21 NOTE — Progress Notes (Signed)
Echocardiogram performed.  

## 2014-05-28 ENCOUNTER — Telehealth: Payer: Self-pay | Admitting: Cardiology

## 2014-05-28 NOTE — Telephone Encounter (Signed)
New problem ° ° ° °Pt returning a call concerning lab results. Please call pt. °

## 2014-05-28 NOTE — Telephone Encounter (Signed)
Advised patient, keep August appointment

## 2014-05-28 NOTE — Telephone Encounter (Signed)
Message copied by Earvin Hansen on Fri May 28, 2014  1:45 PM ------      Message from: Darlin Coco      Created: Mon May 24, 2014  2:20 PM       Please report.  The echocardiogram shows evidence of his old coronary artery disease with mild inferior wall hypokinesis.  Ejection fraction is slightly low at 48%.  There is mild mitral regurgitation.  The left atrium is enlarged.  Continue current medication.  After he has been on suitable anticoagulation for about a month we will want to talk with him about elective cardioversion. ------

## 2014-05-31 ENCOUNTER — Telehealth: Payer: Self-pay | Admitting: Cardiology

## 2014-05-31 NOTE — Telephone Encounter (Signed)
Agree with advice given

## 2014-05-31 NOTE — Telephone Encounter (Signed)
°  Patient takes eliqus and he has a slight fever and wants to know what he can take. Please call and advise.

## 2014-05-31 NOTE — Telephone Encounter (Signed)
Advised patient ok to take Tylenol. Patient denies any symptoms, advised if no better to call back or go to Urgent Care.

## 2014-06-17 ENCOUNTER — Other Ambulatory Visit: Payer: Self-pay | Admitting: *Deleted

## 2014-06-17 MED ORDER — LOVASTATIN 40 MG PO TABS: ORAL_TABLET | ORAL | Status: DC

## 2014-06-28 ENCOUNTER — Ambulatory Visit (INDEPENDENT_AMBULATORY_CARE_PROVIDER_SITE_OTHER): Payer: Medicare Other | Admitting: Cardiology

## 2014-06-28 ENCOUNTER — Encounter: Payer: Self-pay | Admitting: Cardiology

## 2014-06-28 VITALS — BP 126/80 | HR 80 | Ht 67.0 in | Wt 158.4 lb

## 2014-06-28 DIAGNOSIS — I4819 Other persistent atrial fibrillation: Secondary | ICD-10-CM

## 2014-06-28 DIAGNOSIS — I4891 Unspecified atrial fibrillation: Secondary | ICD-10-CM

## 2014-06-28 DIAGNOSIS — G4730 Sleep apnea, unspecified: Secondary | ICD-10-CM

## 2014-06-28 DIAGNOSIS — I259 Chronic ischemic heart disease, unspecified: Secondary | ICD-10-CM

## 2014-06-28 DIAGNOSIS — E7800 Pure hypercholesterolemia, unspecified: Secondary | ICD-10-CM | POA: Diagnosis not present

## 2014-06-28 DIAGNOSIS — Z01810 Encounter for preprocedural cardiovascular examination: Secondary | ICD-10-CM | POA: Diagnosis not present

## 2014-06-28 DIAGNOSIS — K219 Gastro-esophageal reflux disease without esophagitis: Secondary | ICD-10-CM

## 2014-06-28 LAB — CBC WITH DIFFERENTIAL/PLATELET
BASOS ABS: 0 10*3/uL (ref 0.0–0.1)
BASOS PCT: 0.5 % (ref 0.0–3.0)
EOS ABS: 0.1 10*3/uL (ref 0.0–0.7)
Eosinophils Relative: 1.9 % (ref 0.0–5.0)
HCT: 44 % (ref 39.0–52.0)
HEMOGLOBIN: 14.8 g/dL (ref 13.0–17.0)
Lymphocytes Relative: 26.9 % (ref 12.0–46.0)
Lymphs Abs: 1.7 10*3/uL (ref 0.7–4.0)
MCHC: 33.6 g/dL (ref 30.0–36.0)
MCV: 97.3 fl (ref 78.0–100.0)
MONO ABS: 0.4 10*3/uL (ref 0.1–1.0)
Monocytes Relative: 6.4 % (ref 3.0–12.0)
NEUTROS ABS: 4 10*3/uL (ref 1.4–7.7)
Neutrophils Relative %: 64.3 % (ref 43.0–77.0)
Platelets: 143 10*3/uL — ABNORMAL LOW (ref 150.0–400.0)
RBC: 4.52 Mil/uL (ref 4.22–5.81)
RDW: 14 % (ref 11.5–15.5)
WBC: 6.3 10*3/uL (ref 4.0–10.5)

## 2014-06-28 LAB — BASIC METABOLIC PANEL
BUN: 15 mg/dL (ref 6–23)
CALCIUM: 8.8 mg/dL (ref 8.4–10.5)
CO2: 29 mEq/L (ref 19–32)
Chloride: 105 mEq/L (ref 96–112)
Creatinine, Ser: 1.2 mg/dL (ref 0.4–1.5)
GFR: 62.96 mL/min (ref >60.00)
Glucose, Bld: 84 mg/dL (ref 70–99)
Potassium: 4.4 mEq/L (ref 3.5–5.1)
Sodium: 140 mEq/L (ref 135–145)

## 2014-06-28 LAB — PROTIME-INR
INR: 1.1 ratio — AB (ref 0.8–1.0)
PROTHROMBIN TIME: 12.1 s (ref 9.6–13.1)

## 2014-06-28 NOTE — Progress Notes (Signed)
[name] Date of Birth:  07-Aug-1931 [place] [address] [place], [place]  [address] [phone]        Fax   [phone]   History of Present Illness: This pleasant 80 year old gentleman is seen for a six-month followup office visit. He has a history of ischemic heart disease. He had an anterior wall myocardial infarction in 1992 and had angioplasty of his LAD. He had an inferior wall myocardial infarction in 2004 and had angioplasty with stenting of a proximal right coronary artery occlusion. He has not been experiencing any recurrent chest pain. His last nuclear stress test was several years ago  and showed an old inferoapical scar and an old anteroseptal scar but no reversible ischemia. His ejection fraction was 40%. He is not having any symptoms of angina or congestive heart failure. . He has been doing a lot of yard work and has muscle soreness but no side effects from his statin therapy. The patient has been diagnosed as having sleep apnea and uses a CPAP machine and has been sleeping well.  At his last office visit 04/28/14 he was found to be in atrial fibrillation.  He was not aware of his rhythm.  Since then he has been on apixaban 5 mg twice a day.  He has not missed any doses.  He comes in now to discuss possible elective cardioversion.  He has not had any TIA or stroke symptoms.   Current Outpatient Prescriptions  Medication Sig Dispense Refill  . apixaban (ELIQUIS) 5 MG TABS tablet Take 1 tablet (5 mg total) by mouth 2 (two) times daily.  60 tablet  6  . bimatoprost (LUMIGAN) 0.03 % ophthalmic solution Place 1 drop into both eyes at bedtime.        . cholecalciferol (VITAMIN D) 1000 UNITS tablet Take 1,000 Units by mouth daily.        [name] doxazosin (CARDURA) 4 MG tablet TAKE ONE TABLET BY MOUTH ONCE DAILY  90 tablet  0  . Esomeprazole Magnesium (NEXIUM PO) Take by mouth daily.       [name] lisinopril (PRINIVIL,ZESTRIL) 10 MG tablet TAKE ONE TABLET BY  MOUTH ONCE DAILY  90 tablet  0  . lovastatin (MEVACOR) 40 MG tablet TAKE ONE TABLET BY MOUTH ONCE DAILY  90 tablet  0  . metoprolol tartrate (LOPRESSOR) 25 MG tablet TAKE ONE-HALF TABLET BY MOUTH DAILY OR AS DIRECTED  90 tablet  3  . Omega-3 Fatty Acids (OMEGA 3 PO) Take 1 capsule by mouth daily.        . sertraline (ZOLOFT) 50 MG tablet TAKE ONE TABLET BY MOUTH ONCE DAILY  90 tablet  3  . aspirin 81 MG tablet Take 81 mg by mouth daily.        . clopidogrel (PLAVIX) 75 MG tablet TAKE ONE TABLET BY MOUTH DAILY  30 tablet  0   No current facility-administered medications for this visit.    Allergies  Allergen Reactions  . Iodinated Diagnostic Agents   . Lipitor [Atorvastatin Calcium]   . Sulfa Drugs Cross Reactors     Patient Active Problem List   Diagnosis Date Noted  . Atrial fibrillation 04/28/2014  . Inguinal hernia 04/28/2014  . Sleep apnea 10/26/2013  . Ischemic heart disease 04/27/2011  . Hypercholesterolemia 04/27/2011  . GERD (gastroesophageal reflux disease) 04/27/2011  . BPH (benign prostatic hyperplasia) 04/27/2011    History  Smoking status  . Never Smoker   Smokeless tobacco  .  Never Used    History  Alcohol Use No    Family History  Problem Relation Age of Onset  . Heart attack Father   . Stroke Mother   . Heart attack Brother   . Cancer Sister     colon cancer  . Coronary artery disease Brother     has had multiple coronary stents placed    Review of Systems: Constitutional: no fever chills diaphoresis or fatigue or change in weight.  Head and neck: no hearing loss, no epistaxis, no photophobia or visual disturbance. Respiratory: No cough, shortness of breath or wheezing. Cardiovascular: No chest pain peripheral edema, palpitations. Gastrointestinal: No abdominal distention, no abdominal pain, no change in bowel habits hematochezia or melena. Genitourinary: No dysuria, no frequency, no urgency, no nocturia. Musculoskeletal:No arthralgias, no  back pain, no gait disturbance or myalgias. Neurological: No dizziness, no headaches, no numbness, no seizures, no syncope, no weakness, no tremors. Hematologic: No lymphadenopathy, no easy bruising. Psychiatric: No confusion, no hallucinations, no sleep disturbance.    Physical Exam: Filed Vitals:   06/28/14 0842  BP: 126/80  Pulse: 80   the general appearance reveals an elderly gentleman in no acute distress.The head and neck exam reveals pupils equal and reactive.  Extraocular movements are full.  There is no scleral icterus.  The mouth and pharynx are normal.  The neck is supple.  The carotids reveal no bruits.  The jugular venous pressure is normal.  The  thyroid is not enlarged.  There is no lymphadenopathy.  The chest is clear to percussion and auscultation.  There are no rales or rhonchi.  Expansion of the chest is symmetrical.  The precordium is quiet.  The pulse is irregular.  The first heart sound is normal.  The second heart sound is physiologically split.  There is no murmur gallop rub or click.  There is no abnormal lift or heave.  The abdomen is soft and nontender.  The bowel sounds are normal.  The liver and spleen are not enlarged.  There is history of right inguinal hernia not examined today.  There are no abdominal bruits.  Extremities reveal good pedal pulses.  There is no phlebitis or edema.  There is no cyanosis or clubbing.  Strength is normal and symmetrical in all extremities.  There is no lateralizing weakness.  There are no sensory deficits.  The skin is warm and dry.  There is no rash.  EKG shows atrial fibrillation with controlled ventricular response.  There is a pattern of an old inferior wall MI and old anterior wall MI.     Assessment / Plan: 1. ischemic heart disease status post anterior wall MI in 1992 with angioplasty of the LAD, and status post inferior wall MI with in 2004 with stenting of his proximal right coronary artery.  Last nuclear stress test several  years ago showing old inferoapical scar and old anteroseptal scar but no reversible ischemia and his ejection fraction was 40%. 2. atrial fibrillation with controlled ventricular response.  On apixaban 3. Hypercholesterolemia 4. GERD 5. sleep apnea and uses CPAP machine 6. right inguinal hernia  Plan: Direct-current cardioversion has been scheduled for Wednesday, August 19 at 10 AM. We will plan to see him back in the office in followup for office visit and EKG in approximately one week following the cardioversion.

## 2014-06-28 NOTE — Patient Instructions (Signed)
**Note De-Identified  Obfuscation** Your physician recommends that you continue on your current medications as directed. Please refer to the Current Medication list given to you today.  Your physician has recommended that you have a Cardioversion (DCCV). Electrical Cardioversion uses a jolt of electricity to your heart either through paddles or wired patches attached to your chest. This is a controlled, usually prescheduled, procedure. Defibrillation is done under light anesthesia in the hospital, and you usually go home the day of the procedure. This is done to get your heart back into a normal rhythm. You are not awake for the procedure. Please see the instruction sheet given to you today.  Your physician recommends that you return for lab work in: today   Your physician recommends that you schedule a follow-up appointment in: 1 week after cardioversion

## 2014-06-28 NOTE — Assessment & Plan Note (Signed)
The patient has not been having any angina pectoris.  Activity level has been stable.

## 2014-06-28 NOTE — Assessment & Plan Note (Signed)
The patient is not been having any flareup of his GERD.  No hematochezia or melena.

## 2014-06-28 NOTE — Assessment & Plan Note (Signed)
His atrial fibrillation was discovered at a routine office visit in June 2015.  He had an echocardiogram 05/21/14 which showed ejection fraction of 48% with mild mitral regurgitation.  Left atrial enlargement was present. He has not had any problems from his apixaban. He has not missed any doses. We will set him up for elective DC CV on Wednesday, August 19 at 10 AM.  He will be n.p.o. that morning except for taking apixaban and his metoprolol.

## 2014-06-28 NOTE — Assessment & Plan Note (Signed)
The patient has sleep apnea and has been successfully using his CPAP machine

## 2014-06-29 NOTE — Anesthesia Preprocedure Evaluation (Addendum)
Anesthesia Evaluation  Patient identified by MRN, date of birth, ID band Patient awake    Reviewed: Allergy & Precautions, H&P , NPO status , Patient's Chart, lab work & pertinent test results, reviewed documented beta blocker date and time   Airway Mallampati: II TM Distance: >3 FB Neck ROM: Full    Dental no notable dental hx. (+) Teeth Intact, Dental Advisory Given   Pulmonary sleep apnea ,  breath sounds clear to auscultation  Pulmonary exam normal       Cardiovascular hypertension, Pt. on medications and Pt. on home beta blockers + Past MI Rhythm:Irregular Rate:Normal  Echo 05/2014 - [name] 48% by Simpson&'s, mild inferior hypokinesis, severe LAE,  mild MR, trace to mild TR, normal RVSP.   Neuro/Psych negative neurological ROS  negative psych ROS   GI/Hepatic Neg liver ROS, hiatal hernia, GERD-  ,  Endo/Other  negative endocrine ROS  Renal/GU Renal disease     Musculoskeletal negative musculoskeletal ROS (+)   Abdominal   Peds  Hematology negative hematology ROS (+)   Anesthesia Other Findings   Reproductive/Obstetrics                         Anesthesia Physical Anesthesia Plan  ASA: III  Anesthesia Plan: General   Post-op Pain Management:    Induction: Intravenous  Airway Management Planned:   Additional Equipment:   Intra-op Plan:   Post-operative Plan:   Informed Consent: I have reviewed the patients History and Physical, chart, labs and discussed the procedure including the risks, benefits and alternatives for the proposed anesthesia with the patient or authorized representative who has indicated his/her understanding and acceptance.   Dental advisory given  Plan Discussed with: CRNA, Anesthesiologist and Surgeon  Anesthesia Plan Comments:       Anesthesia Quick Evaluation

## 2014-06-30 ENCOUNTER — Encounter (HOSPITAL_COMMUNITY)
Admission: RE | Disposition: A | Discharge status: Home / Self Care | Payer: Medicare Other | Source: Ambulatory Visit | Attending: Cardiology

## 2014-06-30 ENCOUNTER — Encounter (HOSPITAL_COMMUNITY): Payer: Medicare Other | Admitting: Anesthesiology

## 2014-06-30 ENCOUNTER — Ambulatory Visit (HOSPITAL_COMMUNITY): Payer: Medicare Other | Admitting: Anesthesiology

## 2014-06-30 ENCOUNTER — Encounter (HOSPITAL_COMMUNITY): Payer: Self-pay | Admitting: *Deleted

## 2014-06-30 ENCOUNTER — Ambulatory Visit (HOSPITAL_COMMUNITY)
Admission: RE | Admit: 2014-06-30 | Discharge: 2014-06-30 | Disposition: A | Discharge status: Home / Self Care | Payer: Medicare Other | Source: Ambulatory Visit | Attending: Cardiology | Admitting: Cardiology

## 2014-06-30 DIAGNOSIS — I4891 Unspecified atrial fibrillation: Secondary | ICD-10-CM | POA: Insufficient documentation

## 2014-06-30 DIAGNOSIS — I4819 Other persistent atrial fibrillation: Secondary | ICD-10-CM

## 2014-06-30 DIAGNOSIS — Z7902 Long term (current) use of antithrombotics/antiplatelets: Secondary | ICD-10-CM | POA: Diagnosis not present

## 2014-06-30 DIAGNOSIS — Z882 Allergy status to sulfonamides status: Secondary | ICD-10-CM | POA: Insufficient documentation

## 2014-06-30 DIAGNOSIS — K449 Diaphragmatic hernia without obstruction or gangrene: Secondary | ICD-10-CM | POA: Diagnosis not present

## 2014-06-30 DIAGNOSIS — G4730 Sleep apnea, unspecified: Secondary | ICD-10-CM | POA: Insufficient documentation

## 2014-06-30 DIAGNOSIS — I252 Old myocardial infarction: Secondary | ICD-10-CM | POA: Diagnosis not present

## 2014-06-30 DIAGNOSIS — I10 Essential (primary) hypertension: Secondary | ICD-10-CM | POA: Insufficient documentation

## 2014-06-30 DIAGNOSIS — Z888 Allergy status to other drugs, medicaments and biological substances status: Secondary | ICD-10-CM | POA: Insufficient documentation

## 2014-06-30 DIAGNOSIS — K219 Gastro-esophageal reflux disease without esophagitis: Secondary | ICD-10-CM | POA: Insufficient documentation

## 2014-06-30 DIAGNOSIS — Z7982 Long term (current) use of aspirin: Secondary | ICD-10-CM | POA: Diagnosis not present

## 2014-06-30 HISTORY — PX: CARDIOVERSION: SHX1299

## 2014-06-30 SURGERY — CARDIOVERSION
Anesthesia: General

## 2014-06-30 MED ORDER — SODIUM CHLORIDE 0.9 % IV SOLN
INTRAVENOUS | Status: DC
  Administered 2014-06-30: 10:00:00 via INTRAVENOUS
  Administered 2014-06-30: 500 mL via INTRAVENOUS

## 2014-06-30 MED ORDER — PROPOFOL 10 MG/ML IV BOLUS
INTRAVENOUS | Status: DC | PRN
  Administered 2014-06-30: 90 mg via INTRAVENOUS

## 2014-06-30 MED ORDER — LIDOCAINE HCL (CARDIAC) 20 MG/ML IV SOLN
INTRAVENOUS | Status: DC | PRN
  Administered 2014-06-30: 75 mg via INTRAVENOUS

## 2014-06-30 MED ORDER — SODIUM CHLORIDE 0.9 % IV SOLN
INTRAVENOUS | Status: DC | PRN
Start: 2014-06-30 — End: 2014-06-30
  Administered 2014-06-30: 09:00:00 via INTRAVENOUS

## 2014-06-30 NOTE — Op Note (Signed)
Procedure: Electrical Cardioversion Indications:  Atrial Fibrillation  Procedure Details:  Consent: Risks of procedure as well as the alternatives and risks of each were explained to the (patient/caregiver).  Consent for procedure obtained.  Time Out: Verified patient identification, verified procedure, site/side was marked, verified correct patient position, special equipment/implants available, medications/allergies/relevent history reviewed, required imaging and test results available.  Performed  Patient placed on cardiac monitor, pulse oximetry, supplemental oxygen as necessary.  Sedation given: propofol 90 mg IV, Dr. Al Corpus Pacer pads placed anterior and posterior chest.  Cardioverted 1 time(s).  Cardioverted at 120J. Sync biphasic  Evaluation: Findings: Post procedure EKG shows: sinus bradycardia with PACs, 50 bpm Complications: None Patient did tolerate procedure well.  Time Spent Directly with the Patient:  30 minutes   Rayven Hendrickson 06/30/2014, 9:35 AM

## 2014-06-30 NOTE — H&P (Signed)
This patient is scheduled for elective cardioversion on 06/30/14.  His history is summarized below:  This pleasant 80 year old gentleman is seen for a six-month followup office visit. He has a history of ischemic heart disease. He had an anterior wall myocardial infarction in 1992 and had angioplasty of his LAD. He had an inferior wall myocardial infarction in 2004 and had angioplasty with stenting of a proximal right coronary artery occlusion. He has not been experiencing any recurrent chest pain. His last nuclear stress test was several years ago  and showed an old inferoapical scar and an old anteroseptal scar but no reversible ischemia. His ejection fraction was 40%. He is not having any symptoms of angina or congestive heart failure. . He has been doing a lot of yard work and has muscle soreness but no side effects from his statin therapy. The patient has been diagnosed as having sleep apnea and uses a CPAP machine and has been sleeping well.  At his last office visit 04/28/14 he was found to be in atrial fibrillation. He was not aware of his rhythm. Since then he has been on apixaban 5 mg twice a day. He has not missed any doses. He comes in now to discuss possible elective cardioversion. He has not had any TIA or stroke symptoms.  the general appearance reveals an elderly gentleman in no acute distress.The head and neck exam reveals pupils equal and reactive. Extraocular movements are full. There is no scleral icterus. The mouth and pharynx are normal. The neck is supple. The carotids reveal no bruits. The jugular venous pressure is normal. The thyroid is not enlarged. There is no lymphadenopathy. The chest is clear to percussion and auscultation. There are no rales or rhonchi. Expansion of the chest is symmetrical. The precordium is quiet. The pulse is irregular. The first heart sound is normal. The second heart sound is physiologically split. There is no murmur gallop rub or click. There is no abnormal  lift or heave. The abdomen is soft and nontender. The bowel sounds are normal. The liver and spleen are not enlarged. There is history of right inguinal hernia not examined today. There are no abdominal bruits. Extremities reveal good pedal pulses. There is no phlebitis or edema. There is no cyanosis or clubbing. Strength is normal and symmetrical in all extremities. There is no lateralizing weakness. There are no sensory deficits. The skin is warm and dry. There is no rash.  EKG shows atrial fibrillation with controlled ventricular response. There is a pattern of an old inferior wall MI and old anterior wall MI.

## 2014-06-30 NOTE — Discharge Instructions (Signed)

## 2014-06-30 NOTE — Interval H&P Note (Signed)
History and Physical Interval Note:  06/30/2014 9:09 AM  Henry Ayala  has presented today for surgery, with the diagnosis of AFIB  The various methods of treatment have been discussed with the patient and family. After consideration of risks, benefits and other options for treatment, the patient has consented to  Procedure(s): CARDIOVERSION (N/A) as a surgical intervention .  The patient's history has been reviewed, patient examined, no change in status, stable for surgery.  I have reviewed the patient's chart and labs.  Questions were answered to the patient's satisfaction.     Chemeka Filice

## 2014-06-30 NOTE — Transfer of Care (Signed)
Immediate Anesthesia Transfer of Care Note  Patient: Henry Ayala  Procedure(s) Performed: Procedure(s): CARDIOVERSION (N/A)  Patient Location: PACU  Anesthesia Type:General  Level of Consciousness: awake, alert , oriented and patient cooperative  Airway & Oxygen Therapy: Patient Spontanous Breathing  Post-op Assessment: Report given to PACU RN and Post -op Vital signs reviewed and stable  Post vital signs: Reviewed and stable  Complications: No apparent anesthesia complications

## 2014-06-30 NOTE — Anesthesia Postprocedure Evaluation (Signed)
  Anesthesia Post-op Note  Patient: Henry Ayala  Procedure(s) Performed: Procedure(s): CARDIOVERSION (N/A)  Patient Location: PACU and Endoscopy Unit  Anesthesia Type:General  Level of Consciousness: awake, alert , oriented and patient cooperative  Airway and Oxygen Therapy: Patient Spontanous Breathing  Post-op Pain: none  Post-op Assessment: Post-op Vital signs reviewed, Patient's Cardiovascular Status Stable, Respiratory Function Stable, Patent Airway, No signs of Nausea or vomiting and Adequate PO intake  Post-op Vital Signs: Reviewed and stable  Last Vitals:  Filed Vitals:   06/30/14 0842  BP: 126/82  Temp: 36.4 C  Resp: 14    Complications: No apparent anesthesia complications

## 2014-07-01 ENCOUNTER — Encounter (HOSPITAL_COMMUNITY): Payer: Self-pay | Admitting: Cardiovascular Disease

## 2014-07-07 ENCOUNTER — Encounter: Payer: Self-pay | Admitting: Cardiology

## 2014-07-07 ENCOUNTER — Ambulatory Visit (INDEPENDENT_AMBULATORY_CARE_PROVIDER_SITE_OTHER): Payer: Medicare Other | Admitting: Cardiology

## 2014-07-07 VITALS — BP 102/72 | HR 75 | Ht 67.0 in | Wt 157.8 lb

## 2014-07-07 DIAGNOSIS — I259 Chronic ischemic heart disease, unspecified: Secondary | ICD-10-CM | POA: Diagnosis not present

## 2014-07-07 DIAGNOSIS — I4891 Unspecified atrial fibrillation: Secondary | ICD-10-CM

## 2014-07-07 DIAGNOSIS — K219 Gastro-esophageal reflux disease without esophagitis: Secondary | ICD-10-CM | POA: Diagnosis not present

## 2014-07-07 DIAGNOSIS — I4819 Other persistent atrial fibrillation: Secondary | ICD-10-CM

## 2014-07-07 DIAGNOSIS — E7800 Pure hypercholesterolemia, unspecified: Secondary | ICD-10-CM | POA: Diagnosis not present

## 2014-07-07 NOTE — Assessment & Plan Note (Signed)
Post cardioversion he remained in normal sinus rhythm for less than a week.  We don't know when he went back into atrial flutter since his cardioversion on 06/30/14. We will not make any further attempts at cardioversion.  Continue apixaban  and rate control

## 2014-07-07 NOTE — Assessment & Plan Note (Signed)
Exercise tolerance is good.  He is not experiencing any chest pain or angina.

## 2014-07-07 NOTE — Progress Notes (Signed)
Henry Ayala Date of Birth:  01-11-1931 [place] [address] [place], [place]  [address] [phone]        Fax   [phone]   History of Present Illness: This pleasant 80 year old gentleman is seen for a post cardioversion followup office visit. He has a history of ischemic heart disease. He had an anterior wall myocardial infarction in 1992 and had angioplasty of his LAD. He had an inferior wall myocardial infarction in 2004 and had angioplasty with stenting of a proximal right coronary artery occlusion. He has not been experiencing any recurrent chest pain. His last nuclear stress test was several years ago  and showed an old inferoapical scar and an old anteroseptal scar but no reversible ischemia. His ejection fraction was 40%. He is not having any symptoms of angina or congestive heart failure. . He has been doing a lot of yard work and has muscle soreness but no side effects from his statin therapy. The patient has been diagnosed as having sleep apnea and uses a CPAP machine and has been sleeping well.  At his office visit 04/28/14 he was found to be in atrial fibrillation. He was not aware of his rhythm. Since then he has been on apixaban 5 mg twice a day. He has not missed any doses.  After being on for more than a month the patient underwent direct current cardioversion as an outpatient on 06/30/14.  The cardioversion was successful.  He returns today for followup.  Unfortunately he is back in atrial fibrillation today.  He really can't tell any difference himself when he is in normal sinus and when he is in atrial fibrillation.   Current Outpatient Prescriptions  Medication Sig Dispense Refill  . apixaban (ELIQUIS) 5 MG TABS tablet Take 1 tablet (5 mg total) by mouth 2 (two) times daily.  60 tablet  6  . bimatoprost (LUMIGAN) 0.03 % ophthalmic solution Place 1 drop into both eyes at bedtime.        . cholecalciferol (VITAMIN D) 1000 UNITS tablet Take  1,000 Units by mouth daily.        [name] doxazosin (CARDURA) 4 MG tablet TAKE ONE TABLET BY MOUTH ONCE DAILY  90 tablet  0  . Esomeprazole Magnesium (NEXIUM PO) Take by mouth daily.       [name] lisinopril (PRINIVIL,ZESTRIL) 10 MG tablet TAKE ONE TABLET BY MOUTH ONCE DAILY  90 tablet  0  . lovastatin (MEVACOR) 40 MG tablet TAKE ONE TABLET BY MOUTH ONCE DAILY  90 tablet  0  . metoprolol tartrate (LOPRESSOR) 25 MG tablet TAKE ONE-HALF TABLET BY MOUTH DAILY OR AS DIRECTED  90 tablet  3  . Omega-3 Fatty Acids (OMEGA 3 PO) Take 1 capsule by mouth daily.        . sertraline (ZOLOFT) 50 MG tablet TAKE ONE TABLET BY MOUTH ONCE DAILY  90 tablet  3   No current facility-administered medications for this visit.    Allergies  Allergen Reactions  . Iodinated Diagnostic Agents   . Lipitor [Atorvastatin Calcium]   . Sulfa Drugs Cross Reactors     Patient Active Problem List   Diagnosis Date Noted  . Atrial fibrillation 04/28/2014  . Inguinal hernia 04/28/2014  . Sleep apnea 10/26/2013  . Ischemic heart disease 04/27/2011  . Hypercholesterolemia 04/27/2011  . GERD (gastroesophageal reflux disease) 04/27/2011  . BPH (benign prostatic hyperplasia) 04/27/2011    History  Smoking status  . Never Smoker  Smokeless tobacco  . Never Used    History  Alcohol Use No    Family History  Problem Relation Age of Onset  . Heart attack Father   . Stroke Mother   . Heart attack Brother   . Cancer Sister     colon cancer  . Coronary artery disease Brother     has had multiple coronary stents placed    Review of Systems: Constitutional: no fever chills diaphoresis or fatigue or change in weight.  Head and neck: no hearing loss, no epistaxis, no photophobia or visual disturbance. Respiratory: No cough, shortness of breath or wheezing. Cardiovascular: No chest pain peripheral edema, palpitations. Gastrointestinal: No abdominal distention, no abdominal pain, no change in bowel habits hematochezia or  melena. Genitourinary: No dysuria, no frequency, no urgency, no nocturia. Musculoskeletal:No arthralgias, no back pain, no gait disturbance or myalgias. Neurological: No dizziness, no headaches, no numbness, no seizures, no syncope, no weakness, no tremors. Hematologic: No lymphadenopathy, no easy bruising. Psychiatric: No confusion, no hallucinations, no sleep disturbance.    Physical Exam: Filed Vitals:   07/07/14 1100  BP: 102/72  Pulse: 75  The patient appears to be in no distress.  Head and neck exam reveals that the pupils are equal and reactive.  The extraocular movements are full.  There is no scleral icterus.  Mouth and pharynx are benign.  No lymphadenopathy.  No carotid bruits.  The jugular venous pressure is normal.  Thyroid is not enlarged or tender.  Chest is clear to percussion and auscultation.  No rales or rhonchi.  Expansion of the chest is symmetrical.  Heart reveals no abnormal lift or heave.  The pulse is irregularly irregular  First and second heart sounds are normal.  There is no murmur gallop rub or click.  The abdomen is soft and nontender.  Bowel sounds are normoactive.  There is no hepatosplenomegaly or mass.  There are no abdominal bruits.  Extremities reveal no phlebitis or edema.  Pedal pulses are good.  There is no cyanosis or clubbing.  Neurologic exam is normal strength and no lateralizing weakness.  No sensory deficits.  Integument reveals no rash  EKG shows atrial fibrillation with ventricular response of 75 per minute.  Pattern of an old inferior wall MI and old anteroseptal wall myocardial infarction  Assessment / Plan: 1.  Ischemic heart disease status post anterior wall MI in 1992 treated with angioplasty of his LAD.  Patient had an inferior wall MI in 2004 and had a stent to his proximal right coronary artery. 2. atrial fibrillation status post inability to hold normal sinus rhythm after cardioversion one week ago 3. mild left ventricular  systolic dysfunction with ejection fraction 40% 4. obstructive sleep apnea 5. Hypercholesterolemia  Plan: Continue current therapy.  Recheck in 3 months for office visit EKG CBC and basal metabolic panel.  Okay to resume usual activity.  Continue long-term apixaban.

## 2014-07-07 NOTE — Assessment & Plan Note (Signed)
The patient will continue on lovastatin for his hypercholesterolemia.  He is not having any myalgias

## 2014-07-07 NOTE — Patient Instructions (Signed)
Your physician recommends that you continue on your current medications as directed. Please refer to the Current Medication list given to you today.  Your physician recommends that you schedule a follow-up appointment in: 3 month ov/ekg/cbc/bmet

## 2014-07-14 ENCOUNTER — Ambulatory Visit: Payer: Medicare Other | Admitting: Cardiology

## 2014-07-31 ENCOUNTER — Other Ambulatory Visit: Payer: Self-pay | Admitting: Cardiology

## 2014-08-03 ENCOUNTER — Other Ambulatory Visit: Payer: Self-pay | Admitting: *Deleted

## 2014-08-03 MED ORDER — DOXAZOSIN MESYLATE 4 MG PO TABS: ORAL_TABLET | ORAL | Status: DC

## 2014-08-03 MED ORDER — LISINOPRIL 10 MG PO TABS: ORAL_TABLET | ORAL | Status: DC

## 2014-08-04 DIAGNOSIS — H43829 Vitreomacular adhesion, unspecified eye: Secondary | ICD-10-CM | POA: Diagnosis not present

## 2014-08-04 DIAGNOSIS — H35379 Puckering of macula, unspecified eye: Secondary | ICD-10-CM | POA: Diagnosis not present

## 2014-08-23 DIAGNOSIS — H40023 Open angle with borderline findings, high risk, bilateral: Secondary | ICD-10-CM | POA: Diagnosis not present

## 2014-08-24 DIAGNOSIS — Z23 Encounter for immunization: Secondary | ICD-10-CM | POA: Diagnosis not present

## 2014-10-11 ENCOUNTER — Ambulatory Visit (INDEPENDENT_AMBULATORY_CARE_PROVIDER_SITE_OTHER): Payer: Medicare Other | Admitting: Cardiology

## 2014-10-11 ENCOUNTER — Encounter: Payer: Self-pay | Admitting: Cardiology

## 2014-10-11 VITALS — BP 116/80 | HR 70 | Ht 67.0 in | Wt 154.0 lb

## 2014-10-11 DIAGNOSIS — E7800 Pure hypercholesterolemia, unspecified: Secondary | ICD-10-CM

## 2014-10-11 DIAGNOSIS — I259 Chronic ischemic heart disease, unspecified: Secondary | ICD-10-CM

## 2014-10-11 DIAGNOSIS — I4819 Other persistent atrial fibrillation: Secondary | ICD-10-CM

## 2014-10-11 DIAGNOSIS — I481 Persistent atrial fibrillation: Secondary | ICD-10-CM | POA: Diagnosis not present

## 2014-10-11 DIAGNOSIS — G4730 Sleep apnea, unspecified: Secondary | ICD-10-CM | POA: Diagnosis not present

## 2014-10-11 LAB — BASIC METABOLIC PANEL
BUN: 12 mg/dL (ref 6–23)
CHLORIDE: 103 meq/L (ref 96–112)
CO2: 28 mEq/L (ref 19–32)
Calcium: 8.6 mg/dL (ref 8.4–10.5)
Creatinine, Ser: 1 mg/dL (ref 0.4–1.5)
GFR: 72.78 mL/min (ref >60.00)
GLUCOSE: 83 mg/dL (ref 70–99)
Potassium: 4.5 mEq/L (ref 3.5–5.1)
SODIUM: 137 meq/L (ref 135–145)

## 2014-10-11 LAB — CBC WITH DIFFERENTIAL/PLATELET
Basophils Absolute: 0 10*3/uL (ref 0.0–0.1)
Basophils Relative: 0.5 % (ref 0.0–3.0)
EOS PCT: 2.1 % (ref 0.0–5.0)
Eosinophils Absolute: 0.1 10*3/uL (ref 0.0–0.7)
HCT: 46.1 % (ref 39.0–52.0)
Hemoglobin: 15.4 g/dL (ref 13.0–17.0)
Lymphocytes Relative: 25.2 % (ref 12.0–46.0)
Lymphs Abs: 1.4 10*3/uL (ref 0.7–4.0)
MCHC: 33.5 g/dL (ref 30.0–36.0)
MCV: 96.4 fl (ref 78.0–100.0)
MONOS PCT: 6.3 % (ref 3.0–12.0)
Monocytes Absolute: 0.4 10*3/uL (ref 0.1–1.0)
NEUTROS ABS: 3.8 10*3/uL (ref 1.4–7.7)
Neutrophils Relative %: 65.9 % (ref 43.0–77.0)
PLATELETS: 166 10*3/uL (ref 150.0–400.0)
RBC: 4.78 Mil/uL (ref 4.22–5.81)
RDW: 13.9 % (ref 11.5–15.5)
WBC: 5.7 10*3/uL (ref 4.0–10.5)

## 2014-10-11 NOTE — Patient Instructions (Addendum)
Will obtain labs today and call you with the results (CBC/BMET)  Your physician recommends that you continue on your current medications as directed. Please refer to the Current Medication list given to you today  Your physician wants you to follow-up in: 6 months with fasting labs (lp/bmet/hfp/CBC) AND EKG    You will receive a reminder letter in the mail two months in advance. If you don't receive a letter, please call our office to schedule the follow-up appointment.

## 2014-10-11 NOTE — Progress Notes (Signed)
Quick Note:  Please report to patient. The recent labs are stable. Continue same medication and careful diet. ______ 

## 2014-10-11 NOTE — Progress Notes (Signed)
[name] Date of Birth:  1930/11/18 Henry Ayala [address] [place], [place]  [address] [phone]        Fax   [phone]   History of Present Illness: This pleasant 80 year old gentleman is seen for a scheduled followup office visit. He has a history of ischemic heart disease. He had an anterior wall myocardial infarction in 1992 and had angioplasty of his LAD. He had an inferior wall myocardial infarction in 2004 and had angioplasty with stenting of a proximal right coronary artery occlusion. He has not been experiencing any recurrent chest pain. His last nuclear stress test was several years ago  and showed an old inferoapical scar and an old anteroseptal scar but no reversible ischemia. His ejection fraction was 40%. He is not having any symptoms of angina or congestive heart failure. . He has been doing a lot of yard work and has muscle soreness but no side effects from his statin therapy. The patient has been diagnosed as having sleep apnea and uses a CPAP machine and has been sleeping well.  At his office visit 04/28/14 he was found to be in atrial fibrillation. He was not aware of his rhythm. Since then he has been on apixaban 5 mg twice a day. He has not missed any doses.  After being on for more than a month the patient underwent direct current cardioversion as an outpatient on 06/30/14.  The cardioversion was successful.  He returned for follow-up one week later and was found to be back in atrial fibrillation.  He really can't tell any difference himself when he is in normal sinus and when he is in atrial fibrillation.  Accordingly, our strategy is rate control and permanent anticoagulation. The patient continues to exercise 3 days a week.  He goes to the club and does 30 minutes of cardio and then does free weights.   Current Outpatient Prescriptions  Medication Sig Dispense Refill  . apixaban (ELIQUIS) 5 MG TABS tablet Take 1 tablet (5 mg total) by  mouth 2 (two) times daily. 60 tablet 6  . bimatoprost (LUMIGAN) 0.03 % ophthalmic solution Place 1 drop into both eyes at bedtime.      . cholecalciferol (VITAMIN D) 1000 UNITS tablet Take 1,000 Units by mouth daily.      [name] doxazosin (CARDURA) 4 MG tablet TAKE ONE TABLET BY MOUTH ONCE DAILY 90 tablet 0  . FLUZONE HIGH-DOSE 0.5 ML SUSY   0  . lisinopril (PRINIVIL,ZESTRIL) 10 MG tablet TAKE ONE TABLET BY MOUTH ONCE DAILY 90 tablet 0  . lovastatin (MEVACOR) 40 MG tablet TAKE ONE TABLET BY MOUTH ONCE DAILY 90 tablet 0  . metoprolol tartrate (LOPRESSOR) 25 MG tablet TAKE ONE-HALF TABLET BY MOUTH ONCE DAILY OR TAKE AS DIRECTED. 90 tablet 0  . NEXIUM 40 MG capsule Take 40 mg by mouth daily.     . Omega-3 Fatty Acids (OMEGA 3 PO) Take 1 capsule by mouth daily.      . sertraline (ZOLOFT) 50 MG tablet TAKE ONE TABLET BY MOUTH ONCE DAILY 90 tablet 3   No current facility-administered medications for this visit.    Allergies  Allergen Reactions  . Iodinated Diagnostic Agents   . Lipitor [Atorvastatin Calcium]   . Sulfa Drugs Cross Reactors     Patient Active Problem List   Diagnosis Date Noted  . Atrial fibrillation 04/28/2014  . Inguinal hernia 04/28/2014  . Sleep apnea 10/26/2013  . Ischemic heart disease 04/27/2011  .  Hypercholesterolemia 04/27/2011  . GERD (gastroesophageal reflux disease) 04/27/2011  . BPH (benign prostatic hyperplasia) 04/27/2011    History  Smoking status  . Never Smoker   Smokeless tobacco  . Never Used    History  Alcohol Use No    Family History  Problem Relation Age of Onset  . Heart attack Father   . Stroke Mother   . Heart attack Brother   . Cancer Sister     colon cancer  . Coronary artery disease Brother     has had multiple coronary stents placed    Review of Systems: Constitutional: no fever chills diaphoresis or fatigue or change in weight.  Head and neck: no hearing loss, no epistaxis, no photophobia or visual disturbance. Respiratory:  No cough, shortness of breath or wheezing. Cardiovascular: No chest pain peripheral edema, palpitations. Gastrointestinal: No abdominal distention, no abdominal pain, no change in bowel habits hematochezia or melena. Genitourinary: No dysuria, no frequency, no urgency, no nocturia. Musculoskeletal:No arthralgias, no back pain, no gait disturbance or myalgias. Neurological: No dizziness, no headaches, no numbness, no seizures, no syncope, no weakness, no tremors. Hematologic: No lymphadenopathy, no easy bruising. Psychiatric: No confusion, no hallucinations, no sleep disturbance.    Physical Exam: Filed Vitals:   10/11/14 0815  BP: 116/80  Pulse: 70  The patient appears to be in no distress.  Head and neck exam reveals that the pupils are equal and reactive.  The extraocular movements are full.  There is no scleral icterus.  Mouth and pharynx are benign.  No lymphadenopathy.  No carotid bruits.  The jugular venous pressure is normal.  Thyroid is not enlarged or tender.  Chest is clear to percussion and auscultation.  No rales or rhonchi.  Expansion of the chest is symmetrical.  Heart reveals no abnormal lift or heave.  The pulse is irregularly irregular  First and second heart sounds are normal.  There is no murmur gallop rub or click.  The abdomen is soft and nontender.  Bowel sounds are normoactive.  There is no hepatosplenomegaly or mass.  There are no abdominal bruits.  Extremities reveal no phlebitis or edema.  Pedal pulses are good.  There is no cyanosis or clubbing.  Neurologic exam is normal strength and no lateralizing weakness.  No sensory deficits.  Integument reveals no rash   Assessment / Plan: 1.  Ischemic heart disease status post anterior wall MI in 1992 treated with angioplasty of his LAD.  Patient had an inferior wall MI in 2004 and had a stent to his proximal right coronary artery.  No recurrent chest pain or angina.   2. atrial fibrillation status post inability  to hold normal sinus rhythm after cardioversion .  Strategy is rate control and anticoagulation going forward. 3. mild left ventricular systolic dysfunction with ejection fraction 40% 4. obstructive sleep apnea.  He uses CPAP some nights but not always. 5. Hypercholesterolemia  Plan: Continue current therapy.  Recheck in 6 months for office visit EKG CBC, lipid panel, hepatic function panel, and basal metabolic panel.  Continue usual activity.  Continue long-term apixaban.

## 2014-10-14 ENCOUNTER — Telehealth: Payer: Self-pay | Admitting: *Deleted

## 2014-10-14 NOTE — Telephone Encounter (Signed)
New Message  Left vm for pt concerning flu shot 

## 2014-10-26 ENCOUNTER — Other Ambulatory Visit: Payer: Self-pay | Admitting: Cardiology

## 2014-11-29 ENCOUNTER — Other Ambulatory Visit: Payer: Self-pay | Admitting: Cardiology

## 2014-12-10 DIAGNOSIS — R3916 Straining to void: Secondary | ICD-10-CM | POA: Diagnosis not present

## 2014-12-13 ENCOUNTER — Other Ambulatory Visit: Payer: Self-pay | Admitting: Cardiology

## 2015-01-18 ENCOUNTER — Other Ambulatory Visit: Payer: Self-pay | Admitting: Cardiology

## 2015-01-31 DIAGNOSIS — H5340 Unspecified visual field defects: Secondary | ICD-10-CM | POA: Diagnosis not present

## 2015-01-31 DIAGNOSIS — H4010X1 Unspecified open-angle glaucoma, mild stage: Secondary | ICD-10-CM | POA: Diagnosis not present

## 2015-01-31 DIAGNOSIS — H47233 Glaucomatous optic atrophy, bilateral: Secondary | ICD-10-CM | POA: Diagnosis not present

## 2015-01-31 DIAGNOSIS — H4011X1 Primary open-angle glaucoma, mild stage: Secondary | ICD-10-CM | POA: Diagnosis not present

## 2015-03-26 ENCOUNTER — Other Ambulatory Visit: Payer: Self-pay | Admitting: Cardiology

## 2015-03-29 DIAGNOSIS — Z23 Encounter for immunization: Secondary | ICD-10-CM | POA: Diagnosis not present

## 2015-04-07 ENCOUNTER — Other Ambulatory Visit (INDEPENDENT_AMBULATORY_CARE_PROVIDER_SITE_OTHER): Payer: Medicare Other | Admitting: *Deleted

## 2015-04-07 DIAGNOSIS — I259 Chronic ischemic heart disease, unspecified: Secondary | ICD-10-CM

## 2015-04-07 DIAGNOSIS — E7800 Pure hypercholesterolemia, unspecified: Secondary | ICD-10-CM

## 2015-04-07 DIAGNOSIS — I4819 Other persistent atrial fibrillation: Secondary | ICD-10-CM

## 2015-04-07 DIAGNOSIS — I481 Persistent atrial fibrillation: Secondary | ICD-10-CM

## 2015-04-07 LAB — CBC WITH DIFFERENTIAL/PLATELET
Basophils Absolute: 0 10*3/uL (ref 0.0–0.1)
Basophils Relative: 0.6 % (ref 0.0–3.0)
Eosinophils Absolute: 0.1 10*3/uL (ref 0.0–0.7)
Eosinophils Relative: 1.9 % (ref 0.0–5.0)
HCT: 45.4 % (ref 39.0–52.0)
HEMOGLOBIN: 15.5 g/dL (ref 13.0–17.0)
LYMPHS ABS: 1.5 10*3/uL (ref 0.7–4.0)
Lymphocytes Relative: 27.5 % (ref 12.0–46.0)
MCHC: 34.1 g/dL (ref 30.0–36.0)
MCV: 97 fl (ref 78.0–100.0)
Monocytes Absolute: 0.3 10*3/uL (ref 0.1–1.0)
Monocytes Relative: 6.1 % (ref 3.0–12.0)
Neutro Abs: 3.6 10*3/uL (ref 1.4–7.7)
Neutrophils Relative %: 63.9 % (ref 43.0–77.0)
Platelets: 171 10*3/uL (ref 150.0–400.0)
RBC: 4.68 Mil/uL (ref 4.22–5.81)
RDW: 14.2 % (ref 11.5–15.5)
WBC: 5.6 10*3/uL (ref 4.0–10.5)

## 2015-04-07 LAB — BASIC METABOLIC PANEL
BUN: 19 mg/dL (ref 6–23)
CALCIUM: 9.2 mg/dL (ref 8.4–10.5)
CO2: 31 meq/L (ref 19–32)
Chloride: 105 mEq/L (ref 96–112)
Creatinine, Ser: 1.12 mg/dL (ref 0.40–1.50)
GFR: 66.74 mL/min (ref >60.00)
GLUCOSE: 95 mg/dL (ref 70–99)
Potassium: 4.1 mEq/L (ref 3.5–5.1)
Sodium: 139 mEq/L (ref 135–145)

## 2015-04-07 LAB — LIPID PANEL
Cholesterol: 127 mg/dL (ref 0–200)
HDL: 53.3 mg/dL (ref >39.00)
LDL Cholesterol: 64 mg/dL (ref 0–99)
NONHDL: 73.7
Total CHOL/HDL Ratio: 2
Triglycerides: 51 mg/dL (ref 0.0–149.0)
VLDL: 10.2 mg/dL (ref 0.0–40.0)

## 2015-04-07 LAB — HEPATIC FUNCTION PANEL
ALT: 13 U/L (ref 0–53)
AST: 15 U/L (ref 0–37)
Albumin: 4 g/dL (ref 3.5–5.2)
Alkaline Phosphatase: 43 U/L (ref 39–117)
Bilirubin, Direct: 0.2 mg/dL (ref 0.0–0.3)
Total Bilirubin: 0.9 mg/dL (ref 0.2–1.2)
Total Protein: 6.3 g/dL (ref 6.0–8.3)

## 2015-04-07 NOTE — Progress Notes (Signed)
Quick Note:  Please make copy of labs for patient visit. ______ 

## 2015-04-12 ENCOUNTER — Encounter: Payer: Self-pay | Admitting: Cardiology

## 2015-04-12 ENCOUNTER — Ambulatory Visit (INDEPENDENT_AMBULATORY_CARE_PROVIDER_SITE_OTHER): Payer: Medicare Other | Admitting: Cardiology

## 2015-04-12 VITALS — BP 122/64 | HR 78 | Ht 67.0 in | Wt 151.8 lb

## 2015-04-12 DIAGNOSIS — I259 Chronic ischemic heart disease, unspecified: Secondary | ICD-10-CM | POA: Diagnosis not present

## 2015-04-12 DIAGNOSIS — E7800 Pure hypercholesterolemia, unspecified: Secondary | ICD-10-CM

## 2015-04-12 DIAGNOSIS — I4819 Other persistent atrial fibrillation: Secondary | ICD-10-CM

## 2015-04-12 DIAGNOSIS — K219 Gastro-esophageal reflux disease without esophagitis: Secondary | ICD-10-CM | POA: Diagnosis not present

## 2015-04-12 DIAGNOSIS — I481 Persistent atrial fibrillation: Secondary | ICD-10-CM | POA: Diagnosis not present

## 2015-04-12 NOTE — Progress Notes (Signed)
Cardiology Office Note   Date:  04/12/2015   ID:  JAREB RADONCIC, DOB Apr 05, 1931, MRN [id number]  PCP:  Warren Danes, MD  Cardiologist: Darlin Coco MD  No chief complaint on file.     History of Present Illness: Henry Ayala is a 81 y.o. male who presents for a six-month follow-up office visit  This pleasant 81 year old gentleman is seen for a scheduled followup office visit. He has a history of ischemic heart disease. He had an anterior wall myocardial infarction in 1992 and had angioplasty of his LAD. He had an inferior wall myocardial infarction in 2004 and had angioplasty with stenting of a proximal right coronary artery occlusion. He has not been experiencing any recurrent chest pain. His last nuclear stress test was several years ago  and showed an old inferoapical scar and an old anteroseptal scar but no reversible ischemia. His ejection fraction was 40%. He is not having any symptoms of angina or congestive heart failure. . He has been doing a lot of yard work and has muscle soreness but no side effects from his statin therapy. The patient has been diagnosed as having sleep apnea and uses a CPAP machine and has been sleeping well.  At his office visit 04/28/14 he was found to be in atrial fibrillation. He was not aware of his rhythm. Since then he has been on apixaban 5 mg twice a day. He has not missed any doses. After being on for more than a month the patient underwent direct current cardioversion as an outpatient on 06/30/14. The cardioversion was successful. He returned for follow-up one week later and was found to be back in atrial fibrillation. He really can't tell any difference himself when he is in normal sinus and when he is in atrial fibrillation. Accordingly, our strategy is rate control and permanent anticoagulation. The patient continues to exercise 3 days a week. He goes to the club and does 30 minutes of cardio and then does free weights. The  patient is now a widower.  His wife died of ischemia.  He has lost more weight which he attributes to not eating as well.  He also stays very busy keeping up his yard here in [place] and also [place] The patient has not had any recurrent chest pain or angina.  No TIA symptoms.  No shortness of breath.  Past Medical History  Diagnosis Date  . Hyperlipidemia   . Ischemic heart disease   . Anteroseptal myocardial infarction 1992    treated with angioplasty of his LAD   . Myocardial infarction, inferior wall 2004    treated with angioplasty and stenting of the proximal right coronary artery  . GERD (gastroesophageal reflux disease)   . Sleep apnea     was told CPAP probably would not help him  . Nephrolithiasis   . BPH (benign prostatic hyperplasia)   . Hiatal hernia   . History of echocardiogram 07/30/2008    Est. EF of 40%  -- LOW RISK NUCLEAR STUDY -- Old anteroseptal and inferoapical scars -- No significant reversible ischemia -- Since last cardiolite of 07/02/07, EF has increased from 32% to 40% -- Continue aggressive medical therapy -- Marcello Moores A. Mare Ferrari, MD  . Hypertension     Past Surgical History  Procedure Laterality Date  . Total knee arthroplasty      right  . Cardiac catheterization  10/30/2004    Est. EF was 35-40% -- Coronary and left ventricular angiography.  [name]  Cardiac catheterization  1994     followup cardiac catheterization showed nonobstructive disease  . Cardiac catheterization  1995  . Cardiac catheterization  2004    EF was depressed at 37% -- stenting of the right coronary artery at [facility]  . Cardiac catheterization  10/30/2004    -- EF is estimated at 35-40% --  Left ventricular angiography performed in the RAO view demonstrates moderate hypokinesia involving the anterolateral wall & inferior wall  -- with no significant mitral insufficiency. -- Two vessel atherosclerotic coronary artery disease, excellent long-term patency  of the prior angioplasty site in the LAD  --  Moderate left ventricular dysfunction   . Cardioversion N/A 06/30/2014    Procedure: CARDIOVERSION;  Surgeon: Sanda Klein, MD;  Location: [facility];  Service: Cardiovascular;  Laterality: N/A;      Allergies:   Iodinated diagnostic agents; Lipitor; and Sulfa drugs cross reactors    Social History:  The patient  reports that he has never smoked. He has never used smokeless tobacco. He reports that he does not drink alcohol or use illicit drugs.   Family History:  The patient's family history includes Cancer in his sister; Coronary artery disease in his brother; Heart attack in his brother and father; Stroke in his mother.    ROS:  Please see the history of present illness.   Otherwise, review of systems are positive for none.   All other systems are reviewed and negative.    PHYSICAL EXAM: VS:  BP 122/64 mmHg  Pulse 78  Ht 5\' 7"  (1.702 m)  Wt 151 lb 12.8 oz (68.856 kg)  BMI 23.77 kg/m2 , BMI Body mass index is 23.77 kg/(m^2). GEN: Well nourished, well developed, in no acute distress HEENT: normal Neck: no JVD, carotid bruits, or masses Cardiac: RRR; no murmurs, rubs, or gallops,no edema  Respiratory:  clear to auscultation bilaterally, normal work of breathing GI: soft, nontender, nondistended, + BS MS: no deformity or atrophy Skin: warm and dry, no rash Neuro:  Strength and sensation are intact Psych: euthymic mood, full affect   EKG:  EKG is ordered today. The ekg ordered today demonstrates atrial fibrillation with controlled ventricular response.  Since prior tracing of 07/07/14, no significant change.  Pattern of old anterior wall MI is unchanged.   Recent Labs: 04/28/2014: TSH 1.36 04/07/2015: ALT 13; BUN 19; Creatinine 1.12; Hemoglobin 15.5; Platelets 171.0; Potassium 4.1; Sodium 139    Lipid Panel    Component Value Date/Time   CHOL 127 04/07/2015 0938   TRIG 51.0 04/07/2015 0938   HDL 53.30 04/07/2015 0938    CHOLHDL 2 04/07/2015 0938   VLDL 10.2 04/07/2015 0938   LDLCALC 64 04/07/2015 0938      Wt Readings from Last 3 Encounters:  04/12/15 151 lb 12.8 oz (68.856 kg)  10/11/14 154 lb (69.854 kg)  07/07/14 157 lb 12.8 oz (71.578 kg)      Other studies Reviewed: Additional studies/ records that were reviewed today include: . Review of the above records demonstrates:    ASSESSMENT AND PLAN:  1. Ischemic heart disease status post anterior wall MI in 1992 treated with angioplasty of his LAD. Patient had an inferior wall MI in 2004 and had a stent to his proximal right coronary artery. No recurrent chest pain or angina.  2. atrial fibrillation status post inability to hold normal sinus rhythm after cardioversion . Strategy is rate control and anticoagulation going forward. 3. mild left ventricular systolic dysfunction with ejection fraction  40% 4. obstructive sleep apnea. He uses CPAP some nights but not always. 5. Hypercholesterolemia  Plan: Continue current therapy. Recheck in 6 months for office visit  , lipid panel, hepatic function panel, and basal metabolic panel. Continue usual activity. Continue long-term apixaban.  Current medicines are reviewed at length with the patient today.  The patient does not have concerns regarding medicines.  The following changes have been made:  no change  Labs/ tests ordered today include:  Orders Placed This Encounter  Procedures  . Lipid panel  . Basic metabolic panel  . Hepatic function panel  . EKG 12-Lead       Signed, Darlin Coco MD 04/12/2015 5:26 PM    [facility] [address], [place], [place]  [address] Phone: [phone]; Fax: [phone]

## 2015-04-12 NOTE — Patient Instructions (Signed)
Medication Instructions:  Your physician recommends that you continue on your current medications as directed. Please refer to the Current Medication list given to you today.  Labwork: NONE  Testing/Procedures: NONE  Follow-Up: Your physician wants you to follow-up in: 6 months with fasting labs (lp/bmet/hfp)  You will receive a reminder letter in the mail two months in advance. If you don't receive a letter, please call our office to schedule the follow-up appointment.     

## 2015-05-02 DIAGNOSIS — H4011X1 Primary open-angle glaucoma, mild stage: Secondary | ICD-10-CM | POA: Diagnosis not present

## 2015-05-02 DIAGNOSIS — H5203 Hypermetropia, bilateral: Secondary | ICD-10-CM | POA: Diagnosis not present

## 2015-05-02 DIAGNOSIS — H524 Presbyopia: Secondary | ICD-10-CM | POA: Diagnosis not present

## 2015-05-02 DIAGNOSIS — H52223 Regular astigmatism, bilateral: Secondary | ICD-10-CM | POA: Diagnosis not present

## 2015-05-02 DIAGNOSIS — Z9849 Cataract extraction status, unspecified eye: Secondary | ICD-10-CM | POA: Diagnosis not present

## 2015-05-03 ENCOUNTER — Other Ambulatory Visit: Payer: Self-pay

## 2015-05-03 MED ORDER — ESOMEPRAZOLE MAGNESIUM 40 MG PO CPDR: 40.0000 mg | DELAYED_RELEASE_CAPSULE | Freq: Every day | ORAL | Status: DC

## 2015-05-03 MED ORDER — APIXABAN 5 MG PO TABS: 5.0000 mg | ORAL_TABLET | Freq: Two times a day (BID) | ORAL | Status: DC

## 2015-05-30 ENCOUNTER — Telehealth: Payer: Self-pay | Admitting: Cardiology

## 2015-05-30 NOTE — Telephone Encounter (Signed)
Pt's daughter, [name] calling. Meredith informed I do not see DPR completed by pt, I am unable to discuss pt's medical information with her.  [name] states she has discussed pt's memory loss with Dr Mare Ferrari in the past, she feels his memory loss is worse.  [name] states pt is taking Eliquis at the same time once daily instead of 1 tablet bid. I advised [name] that it may help to get a pill box with slots for AM and PM meds, fill the pill box on a weekly basis. [name] advised, in general, cardiology would recommend follow up with PCP for progressive memory loss. [name] asked me to forward her concerns to Dr Mare Ferrari, she is aware Dr Mare Ferrari is out of the office this week.

## 2015-05-30 NOTE — Telephone Encounter (Signed)
New message      Daughter is concerned because patient's short term memory is worse since 2 weeks ago.  He stated to her that he is taking his eliquis 2 tablets at one time instead of 1 tablet in am and 1 tablet in pm.  She is not sure how he is taking his other meds.  Daughter states that her mother died suddenly in November 28, 2023 and pt lives alone.  Daughter is hoping Dr Mare Ferrari can give her some guidelines on what to do

## 2015-05-31 NOTE — Telephone Encounter (Signed)
I agree with the suggestion about filling the pillbox for him. If we could see him for an office visit sometime we could tell him of his daughter's concern for his memory and start him on some medicine or refer him to neurology.  The daughter would need to tell him that she has told us of her concern for his memory.

## 2015-05-31 NOTE — Telephone Encounter (Signed)
Spoke with daughter and her brother is coming to town this weekend for 10 days They both live out of town but will be talking to patient about his memory changes Daughter will call back next week with update and will try to schedule ov at that time

## 2015-06-05 ENCOUNTER — Other Ambulatory Visit: Payer: Self-pay | Admitting: Cardiology

## 2015-06-07 ENCOUNTER — Other Ambulatory Visit: Payer: Self-pay | Admitting: *Deleted

## 2015-06-09 ENCOUNTER — Other Ambulatory Visit: Payer: Self-pay | Admitting: Cardiology

## 2015-08-12 ENCOUNTER — Other Ambulatory Visit: Payer: Self-pay | Admitting: Physician Assistant

## 2015-08-12 DIAGNOSIS — D225 Melanocytic nevi of trunk: Secondary | ICD-10-CM | POA: Diagnosis not present

## 2015-08-12 DIAGNOSIS — D1801 Hemangioma of skin and subcutaneous tissue: Secondary | ICD-10-CM | POA: Diagnosis not present

## 2015-08-12 DIAGNOSIS — L570 Actinic keratosis: Secondary | ICD-10-CM | POA: Diagnosis not present

## 2015-08-12 DIAGNOSIS — L814 Other melanin hyperpigmentation: Secondary | ICD-10-CM | POA: Diagnosis not present

## 2015-08-12 DIAGNOSIS — L821 Other seborrheic keratosis: Secondary | ICD-10-CM | POA: Diagnosis not present

## 2015-08-12 DIAGNOSIS — C44219 Basal cell carcinoma of skin of left ear and external auricular canal: Secondary | ICD-10-CM | POA: Diagnosis not present

## 2015-09-02 DIAGNOSIS — Z23 Encounter for immunization: Secondary | ICD-10-CM | POA: Diagnosis not present

## 2015-09-05 DIAGNOSIS — H401132 Primary open-angle glaucoma, bilateral, moderate stage: Secondary | ICD-10-CM | POA: Diagnosis not present

## 2015-09-14 ENCOUNTER — Other Ambulatory Visit: Payer: Self-pay | Admitting: Cardiology

## 2015-09-21 DIAGNOSIS — C44219 Basal cell carcinoma of skin of left ear and external auricular canal: Secondary | ICD-10-CM | POA: Diagnosis not present

## 2015-10-10 ENCOUNTER — Ambulatory Visit: Payer: Medicare Other | Admitting: Cardiology

## 2015-11-04 ENCOUNTER — Other Ambulatory Visit: Payer: Self-pay | Admitting: Cardiology

## 2015-11-30 ENCOUNTER — Ambulatory Visit: Payer: Medicare Other | Admitting: Cardiology

## 2015-12-01 ENCOUNTER — Encounter: Payer: Self-pay | Admitting: Cardiology

## 2015-12-01 ENCOUNTER — Telehealth: Payer: Self-pay | Admitting: Cardiology

## 2015-12-01 NOTE — Telephone Encounter (Signed)
Error//sending no show letter

## 2015-12-16 ENCOUNTER — Ambulatory Visit (INDEPENDENT_AMBULATORY_CARE_PROVIDER_SITE_OTHER): Payer: Medicare Other | Admitting: Cardiology

## 2015-12-16 ENCOUNTER — Encounter: Payer: Self-pay | Admitting: Cardiology

## 2015-12-16 VITALS — BP 116/56 | HR 64 | Ht 68.0 in | Wt 149.4 lb

## 2015-12-16 DIAGNOSIS — I259 Chronic ischemic heart disease, unspecified: Secondary | ICD-10-CM

## 2015-12-16 DIAGNOSIS — I4819 Other persistent atrial fibrillation: Secondary | ICD-10-CM

## 2015-12-16 DIAGNOSIS — E7800 Pure hypercholesterolemia, unspecified: Secondary | ICD-10-CM

## 2015-12-16 DIAGNOSIS — I481 Persistent atrial fibrillation: Secondary | ICD-10-CM | POA: Diagnosis not present

## 2015-12-16 DIAGNOSIS — I4891 Unspecified atrial fibrillation: Secondary | ICD-10-CM | POA: Diagnosis not present

## 2015-12-16 LAB — BASIC METABOLIC PANEL
BUN: 20 mg/dL (ref 7–25)
CHLORIDE: 103 mmol/L (ref 98–110)
CO2: 26 mmol/L (ref 20–31)
Calcium: 8.3 mg/dL — ABNORMAL LOW (ref 8.6–10.3)
Creat: 1.04 mg/dL (ref 0.70–1.11)
GLUCOSE: 92 mg/dL (ref 65–99)
Potassium: 3.9 mmol/L (ref 3.5–5.3)
SODIUM: 140 mmol/L (ref 135–146)

## 2015-12-16 LAB — CBC WITH DIFFERENTIAL/PLATELET
BASOS PCT: 0 % (ref 0–1)
Basophils Absolute: 0 10*3/uL (ref 0.0–0.1)
Eosinophils Absolute: 0.1 10*3/uL (ref 0.0–0.7)
Eosinophils Relative: 2 % (ref 0–5)
HEMATOCRIT: 43.5 % (ref 39.0–52.0)
HEMOGLOBIN: 14.7 g/dL (ref 13.0–17.0)
LYMPHS ABS: 1.6 10*3/uL (ref 0.7–4.0)
LYMPHS PCT: 25 % (ref 12–46)
MCH: 32.2 pg (ref 26.0–34.0)
MCHC: 33.8 g/dL (ref 30.0–36.0)
MCV: 95.2 fL (ref 78.0–100.0)
MONOS PCT: 9 % (ref 3–12)
MPV: 10.2 fL (ref 8.6–12.4)
Monocytes Absolute: 0.6 10*3/uL (ref 0.1–1.0)
NEUTROS ABS: 4 10*3/uL (ref 1.7–7.7)
NEUTROS PCT: 64 % (ref 43–77)
Platelets: 172 10*3/uL (ref 150–400)
RBC: 4.57 MIL/uL (ref 4.22–5.81)
RDW: 13.2 % (ref 11.5–15.5)
WBC: 6.3 10*3/uL (ref 4.0–10.5)

## 2015-12-16 LAB — LIPID PANEL
CHOL/HDL RATIO: 2.5 ratio (ref <=5.0)
Cholesterol: 129 mg/dL (ref 125–200)
HDL: 51 mg/dL (ref >=40)
LDL CALC: 59 mg/dL (ref <130)
Triglycerides: 94 mg/dL (ref <150)
VLDL: 19 mg/dL (ref <30)

## 2015-12-16 LAB — HEPATIC FUNCTION PANEL
ALBUMIN: 3.8 g/dL (ref 3.6–5.1)
ALT: 9 U/L (ref 9–46)
AST: 13 U/L (ref 10–35)
Alkaline Phosphatase: 41 U/L (ref 40–115)
BILIRUBIN DIRECT: 0.1 mg/dL (ref <=0.2)
BILIRUBIN TOTAL: 0.4 mg/dL (ref 0.2–1.2)
Indirect Bilirubin: 0.3 mg/dL (ref 0.2–1.2)
Total Protein: 6.1 g/dL (ref 6.1–8.1)

## 2015-12-16 NOTE — Progress Notes (Signed)
Quick Note:  Please report to patient. The recent labs are stable. Continue same medication and careful diet. ______ 

## 2015-12-16 NOTE — Patient Instructions (Signed)
Medication Instructions:  Your physician recommends that you continue on your current medications as directed. Please refer to the Current Medication list given to you today.  Labwork: Lp/bmet/hfp/cbc  Testing/Procedures: none  Follow-Up: Your physician wants you to follow-up in: 6 months ov/ekg with Dr Domenic Polite in [facility] will receive a reminder letter in the mail two months in advance. If you don't receive a letter, please call our office to schedule the follow-up appointment.  If you need a refill on your cardiac medications before your next appointment, please call your pharmacy.

## 2015-12-16 NOTE — Progress Notes (Signed)
Cardiology Office Note   Date:  12/16/2015   ID:  Henry Ayala, DOB August 19, 1931, MRN [id number]  PCP:  Warren Danes, MD  Cardiologist: Darlin Coco MD  No chief complaint on file.     History of Present Illness: Henry Ayala is a 82 y.o. male who presents for a 6 month follow-up visit   He has a history of ischemic heart disease. He had an anterior wall myocardial infarction in 1992 and had angioplasty of his LAD. He had an inferior wall myocardial infarction in 2004 and had angioplasty with stenting of a proximal right coronary artery occlusion. He has not been experiencing any recurrent chest pain. His last nuclear stress test was several years ago  and showed an old inferoapical scar and an old anteroseptal scar but no reversible ischemia. His ejection fraction was 40%. He is not having any symptoms of angina or congestive heart failure. . . The patient has been diagnosed as having sleep apnea and uses a CPAP machine and has been sleeping well.  At his office visit 04/28/14 he was found to be in atrial fibrillation. He was not aware of his rhythm. Since then he has been on apixaban 5 mg twice a day. He has not missed any doses. After being on for more than a month the patient underwent direct current cardioversion as an outpatient on 06/30/14. The cardioversion was successful. He returned for follow-up one week later and was found to be back in atrial fibrillation. He really can't tell any difference himself when he is in normal sinus and when he is in atrial fibrillation. Accordingly, our strategy is rate control and permanent anticoagulation. The patient is now a widower. His wife died of leukemia.  Since we last saw him he has moved back to [place], [place] which is where he grew up.  He has a brother who is a retired Animal nutritionist who also lives there.  His brother has a farm. The patient no longer goes to the gym to exercise.  He has an exercise bike at  home. He has lost a few pounds of weight.  He states that that is because he is having to eat his own cooking. He has a son who lives in [place] and a daughter who lives in [place].  The daughter has 6 children.  The children enjoy using the family cottage up at [facility].  He himself has not been back up there in more than a year. He states he has not been experiencing any chest pain or shortness of breath or palpitations or dizziness or syncope.  He is not having any exacerbations of his chronic GERD  Past Medical History  Diagnosis Date  . Hyperlipidemia   . Ischemic heart disease   . Anteroseptal myocardial infarction [facility] 1992    treated with angioplasty of his LAD   . Myocardial infarction, inferior wall ([place]) 2004    treated with angioplasty and stenting of the proximal right coronary artery  . GERD (gastroesophageal reflux disease)   . Sleep apnea     was told CPAP probably would not help him  . Nephrolithiasis   . BPH (benign prostatic hyperplasia)   . Hiatal hernia   . History of echocardiogram 07/30/2008    Est. EF of 40%  -- LOW RISK NUCLEAR STUDY -- Old anteroseptal and inferoapical scars -- No significant reversible ischemia -- Since last cardiolite of 07/02/07, EF has increased from 32% to 40% -- Continue aggressive medical  therapy -- Henry Moores A. Mare Ferrari, MD  . Hypertension     Past Surgical History  Procedure Laterality Date  . Total knee arthroplasty      right  . Cardiac catheterization  10/30/2004    Est. EF was 35-40% -- Coronary and left ventricular angiography.  . Cardiac catheterization  1994     followup cardiac catheterization showed nonobstructive disease  . Cardiac catheterization  1995  . Cardiac catheterization  2004    EF was depressed at 37% -- stenting of the right coronary artery at [facility]  . Cardiac catheterization  10/30/2004    -- EF is estimated at 35-40% --  Left ventricular angiography performed in the RAO view  demonstrates moderate hypokinesia involving the anterolateral wall & inferior wall  -- with no significant mitral insufficiency. -- Two vessel atherosclerotic coronary artery disease, excellent long-term patency of the prior angioplasty site in the LAD  --  Moderate left ventricular dysfunction   . Cardioversion N/A 06/30/2014    Procedure: CARDIOVERSION;  Surgeon: Sanda Klein, MD;  Location: [facility];  Service: Cardiovascular;  Laterality: N/A;     Current Outpatient Prescriptions  Medication Sig Dispense Refill  . apixaban (ELIQUIS) 5 MG TABS tablet Take 1 tablet (5 mg total) by mouth 2 (two) times daily. 60 tablet 6  . bimatoprost (LUMIGAN) 0.03 % ophthalmic solution Place 1 drop into both eyes at bedtime.      . cholecalciferol (VITAMIN D) 1000 UNITS tablet Take 1,000 Units by mouth daily.      [name] doxazosin (CARDURA) 4 MG tablet TAKE ONE TABLET BY MOUTH ONCE DAILY 90 tablet 1  . esomeprazole (NEXIUM) 40 MG capsule Take 1 capsule (40 mg total) by mouth daily. 30 capsule 6  . latanoprost (XALATAN) 0.005 % ophthalmic solution Place 1 drop into both eyes daily.    [name] lisinopril (PRINIVIL,ZESTRIL) 10 MG tablet TAKE ONE TABLET BY MOUTH ONCE DAILY 90 tablet 1  . lovastatin (MEVACOR) 40 MG tablet TAKE ONE TABLET BY MOUTH ONCE DAILY 90 tablet 1  . metoprolol tartrate (LOPRESSOR) 25 MG tablet TAKE ONE-HALF TABLET BY MOUTH ONCE DAILY OR TAKE AS DIRECTED 90 tablet 1  . Omega-3 Fatty Acids (OMEGA 3 PO) Take 1 capsule by mouth daily.      . timolol (TIMOPTIC) 0.5 % ophthalmic solution Place 1 drop into both eyes daily.     No current facility-administered medications for this visit.    Allergies:   Iodinated diagnostic agents; Lipitor; and Sulfa drugs cross reactors    Social History:  The patient  reports that he has never smoked. He has never used smokeless tobacco. He reports that he does not drink alcohol or use illicit drugs.   Family History:  The patient's family history includes Cancer  in his sister; Coronary artery disease in his brother; Heart attack in his brother and father; Stroke in his mother.    ROS:  Please see the history of present illness.   Otherwise, review of systems are positive for none.   All other systems are reviewed and negative.    PHYSICAL EXAM: VS:  BP 116/56 mmHg  Pulse 64  Ht 5\' 8"  (1.727 m)  Wt 149 lb 6.4 oz (67.767 kg)  BMI 22.72 kg/m2 , BMI Body mass index is 22.72 kg/(m^2). GEN: Well nourished, well developed, in no acute distress HEENT: normal Neck: no JVD, carotid bruits, or masses Cardiac: Irregularly irregular; no murmurs, rubs, or gallops,no edema  Respiratory:  clear to  auscultation bilaterally, normal work of breathing GI: soft, nontender, nondistended, + BS MS: no deformity or atrophy Skin: warm and dry, no rash Neuro:  Strength and sensation are intact Psych: euthymic mood, full affect   EKG:  EKG is not ordered today.    Recent Labs: 04/07/2015: ALT 13; BUN 19; Creatinine, Ser 1.12; Hemoglobin 15.5; Platelets 171.0; Potassium 4.1; Sodium 139    Lipid Panel    Component Value Date/Time   CHOL 127 04/07/2015 0938   TRIG 51.0 04/07/2015 0938   HDL 53.30 04/07/2015 0938   CHOLHDL 2 04/07/2015 0938   VLDL 10.2 04/07/2015 0938   LDLCALC 64 04/07/2015 0938      Wt Readings from Last 3 Encounters:  12/16/15 149 lb 6.4 oz (67.767 kg)  04/12/15 151 lb 12.8 oz (68.856 kg)  10/11/14 154 lb (69.854 kg)       ASSESSMENT AND PLAN:  1. Ischemic heart disease status post anterior wall MI in 1992 treated with angioplasty of his LAD. Patient had an inferior wall MI in 2004 and had a stent to his proximal right coronary artery. No recurrent chest pain or angina.  2. atrial fibrillation status post inability to hold normal sinus rhythm after cardioversion . Strategy is rate control and anticoagulation going forward. 3. mild left ventricular systolic dysfunction with ejection fraction 40% 4. obstructive sleep apnea.  He uses CPAP some nights but not always. 5. Hypercholesterolemia 6.  GERD  Plan: Continue current therapy.  We are checking blood work today.  Following my retirement he will follow-up in 6 months with Dr. Johnny Bridge in [place] for office visit and EKG.   Current medicines are reviewed at length with the patient today.  The patient does not have concerns regarding medicines.  The following changes have been made:  no change  Labs/ tests ordered today include:   Orders Placed This Encounter  Procedures  . Lipid panel  . Hepatic function panel  . Basic metabolic panel  . CBC with Differential/Platelet       Signed, Darlin Coco MD 12/16/2015 10:25 AM    [address], [place], [place]  [address] Phone: [phone]; Fax: [phone]

## 2016-02-04 ENCOUNTER — Other Ambulatory Visit: Payer: Self-pay | Admitting: Cardiology

## 2016-02-12 ENCOUNTER — Emergency Department (HOSPITAL_COMMUNITY): Payer: Medicare Other

## 2016-02-12 ENCOUNTER — Inpatient Hospital Stay (HOSPITAL_COMMUNITY)
Admission: EM | Admit: 2016-02-12 | Discharge: 2016-02-15 | DRG: 381 | Disposition: A | Discharge status: Home / Self Care | Payer: Medicare Other | Attending: Internal Medicine | Admitting: Internal Medicine

## 2016-02-12 ENCOUNTER — Encounter (HOSPITAL_COMMUNITY): Payer: Self-pay | Admitting: Nurse Practitioner

## 2016-02-12 DIAGNOSIS — Z7901 Long term (current) use of anticoagulants: Secondary | ICD-10-CM

## 2016-02-12 DIAGNOSIS — G4730 Sleep apnea, unspecified: Secondary | ICD-10-CM | POA: Diagnosis present

## 2016-02-12 DIAGNOSIS — Z955 Presence of coronary angioplasty implant and graft: Secondary | ICD-10-CM | POA: Diagnosis not present

## 2016-02-12 DIAGNOSIS — J9811 Atelectasis: Secondary | ICD-10-CM | POA: Diagnosis not present

## 2016-02-12 DIAGNOSIS — I481 Persistent atrial fibrillation: Secondary | ICD-10-CM

## 2016-02-12 DIAGNOSIS — Z8249 Family history of ischemic heart disease and other diseases of the circulatory system: Secondary | ICD-10-CM | POA: Diagnosis not present

## 2016-02-12 DIAGNOSIS — I259 Chronic ischemic heart disease, unspecified: Secondary | ICD-10-CM | POA: Diagnosis not present

## 2016-02-12 DIAGNOSIS — I110 Hypertensive heart disease with heart failure: Secondary | ICD-10-CM | POA: Diagnosis present

## 2016-02-12 DIAGNOSIS — N400 Enlarged prostate without lower urinary tract symptoms: Secondary | ICD-10-CM | POA: Diagnosis present

## 2016-02-12 DIAGNOSIS — K2211 Ulcer of esophagus with bleeding: Secondary | ICD-10-CM | POA: Diagnosis not present

## 2016-02-12 DIAGNOSIS — R531 Weakness: Secondary | ICD-10-CM | POA: Diagnosis not present

## 2016-02-12 DIAGNOSIS — R0682 Tachypnea, not elsewhere classified: Secondary | ICD-10-CM | POA: Diagnosis not present

## 2016-02-12 DIAGNOSIS — K922 Gastrointestinal hemorrhage, unspecified: Secondary | ICD-10-CM | POA: Diagnosis not present

## 2016-02-12 DIAGNOSIS — I252 Old myocardial infarction: Secondary | ICD-10-CM

## 2016-02-12 DIAGNOSIS — I482 Chronic atrial fibrillation, unspecified: Secondary | ICD-10-CM | POA: Diagnosis present

## 2016-02-12 DIAGNOSIS — K920 Hematemesis: Secondary | ICD-10-CM | POA: Diagnosis not present

## 2016-02-12 DIAGNOSIS — I10 Essential (primary) hypertension: Secondary | ICD-10-CM | POA: Diagnosis present

## 2016-02-12 DIAGNOSIS — E7800 Pure hypercholesterolemia, unspecified: Secondary | ICD-10-CM | POA: Diagnosis present

## 2016-02-12 DIAGNOSIS — K210 Gastro-esophageal reflux disease with esophagitis: Secondary | ICD-10-CM | POA: Diagnosis present

## 2016-02-12 DIAGNOSIS — Z23 Encounter for immunization: Secondary | ICD-10-CM | POA: Diagnosis not present

## 2016-02-12 DIAGNOSIS — K209 Esophagitis, unspecified: Secondary | ICD-10-CM | POA: Diagnosis not present

## 2016-02-12 DIAGNOSIS — K449 Diaphragmatic hernia without obstruction or gangrene: Secondary | ICD-10-CM | POA: Diagnosis present

## 2016-02-12 DIAGNOSIS — I5022 Chronic systolic (congestive) heart failure: Secondary | ICD-10-CM | POA: Diagnosis present

## 2016-02-12 DIAGNOSIS — R410 Disorientation, unspecified: Secondary | ICD-10-CM | POA: Diagnosis not present

## 2016-02-12 DIAGNOSIS — I2510 Atherosclerotic heart disease of native coronary artery without angina pectoris: Secondary | ICD-10-CM | POA: Diagnosis present

## 2016-02-12 DIAGNOSIS — R112 Nausea with vomiting, unspecified: Secondary | ICD-10-CM | POA: Diagnosis not present

## 2016-02-12 DIAGNOSIS — R404 Transient alteration of awareness: Secondary | ICD-10-CM | POA: Diagnosis not present

## 2016-02-12 LAB — CBC WITH DIFFERENTIAL/PLATELET
BASOS ABS: 0 10*3/uL (ref 0.0–0.1)
BASOS PCT: 0 %
EOS ABS: 0.1 10*3/uL (ref 0.0–0.7)
Eosinophils Relative: 1 %
HCT: 36.7 % — ABNORMAL LOW (ref 39.0–52.0)
HEMOGLOBIN: 12.3 g/dL — AB (ref 13.0–17.0)
LYMPHS ABS: 1.4 10*3/uL (ref 0.7–4.0)
Lymphocytes Relative: 13 %
MCH: 30.8 pg (ref 26.0–34.0)
MCHC: 33.5 g/dL (ref 30.0–36.0)
MCV: 92 fL (ref 78.0–100.0)
Monocytes Absolute: 0.8 10*3/uL (ref 0.1–1.0)
Monocytes Relative: 7 %
NEUTROS ABS: 8.1 10*3/uL — AB (ref 1.7–7.7)
Neutrophils Relative %: 79 %
Platelets: 159 10*3/uL (ref 150–400)
RBC: 3.99 MIL/uL — AB (ref 4.22–5.81)
RDW: 13.3 % (ref 11.5–15.5)
WBC: 10.3 10*3/uL (ref 4.0–10.5)

## 2016-02-12 LAB — COMPREHENSIVE METABOLIC PANEL
ALBUMIN: 3.7 g/dL (ref 3.5–5.0)
ALK PHOS: 44 U/L (ref 38–126)
ALT: 13 U/L — AB (ref 17–63)
ANION GAP: 8 (ref 5–15)
AST: 13 U/L — ABNORMAL LOW (ref 15–41)
BUN: 28 mg/dL — ABNORMAL HIGH (ref 6–20)
CALCIUM: 8.6 mg/dL — AB (ref 8.9–10.3)
CHLORIDE: 107 mmol/L (ref 101–111)
CO2: 26 mmol/L (ref 22–32)
Creatinine, Ser: 0.96 mg/dL (ref 0.61–1.24)
GFR calc Af Amer: >60 mL/min (ref >60)
GFR calc non Af Amer: >60 mL/min (ref >60)
GLUCOSE: 97 mg/dL (ref 65–99)
Potassium: 3.7 mmol/L (ref 3.5–5.1)
SODIUM: 141 mmol/L (ref 135–145)
Total Bilirubin: 0.9 mg/dL (ref 0.3–1.2)
Total Protein: 6 g/dL — ABNORMAL LOW (ref 6.5–8.1)

## 2016-02-12 LAB — URINALYSIS, ROUTINE W REFLEX MICROSCOPIC
Bilirubin Urine: NEGATIVE
GLUCOSE, UA: NEGATIVE mg/dL
Hgb urine dipstick: NEGATIVE
KETONES UR: NEGATIVE mg/dL
LEUKOCYTES UA: NEGATIVE
NITRITE: NEGATIVE
PROTEIN: NEGATIVE mg/dL
Specific Gravity, Urine: 1.027 (ref 1.005–1.030)
pH: 6 (ref 5.0–8.0)

## 2016-02-12 LAB — ABO/RH: ABO/RH(D): O POS

## 2016-02-12 LAB — TYPE AND SCREEN
ABO/RH(D): O POS
Antibody Screen: NEGATIVE

## 2016-02-12 LAB — AMMONIA

## 2016-02-12 LAB — HEMATOCRIT: HCT: 38.7 % — ABNORMAL LOW (ref 39.0–52.0)

## 2016-02-12 LAB — I-STAT TROPONIN, ED: TROPONIN I, POC: 0.02 ng/mL (ref 0.00–0.08)

## 2016-02-12 LAB — HEMOGLOBIN: HEMOGLOBIN: 13 g/dL (ref 13.0–17.0)

## 2016-02-12 LAB — PROTIME-INR
INR: 1.32 (ref 0.00–1.49)
PROTHROMBIN TIME: 16.5 s — AB (ref 11.6–15.2)

## 2016-02-12 MED ORDER — LISINOPRIL 10 MG PO TABS
10.0000 mg | ORAL_TABLET | Freq: Once | ORAL | Status: AC
  Administered 2016-02-12: 10 mg via ORAL
  Filled 2016-02-12: qty 1

## 2016-02-12 MED ORDER — DOXAZOSIN MESYLATE 4 MG PO TABS
4.0000 mg | ORAL_TABLET | Freq: Every day | ORAL | Status: DC
  Administered 2016-02-12 – 2016-02-14 (×3): 4 mg via ORAL
  Filled 2016-02-12 (×4): qty 1

## 2016-02-12 MED ORDER — PANTOPRAZOLE SODIUM 40 MG IV SOLR
40.0000 mg | Freq: Once | INTRAVENOUS | Status: AC
  Administered 2016-02-12: 40 mg via INTRAVENOUS
  Filled 2016-02-12: qty 40

## 2016-02-12 MED ORDER — METOPROLOL TARTRATE 25 MG PO TABS
12.5000 mg | ORAL_TABLET | Freq: Once | ORAL | Status: AC
  Administered 2016-02-12: 12.5 mg via ORAL
  Filled 2016-02-12: qty 1

## 2016-02-12 MED ORDER — ACETAMINOPHEN 325 MG PO TABS: 650.0000 mg | ORAL_TABLET | Freq: Four times a day (QID) | ORAL | Status: DC | PRN

## 2016-02-12 MED ORDER — SODIUM CHLORIDE 0.9 % IV SOLN
INTRAVENOUS | Status: DC
  Administered 2016-02-12: 50 mL/h via INTRAVENOUS
  Administered 2016-02-13: 23:00:00 via INTRAVENOUS

## 2016-02-12 MED ORDER — ONDANSETRON HCL 4 MG PO TABS: 4.0000 mg | ORAL_TABLET | Freq: Four times a day (QID) | ORAL | Status: DC | PRN

## 2016-02-12 MED ORDER — ACETAMINOPHEN 650 MG RE SUPP: 650.0000 mg | Freq: Four times a day (QID) | RECTAL | Status: DC | PRN

## 2016-02-12 MED ORDER — PANTOPRAZOLE SODIUM 40 MG IV SOLR
40.0000 mg | Freq: Two times a day (BID) | INTRAVENOUS | Status: DC
  Administered 2016-02-13 – 2016-02-14 (×4): 40 mg via INTRAVENOUS
  Filled 2016-02-12 (×5): qty 40

## 2016-02-12 MED ORDER — LISINOPRIL 10 MG PO TABS
10.0000 mg | ORAL_TABLET | Freq: Every day | ORAL | Status: DC
  Administered 2016-02-13 – 2016-02-14 (×2): 10 mg via ORAL
  Filled 2016-02-12 (×4): qty 1

## 2016-02-12 MED ORDER — SODIUM CHLORIDE 0.9% FLUSH
3.0000 mL | Freq: Two times a day (BID) | INTRAVENOUS | Status: DC
  Administered 2016-02-12 – 2016-02-13 (×2): 3 mL via INTRAVENOUS

## 2016-02-12 MED ORDER — PRAVASTATIN SODIUM 40 MG PO TABS
40.0000 mg | ORAL_TABLET | Freq: Every day | ORAL | Status: DC
  Administered 2016-02-13 – 2016-02-14 (×2): 40 mg via ORAL
  Filled 2016-02-12 (×3): qty 1

## 2016-02-12 MED ORDER — HYDROMORPHONE HCL 1 MG/ML IJ SOLN
0.5000 mg | INTRAMUSCULAR | Status: DC | PRN
  Administered 2016-02-12: 1 mg via INTRAVENOUS
  Filled 2016-02-12: qty 1

## 2016-02-12 MED ORDER — METOPROLOL TARTRATE 25 MG PO TABS
25.0000 mg | ORAL_TABLET | Freq: Two times a day (BID) | ORAL | Status: DC
  Administered 2016-02-13 – 2016-02-14 (×3): 25 mg via ORAL
  Filled 2016-02-12 (×4): qty 1

## 2016-02-12 MED ORDER — ONDANSETRON HCL 4 MG/2ML IJ SOLN: 4.0000 mg | Freq: Four times a day (QID) | INTRAMUSCULAR | Status: DC | PRN

## 2016-02-12 MED ORDER — OXYCODONE HCL 5 MG PO TABS: 5.0000 mg | ORAL_TABLET | ORAL | Status: DC | PRN

## 2016-02-12 NOTE — H&P (Signed)
[facility] Admission History and Physical       Henry Ayala [id number] DOB: 17-Mar-1931 DOA: 02/12/2016  Referring physician: EDP PCP: Warren Danes, MD  Specialists:   Chief Complaint:  Vomiting Blood  HPI: Henry Ayala is a 82 y.o. male with a history of CAD, Atrial Fibrillation on Eliquis Rx, Systolic CHF, HTN, Hyperlipidemia, and BPH who was brought to the ED due to Hematemesis x 3-4 episodes yesterday.    He denies any ABD Pain, but his daughter is at the bedside and reports that he has had stomach discomfort and indigestion symptoms and poor appetite for about 1 month.    He denies any weakness or dizziness.   He reports that he has not paid attention to his stools.  He had no further episodes of hematemesis today.   In the ED, he was found to have an initial hemoglobin level of 12.3.  [facility] was consulted by the EDP, and will see him in the AM.    His Eliquis Rx is on hold for now.      Review of Systems:  Constitutional: No Weight Loss, No Weight Gain, Night Sweats, Fevers, Chills, Dizziness, Light Headedness, Fatigue, or Generalized Weakness HEENT: No Headaches, Difficulty Swallowing,Tooth/Dental Problems,Sore Throat,  No Sneezing, Rhinitis, Ear Ache, Nasal Congestion, or Post Nasal Drip,  Cardio-vascular:  No Chest pain, Orthopnea, PND, Edema in Lower Extremities, Anasarca, Dizziness, Palpitations  Resp: No Dyspnea, No DOE, No Productive Cough, No Non-Productive Cough, No Hemoptysis, No Wheezing.    GI: No Heartburn, Indigestion, Abdominal Pain, Nausea, Vomiting, Diarrhea, Constipation, +Hematemesis, Hematochezia, Melena, Change in Bowel Habits,  Loss of Appetite  GU: No Dysuria, No Change in Color of Urine, No Urgency or Urinary Frequency, No Flank pain.  Musculoskeletal: No Joint Pain or Swelling, No Decreased Range of Motion, No Back Pain.  Neurologic: No Syncope, No Seizures, Muscle Weakness, Paresthesia, Vision Disturbance or Loss, No Diplopia, No  Vertigo, No Difficulty Walking,  Skin: No Rash or Lesions. Psych: No Change in Mood or Affect, No Depression or Anxiety, No Memory loss, No Confusion, or Hallucinations   Past Medical History  Diagnosis Date  . Hyperlipidemia   . Ischemic heart disease   . Anteroseptal myocardial infarction [facility]) 1992    treated with angioplasty of his LAD   . Myocardial infarction, inferior wall ([place]) 2004    treated with angioplasty and stenting of the proximal right coronary artery  . GERD (gastroesophageal reflux disease)   . Sleep apnea     was told CPAP probably would not help him  . Nephrolithiasis   . BPH (benign prostatic hyperplasia)   . Hiatal hernia   . History of echocardiogram 07/30/2008    Est. EF of 40%  -- LOW RISK NUCLEAR STUDY -- Old anteroseptal and inferoapical scars -- No significant reversible ischemia -- Since last cardiolite of 07/02/07, EF has increased from 32% to 40% -- Continue aggressive medical therapy -- Marcello Moores A. Mare Ferrari, MD  . Hypertension      Past Surgical History  Procedure Laterality Date  . Total knee arthroplasty      right  . Cardiac catheterization  10/30/2004    Est. EF was 35-40% -- Coronary and left ventricular angiography.  . Cardiac catheterization  1994     followup cardiac catheterization showed nonobstructive disease  . Cardiac catheterization  1995  . Cardiac catheterization  2004    EF was depressed at 37% -- stenting of the right coronary artery at [facility]  . Cardiac catheterization  10/30/2004    -- EF is estimated at 35-40% --  Left ventricular angiography performed in the RAO view demonstrates moderate hypokinesia involving the anterolateral wall & inferior wall  -- with no significant mitral insufficiency. -- Two vessel atherosclerotic coronary artery disease, excellent long-term patency of the prior angioplasty site in the LAD  --  Moderate left ventricular dysfunction   . Cardioversion N/A 06/30/2014    Procedure:  CARDIOVERSION;  Surgeon: Sanda Klein, MD;  Location: [facility];  Service: Cardiovascular;  Laterality: N/A;      Prior to Admission medications   Medication Sig Start Date End Date Taking? Authorizing Provider  bimatoprost (LUMIGAN) 0.03 % ophthalmic solution Place 1 drop into both eyes at bedtime.      Historical Provider, MD  cholecalciferol (VITAMIN D) 1000 UNITS tablet Take 1,000 Units by mouth daily.      Historical Provider, MD  doxazosin (CARDURA) 4 MG tablet TAKE ONE TABLET BY MOUTH ONCE DAILY 09/15/15   Darlin Coco, MD  ELIQUIS 5 MG TABS tablet TAKE ONE TABLET BY MOUTH TWICE DAILY 02/06/16   Darlin Coco, MD  esomeprazole (NEXIUM) 40 MG capsule TAKE ONE CAPSULE BY MOUTH DAILY 02/06/16   Darlin Coco, MD  latanoprost (XALATAN) 0.005 % ophthalmic solution Place 1 drop into both eyes daily. 01/31/15   Historical Provider, MD  lisinopril (PRINIVIL,ZESTRIL) 10 MG tablet TAKE ONE TABLET BY MOUTH ONCE DAILY 11/08/15   Darlin Coco, MD  lovastatin (MEVACOR) 40 MG tablet TAKE ONE TABLET BY MOUTH ONCE DAILY 11/08/15   Darlin Coco, MD  metoprolol tartrate (LOPRESSOR) 25 MG tablet TAKE ONE-HALF TABLET BY MOUTH ONCE DAILY OR TAKE AS DIRECTED 11/08/15   Darlin Coco, MD  Omega-3 Fatty Acids (OMEGA 3 PO) Take 1 capsule by mouth daily.      Historical Provider, MD  timolol (TIMOPTIC) 0.5 % ophthalmic solution Place 1 drop into both eyes daily. 01/31/15   Historical Provider, MD     Allergies  Allergen Reactions  . Iodinated Diagnostic Agents     unknown  . Lipitor [Atorvastatin Calcium]     unknown  . Sulfa Drugs Cross Reactors     unknown    Social History:  reports that he has never smoked. He has never used smokeless tobacco. He reports that he does not drink alcohol or use illicit drugs.    Family History  Problem Relation Age of Onset  . Heart attack Father   . Stroke Mother   . Heart attack Brother   . Cancer Sister     colon cancer  . Coronary artery  disease Brother     has had multiple coronary stents placed       Physical Exam:  GEN:  Pleasant Elderly Well Nourished and Well Developed 82 y.o. Caucasian male examined and in no acute distress; cooperative with exam Filed Vitals:   02/12/16 1605 02/12/16 1803  BP: 107/75 210/171  Pulse: 76 56  Temp: 97.8 F (36.6 C)   TempSrc: Oral   Resp: 18 19  SpO2: 98% 97%   Blood pressure 210/171, pulse 56, temperature 97.8 F (36.6 C), temperature source Oral, resp. rate 19, SpO2 97 %. PSYCH: He is alert and oriented x4; does not appear anxious does not appear depressed; affect is normal HEENT: Normocephalic and Atraumatic, Mucous membranes pink; PERRLA; EOM intact; Fundi:  Benign;  No scleral icterus, Nares: Patent, Oropharynx: Clear, Fair Dentition,    Neck:  FROM, No Cervical  Lymphadenopathy nor Thyromegaly or Carotid Bruit; No JVD; Breasts:: Not examined CHEST WALL: No tenderness CHEST: Normal respiration, clear to auscultation bilaterally HEART: Regular rate and rhythm; no murmurs rubs or gallops BACK: No kyphosis or scoliosis; No CVA tenderness ABDOMEN: Positive Bowel Sounds, Soft Non-Tender, No Rebound or Guarding; No Masses, No Organomegaly Rectal Exam: Not done EXTREMITIES: No  Cyanosis, Clubbing, or Edema; No Ulcerations. Genitalia: not examined PULSES: 2+ and symmetric SKIN: Normal hydration no rash or ulceration CNS:  Alert and Oriented x 4, No Focal Deficits Vascular: pulses palpable throughout    Labs on Admission:  Basic Metabolic Panel:  Recent Labs Lab 02/12/16 1638  NA 141  K 3.7  CL 107  CO2 26  GLUCOSE 97  BUN 28*  CREATININE 0.96  CALCIUM 8.6*   Liver Function Tests:  Recent Labs Lab 02/12/16 1638  AST 13*  ALT 13*  ALKPHOS 44  BILITOT 0.9  PROT 6.0*  ALBUMIN 3.7   No results for input(s): LIPASE, AMYLASE in the last 168 hours.  Recent Labs Lab 02/12/16 1655  AMMONIA <9*   CBC:  Recent Labs Lab 02/12/16 1638  WBC 10.3    NEUTROABS 8.1*  HGB 12.3*  HCT 36.7*  MCV 92.0  PLT 159   Cardiac Enzymes: No results for input(s): CKTOTAL, CKMB, CKMBINDEX, TROPONINI in the last 168 hours.  BNP (last 3 results) No results for input(s): BNP in the last 8760 hours.  ProBNP (last 3 results) No results for input(s): PROBNP in the last 8760 hours.  CBG: No results for input(s): GLUCAP in the last 168 hours.  Radiological Exams on Admission: Dg Chest 2 View  02/12/2016  CLINICAL DATA:  Mental status changes. EXAM: CHEST  2 VIEW COMPARISON:  11/02/2010 FINDINGS: The cardiac silhouette, mediastinal and hilar contours are within normal limits and stable. There is a large and enlarging hiatal hernia. Part of the colon appears to be in the hernia also. Streaky bibasilar atelectasis but no infiltrates, edema or effusions. The bony thorax is intact. IMPRESSION: Large hiatal hernia, increased in size since prior chest x-ray and appears to contain part of the transverse colon. Streaky bibasilar atelectasis but no infiltrates, edema or effusions. Electronically Signed   By: Marijo Sanes M.D.   On: 02/12/2016 17:29   Ct Head Wo Contrast  02/12/2016  CLINICAL DATA:  Transient memory loss and confusion noticed today. EXAM: CT HEAD WITHOUT CONTRAST TECHNIQUE: Contiguous axial images were obtained from the base of the skull through the vertex without intravenous contrast. COMPARISON:  None. FINDINGS: Brain: There is mild generalized brain atrophy with commensurate dilatation of the ventricles and sulci. Mild chronic small vessel ischemic change noted within the deep periventricular white matter regions. There is no mass, hemorrhage, edema or other evidence of acute parenchymal abnormality. No extra-axial hemorrhage. Vascular: No hyperdense vessel or unexpected calcification. There are chronic calcified atherosclerotic changes of the large vessels at the skull base. Skull: Negative for fracture or focal lesion. Sinuses/Orbits: Visualized upper  paranasal sinuses are clear. Mastoid air cells are clear. Other: None. IMPRESSION: 1. No acute findings.  No intracranial mass, hemorrhage, or edema. 2. Mild atrophy and chronic small vessel ischemic changes in the deep white matter. Electronically Signed   By: Franki Cabot M.D.   On: 02/12/2016 17:00      Assessment/Plan:   82 y.o. male with  Principal Problem:    Upper GI bleed   Cardiac Monitoring   IV Protonix   Hold Eliquis Rx  Transfuse PRN   Monitoring H/H   [facility] to see in AM   Active Problems:    Atrial fibrillation ([name])   On Metoprolol   Eliquis Rx on Hold      Anticoagulant long-term use- on Eliquis   Hold Eliquis      Ischemic heart disease   On Metoprolol, Lisinopril, Mevacor, and Omega 3 Fatty Acids        Benign essential HTN   On Metoprolol, Lisinopril, Cardura Rx   Monitor BPs     Hypercholesterolemia   Continue Mevacor Rx      DVT Prophylaxis   SCDs        Code Status:     FULL CODE      Family Communication:   Family at Bedside    Disposition Plan:    Inpatient Status        Time spent:  Vici C [facility] Pager [phone]   If 7AM -7PM Please Contact the Day Rounding Team MD for [facility]  If 7PM-7AM, Please Contact Night-Floor Coverage  [email or web address] Password [email or web address] 02/12/2016, 7:09 PM     ADDENDUM:   Patient was seen and examined on 02/12/2016

## 2016-02-12 NOTE — ED Notes (Signed)
Bed: WA20 Expected date:  Expected time:  Means of arrival:  Comments: EMS 

## 2016-02-12 NOTE — ED Notes (Signed)
Pt is presented from urgent Care for for further evaluation, family reported that pt is seemingly confused and is not eating. Pt at this time is AOx4 and without complaints. Of note pt takes eliquis for Afib. Denies pain, urinary frequency endorses diarrhea.

## 2016-02-12 NOTE — ED Provider Notes (Signed)
CSN: [id number]     Arrival date & time 02/12/16  1549 History   First MD Initiated Contact with Patient 02/12/16 1605     Chief Complaint  Patient presents with  . No Appetite   . Suspected Confusion      (Consider location/radiation/quality/duration/timing/severity/associated sxs/prior Treatment) HPI   She is an 82 year old male presenting for unknown reasons. Patient was brought here by [employer]. Reportedly he went to [facility] urgent care with his nephew. Patient's nephew is not with him at this time. Patient unable to tell us why he is here today.  He states it's because he had one episode of dark vomit yesterday.   The notes from [place] say that he has had confusion. He is alert and oriented 3 on arrival here.    Level V caveat altered mental status.  Past Medical History  Diagnosis Date  . Hyperlipidemia   . Ischemic heart disease   . Anteroseptal myocardial infarction [facility]) 1992    treated with angioplasty of his LAD   . Myocardial infarction, inferior wall ([place]) 2004    treated with angioplasty and stenting of the proximal right coronary artery  . GERD (gastroesophageal reflux disease)   . Sleep apnea     was told CPAP probably would not help him  . Nephrolithiasis   . BPH (benign prostatic hyperplasia)   . Hiatal hernia   . History of echocardiogram 07/30/2008    Est. EF of 40%  -- LOW RISK NUCLEAR STUDY -- Old anteroseptal and inferoapical scars -- No significant reversible ischemia -- Since last cardiolite of 07/02/07, EF has increased from 32% to 40% -- Continue aggressive medical therapy -- Marcello Moores A. Mare Ferrari, MD  . Hypertension    Past Surgical History  Procedure Laterality Date  . Total knee arthroplasty      right  . Cardiac catheterization  10/30/2004    Est. EF was 35-40% -- Coronary and left ventricular angiography.  . Cardiac catheterization  1994     followup cardiac catheterization showed nonobstructive disease  . Cardiac  catheterization  1995  . Cardiac catheterization  2004    EF was depressed at 37% -- stenting of the right coronary artery at [facility]  . Cardiac catheterization  10/30/2004    -- EF is estimated at 35-40% --  Left ventricular angiography performed in the RAO view demonstrates moderate hypokinesia involving the anterolateral wall & inferior wall  -- with no significant mitral insufficiency. -- Two vessel atherosclerotic coronary artery disease, excellent long-term patency of the prior angioplasty site in the LAD  --  Moderate left ventricular dysfunction   . Cardioversion N/A 06/30/2014    Procedure: CARDIOVERSION;  Surgeon: Sanda Klein, MD;  Location: [facility];  Service: Cardiovascular;  Laterality: N/A;   Family History  Problem Relation Age of Onset  . Heart attack Father   . Stroke Mother   . Heart attack Brother   . Cancer Sister     colon cancer  . Coronary artery disease Brother     has had multiple coronary stents placed   Social History  Substance Use Topics  . Smoking status: Never Smoker   . Smokeless tobacco: Never Used  . Alcohol Use: No    Review of Systems  Constitutional: Negative for activity change.  HENT: Negative for congestion.   Eyes: Negative for visual disturbance.  Respiratory: Negative for shortness of breath.   Cardiovascular: Negative for chest pain.  Gastrointestinal: Positive for vomiting.  Negative for nausea, abdominal pain and diarrhea.  Musculoskeletal: Negative for back pain.  Neurological: Negative for weakness.  Psychiatric/Behavioral: Negative for agitation.      Allergies  Iodinated diagnostic agents; Lipitor; and Sulfa drugs cross reactors  Home Medications   Prior to Admission medications   Medication Sig Start Date End Date Taking? Authorizing Provider  bimatoprost (LUMIGAN) 0.03 % ophthalmic solution Place 1 drop into both eyes at bedtime.      Historical Provider, MD  cholecalciferol (VITAMIN D)  1000 UNITS tablet Take 1,000 Units by mouth daily.      Historical Provider, MD  doxazosin (CARDURA) 4 MG tablet TAKE ONE TABLET BY MOUTH ONCE DAILY 09/15/15   Darlin Coco, MD  ELIQUIS 5 MG TABS tablet TAKE ONE TABLET BY MOUTH TWICE DAILY 02/06/16   Darlin Coco, MD  esomeprazole (NEXIUM) 40 MG capsule TAKE ONE CAPSULE BY MOUTH DAILY 02/06/16   Darlin Coco, MD  latanoprost (XALATAN) 0.005 % ophthalmic solution Place 1 drop into both eyes daily. 01/31/15   Historical Provider, MD  lisinopril (PRINIVIL,ZESTRIL) 10 MG tablet TAKE ONE TABLET BY MOUTH ONCE DAILY 11/08/15   Darlin Coco, MD  lovastatin (MEVACOR) 40 MG tablet TAKE ONE TABLET BY MOUTH ONCE DAILY 11/08/15   Darlin Coco, MD  metoprolol tartrate (LOPRESSOR) 25 MG tablet TAKE ONE-HALF TABLET BY MOUTH ONCE DAILY OR TAKE AS DIRECTED 11/08/15   Darlin Coco, MD  Omega-3 Fatty Acids (OMEGA 3 PO) Take 1 capsule by mouth daily.      Historical Provider, MD  timolol (TIMOPTIC) 0.5 % ophthalmic solution Place 1 drop into both eyes daily. 01/31/15   Historical Provider, MD   BP 210/171 mmHg  Pulse 56  Temp(Src) 97.8 F (36.6 C) (Oral)  Resp 19  SpO2 97% Physical Exam  Constitutional: He is oriented to person, place, and time. He appears well-nourished.  HENT:  Head: Normocephalic.  Mouth/Throat: Oropharynx is clear and moist.  Eyes: Conjunctivae are normal.  Neck: No tracheal deviation present.  Cardiovascular: Normal rate.   Pulmonary/Chest: Effort normal. No stridor. No respiratory distress.  Abdominal: Soft. There is no tenderness. There is no guarding.  Musculoskeletal: Normal range of motion. He exhibits no edema.  Neurological: He is oriented to person, place, and time. No cranial nerve deficit.  Cranial nerves intact. Patient alert and oriented 3.  Skin: Skin is warm and dry. No rash noted. He is not diaphoretic.  Psychiatric: He has a normal mood and affect. His behavior is normal.  Nursing note and vitals  reviewed.   ED Course  Procedures (including critical care time) Labs Review Labs Reviewed  COMPREHENSIVE METABOLIC PANEL - Abnormal; Notable for the following:    BUN 28 (*)    Calcium 8.6 (*)    Total Protein 6.0 (*)    AST 13 (*)    ALT 13 (*)    All other components within normal limits  CBC WITH DIFFERENTIAL/PLATELET - Abnormal; Notable for the following:    RBC 3.99 (*)    Hemoglobin 12.3 (*)    HCT 36.7 (*)    Neutro Abs 8.1 (*)    All other components within normal limits  PROTIME-INR - Abnormal; Notable for the following:    Prothrombin Time 16.5 (*)    All other components within normal limits  AMMONIA - Abnormal; Notable for the following:    Ammonia <9 (*)    All other components within normal limits  URINALYSIS, ROUTINE W REFLEX MICROSCOPIC (NOT AT [facility])  I-STAT  TROPOININ, ED  TYPE AND SCREEN  ABO/RH    Imaging Review Dg Chest 2 View  02/12/2016  CLINICAL DATA:  Mental status changes. EXAM: CHEST  2 VIEW COMPARISON:  11/02/2010 FINDINGS: The cardiac silhouette, mediastinal and hilar contours are within normal limits and stable. There is a large and enlarging hiatal hernia. Part of the colon appears to be in the hernia also. Streaky bibasilar atelectasis but no infiltrates, edema or effusions. The bony thorax is intact. IMPRESSION: Large hiatal hernia, increased in size since prior chest x-ray and appears to contain part of the transverse colon. Streaky bibasilar atelectasis but no infiltrates, edema or effusions. Electronically Signed   By: Marijo Sanes M.D.   On: 02/12/2016 17:29   Ct Head Wo Contrast  02/12/2016  CLINICAL DATA:  Transient memory loss and confusion noticed today. EXAM: CT HEAD WITHOUT CONTRAST TECHNIQUE: Contiguous axial images were obtained from the base of the skull through the vertex without intravenous contrast. COMPARISON:  None. FINDINGS: Brain: There is mild generalized brain atrophy with commensurate dilatation of the ventricles and sulci.  Mild chronic small vessel ischemic change noted within the deep periventricular white matter regions. There is no mass, hemorrhage, edema or other evidence of acute parenchymal abnormality. No extra-axial hemorrhage. Vascular: No hyperdense vessel or unexpected calcification. There are chronic calcified atherosclerotic changes of the large vessels at the skull base. Skull: Negative for fracture or focal lesion. Sinuses/Orbits: Visualized upper paranasal sinuses are clear. Mastoid air cells are clear. Other: None. IMPRESSION: 1. No acute findings.  No intracranial mass, hemorrhage, or edema. 2. Mild atrophy and chronic small vessel ischemic changes in the deep white matter. Electronically Signed   By: Franki Cabot M.D.   On: 02/12/2016 17:00   I have personally reviewed and evaluated these images and lab results as part of my medical decision-making.   EKG Interpretation   Date/Time:  Sunday February 12 2016 16:02:46 EDT Ventricular Rate:  80 PR Interval:    QRS Duration: 108 QT Interval:  388 QTC Calculation: 448 R Axis:   110 Text Interpretation:  Atrial fibrillation Inferior infarct, old Probable  lateral infarct, age indeterminate Anterior infarct, old No significant  change since last tracing Confirmed by Gerald Leitz ([id number]) on  02/12/2016 4:49:53 PM      MDM   Final diagnoses:  Gastrointestinal hemorrhage, unspecified gastritis, unspecified gastrointestinal hemorrhage type   This very pleasant 81 year old male presenting front and reasons. Patient is alert and oriented 3 and said that he was seen at [facility] urgent care and was unsure why he was sent here. Patient had a nephew with him at urgen care, but is no longer with him at this time.  We will try to/figure out for what reason the patient is here by finding the nephew.   6:53 PM Patient's nephew daughter here. They confirm the patient had a couple episodes of vomiting that was dark color yesterday. No episodes today.  That is why he came here for evaluation. Patient is on Eliquis.  Has not taken any BP meds in a couple days.   [facility] called.   Murl Golladay Julio Alm, MD 02/12/16 [id number]

## 2016-02-13 DIAGNOSIS — I482 Chronic atrial fibrillation: Secondary | ICD-10-CM

## 2016-02-13 DIAGNOSIS — I10 Essential (primary) hypertension: Secondary | ICD-10-CM

## 2016-02-13 DIAGNOSIS — I259 Chronic ischemic heart disease, unspecified: Secondary | ICD-10-CM

## 2016-02-13 DIAGNOSIS — K922 Gastrointestinal hemorrhage, unspecified: Secondary | ICD-10-CM | POA: Insufficient documentation

## 2016-02-13 DIAGNOSIS — Z7901 Long term (current) use of anticoagulants: Secondary | ICD-10-CM

## 2016-02-13 DIAGNOSIS — E7800 Pure hypercholesterolemia, unspecified: Secondary | ICD-10-CM

## 2016-02-13 LAB — CBC
HEMATOCRIT: 34 % — AB (ref 39.0–52.0)
HEMATOCRIT: 36.3 % — AB (ref 39.0–52.0)
Hemoglobin: 11.6 g/dL — ABNORMAL LOW (ref 13.0–17.0)
Hemoglobin: 12.3 g/dL — ABNORMAL LOW (ref 13.0–17.0)
MCH: 31.2 pg (ref 26.0–34.0)
MCH: 31.6 pg (ref 26.0–34.0)
MCHC: 34.1 g/dL (ref 30.0–36.0)
MCHC: 33.9 g/dL (ref 30.0–36.0)
MCV: 91.4 fL (ref 78.0–100.0)
MCV: 93.3 fL (ref 78.0–100.0)
PLATELETS: 146 10*3/uL — AB (ref 150–400)
PLATELETS: 162 10*3/uL (ref 150–400)
RBC: 3.72 MIL/uL — ABNORMAL LOW (ref 4.22–5.81)
RBC: 3.89 MIL/uL — ABNORMAL LOW (ref 4.22–5.81)
RDW: 13.5 % (ref 11.5–15.5)
RDW: 13.8 % (ref 11.5–15.5)
WBC: 7.4 10*3/uL (ref 4.0–10.5)
WBC: 8 10*3/uL (ref 4.0–10.5)

## 2016-02-13 LAB — BASIC METABOLIC PANEL
Anion gap: 6 (ref 5–15)
BUN: 22 mg/dL — AB (ref 6–20)
CO2: 27 mmol/L (ref 22–32)
Calcium: 8.5 mg/dL — ABNORMAL LOW (ref 8.9–10.3)
Chloride: 109 mmol/L (ref 101–111)
Creatinine, Ser: 1.08 mg/dL (ref 0.61–1.24)
Glucose, Bld: 97 mg/dL (ref 65–99)
POTASSIUM: 3.3 mmol/L — AB (ref 3.5–5.1)
SODIUM: 142 mmol/L (ref 135–145)

## 2016-02-13 LAB — OCCULT BLOOD X 1 CARD TO LAB, STOOL: Fecal Occult Bld: POSITIVE — AB

## 2016-02-13 MED ORDER — PNEUMOCOCCAL VAC POLYVALENT 25 MCG/0.5ML IJ INJ
0.5000 mL | INJECTION | INTRAMUSCULAR | Status: AC
  Administered 2016-02-14: 0.5 mL via INTRAMUSCULAR
  Filled 2016-02-13 (×2): qty 0.5

## 2016-02-13 NOTE — Progress Notes (Signed)
[facility] PROGRESS NOTE    Progress Note   Henry Ayala [id number] DOB: 1931/08/07 DOA: 02/12/2016 PCP: Warren Danes, MD   Brief Narrative:   Henry Ayala is an 82 y.o. male past medical history of ischemic heart disease, chronic atrial fibrillation on Eluquis and chronic systolic heart failure that comes in for hematemesis that started the day prior to admission.  Assessment/Plan:   Upper GI bleed: GI was consulted relates she only had a single episode of vomiting with some suspicion for GI bleed. There is no ongoing vomiting so they recommended to monitor CBCs, advance diet and hold Eluquis. Continue PPI. His hemoglobin back in 12/16/2015 was 13 on admission it was 12, after IV hydration is now 11.6.  Continue check cc every 2 hours.  Ischemic heart disease/chronic systolic heart failure with an EF of 40%: Blood pressure seems to be stable, continue metoprolol and lisinopril. She is not on a diuretic  Hypercholesterolemia: Continue statins.  Chornic  Atrial fibrillation [facility]): Rate controlled with metoprolol, continue to hold Eluquis.  Benign essential HTN Blood pressure seems to be well controlled continue current regimen. He was initially high on admission but now is ranging from 102/47 to 112/86.  DVT Prophylaxis Place SCDs.   Family Communication: none Disposition Plan: Unable to determined.. Code Status:     Code Status Orders        Start     Ordered   02/12/16 2112  Full code   Continuous     02/12/16 2111    Code Status History    Date Active Date Inactive Code Status Order ID Comments User Context   This patient has a current code status but no historical code status.        IV Access:    Peripheral IV   Procedures and diagnostic studies:   Dg Chest 2 View  02/12/2016  CLINICAL DATA:  Mental status changes. EXAM: CHEST  2 VIEW COMPARISON:  11/02/2010 FINDINGS: The cardiac silhouette, mediastinal and hilar contours are  within normal limits and stable. There is a large and enlarging hiatal hernia. Part of the colon appears to be in the hernia also. Streaky bibasilar atelectasis but no infiltrates, edema or effusions. The bony thorax is intact. IMPRESSION: Large hiatal hernia, increased in size since prior chest x-ray and appears to contain part of the transverse colon. Streaky bibasilar atelectasis but no infiltrates, edema or effusions. Electronically Signed   By: Marijo Sanes M.D.   On: 02/12/2016 17:29   Ct Head Wo Contrast  02/12/2016  CLINICAL DATA:  Transient memory loss and confusion noticed today. EXAM: CT HEAD WITHOUT CONTRAST TECHNIQUE: Contiguous axial images were obtained from the base of the skull through the vertex without intravenous contrast. COMPARISON:  None. FINDINGS: Brain: There is mild generalized brain atrophy with commensurate dilatation of the ventricles and sulci. Mild chronic small vessel ischemic change noted within the deep periventricular white matter regions. There is no mass, hemorrhage, edema or other evidence of acute parenchymal abnormality. No extra-axial hemorrhage. Vascular: No hyperdense vessel or unexpected calcification. There are chronic calcified atherosclerotic changes of the large vessels at the skull base. Skull: Negative for fracture or focal lesion. Sinuses/Orbits: Visualized upper paranasal sinuses are clear. Mastoid air cells are clear. Other: None. IMPRESSION: 1. No acute findings.  No intracranial mass, hemorrhage, or edema. 2. Mild atrophy and chronic small vessel ischemic changes in the deep white matter. Electronically Signed   By: Franki Cabot M.D.   On:  02/12/2016 17:00     Medical Consultants:    None.  Anti-Infectives:   Anti-infectives    None      Subjective:    Henry Ayala is no abdominal pain he relates he has been throwing up almost a year. He recently had coffee-ground emesis 5 days prior to admission.  Objective:    Filed Vitals:    02/12/16 2100 02/12/16 2156 02/13/16 0349 02/13/16 0919  BP: 122/85 143/57 102/47 112/86  Pulse: 85 70 84 89  Temp: 97.4 F (36.3 C) 97.4 F (36.3 C) 98.4 F (36.9 C)   TempSrc: Oral Oral Oral   Resp: 18 19 19    Height: 5\' 7"  (1.702 m)     Weight: 66.996 kg (147 lb 11.2 oz)     SpO2: 95% 98% 97%     Intake/Output Summary (Last 24 hours) at 02/13/16 1011 Last data filed at 02/13/16 0700  Gross per 24 hour  Intake 1075.83 ml  Output    750 ml  Net 325.83 ml   Filed Weights   02/12/16 2100  Weight: 66.996 kg (147 lb 11.2 oz)    Exam: Gen:  NAD Cardiovascular:  RRR. Chest and lungs:   Good air movement clear to auscultation. Abdomen:  Abdomen soft, NT/ND, + BS Extremities:  No edema  Data Reviewed:    Labs: Basic Metabolic Panel:  Recent Labs Lab 02/12/16 1638 02/13/16 0501  NA 141 142  K 3.7 3.3*  CL 107 109  CO2 26 27  GLUCOSE 97 97  BUN 28* 22*  CREATININE 0.96 1.08  CALCIUM 8.6* 8.5*   GFR Estimated Creatinine Clearance: 49.3 mL/min (by C-G formula based on Cr of 1.08). Liver Function Tests:  Recent Labs Lab 02/12/16 1638  AST 13*  ALT 13*  ALKPHOS 44  BILITOT 0.9  PROT 6.0*  ALBUMIN 3.7   No results for input(s): LIPASE, AMYLASE in the last 168 hours.  Recent Labs Lab 02/12/16 1655  AMMONIA <9*   Coagulation profile  Recent Labs Lab 02/12/16 1638  INR 1.32    CBC:  Recent Labs Lab 02/12/16 1638 02/12/16 2133 02/13/16 0501  WBC 10.3  --  7.4  NEUTROABS 8.1*  --   --   HGB 12.3* 13.0 11.6*  HCT 36.7* 38.7* 34.0*  MCV 92.0  --  91.4  PLT 159  --  146*   Cardiac Enzymes: No results for input(s): CKTOTAL, CKMB, CKMBINDEX, TROPONINI in the last 168 hours. BNP (last 3 results) No results for input(s): PROBNP in the last 8760 hours. CBG: No results for input(s): GLUCAP in the last 168 hours. D-Dimer: No results for input(s): DDIMER in the last 72 hours. Hgb A1c: No results for input(s): HGBA1C in the last 72  hours. Lipid Profile: No results for input(s): CHOL, HDL, LDLCALC, TRIG, CHOLHDL, LDLDIRECT in the last 72 hours. Thyroid function studies: No results for input(s): TSH, T4TOTAL, T3FREE, THYROIDAB in the last 72 hours.  Invalid input(s): FREET3 Anemia work up: No results for input(s): VITAMINB12, FOLATE, FERRITIN, TIBC, IRON, RETICCTPCT in the last 72 hours. Sepsis Labs:  Recent Labs Lab 02/12/16 1638 02/13/16 0501  WBC 10.3 7.4   Microbiology No results found for this or any previous visit (from the past 240 hour(s)).   Medications:   . doxazosin  4 mg Oral Daily  . lisinopril  10 mg Oral Daily  . metoprolol tartrate  25 mg Oral BID  . pantoprazole (PROTONIX) IV  40 mg Intravenous Q12H  .  pravastatin  40 mg Oral q1800  . sodium chloride flush  3 mL Intravenous Q12H   Continuous Infusions: . sodium chloride 50 mL/hr (02/12/16 2129)    Time spent: 25 min   LOS: 1 day   Charlynne Cousins  [facility] Pager [phone]  *Please refer to [email or web address], password [email or web address] to get updated schedule on who will round on this patient, as hospitalists switch teams weekly. If 7PM-7AM, please contact night-coverage at [email or web address], password [email or web address] for any overnight needs.  02/13/2016, 10:11 AM

## 2016-02-13 NOTE — Consult Note (Signed)
[facility] Consult Note  Referring Provider: No ref. provider found Primary Care Physician:  Warren Danes, MD Primary Gastroenterologist:  Dr.  Laurel Dimmer Complaint: Vomited once, described as black by nephew HPI: Henry Ayala is an 82 y.o. white male  after developing abdominal's distention after which he became nauseated and actually states that he induced vomiting. This was apparently described as dark. He has not had a bowel movement in 2 days and felt better after vomiting. He is hungry and denies any abdominal pain. He had an EGD by Dr. Oletta Lamas 3 years ago which showed a large hiatal hernia and J-shaped stomach. He is on Eliquis and also on omeprazole.  Past Medical History  Diagnosis Date  . Hyperlipidemia   . Ischemic heart disease   . Anteroseptal myocardial infarction [facility]) 1992    treated with angioplasty of his LAD   . Myocardial infarction, inferior wall ([place]) 2004    treated with angioplasty and stenting of the proximal right coronary artery  . GERD (gastroesophageal reflux disease)   . Sleep apnea     was told CPAP probably would not help him  . Nephrolithiasis   . BPH (benign prostatic hyperplasia)   . Hiatal hernia   . History of echocardiogram 07/30/2008    Est. EF of 40%  -- LOW RISK NUCLEAR STUDY -- Old anteroseptal and inferoapical scars -- No significant reversible ischemia -- Since last cardiolite of 07/02/07, EF has increased from 32% to 40% -- Continue aggressive medical therapy -- Marcello Moores A. Mare Ferrari, MD  . Hypertension     Past Surgical History  Procedure Laterality Date  . Total knee arthroplasty      right  . Cardiac catheterization  10/30/2004    Est. EF was 35-40% -- Coronary and left ventricular angiography.  . Cardiac catheterization  1994     followup cardiac catheterization showed nonobstructive disease  . Cardiac catheterization  1995  . Cardiac catheterization  2004    EF was depressed at 37% -- stenting of the right coronary  artery at [facility]  . Cardiac catheterization  10/30/2004    -- EF is estimated at 35-40% --  Left ventricular angiography performed in the RAO view demonstrates moderate hypokinesia involving the anterolateral wall & inferior wall  -- with no significant mitral insufficiency. -- Two vessel atherosclerotic coronary artery disease, excellent long-term patency of the prior angioplasty site in the LAD  --  Moderate left ventricular dysfunction   . Cardioversion N/A 06/30/2014    Procedure: CARDIOVERSION;  Surgeon: Sanda Klein, MD;  Location: [facility] ENDOSCOPY;  Service: Cardiovascular;  Laterality: N/A;    Medications Prior to Admission  Medication Sig Dispense Refill  . acetaminophen (TYLENOL) 500 MG tablet Take 500 mg by mouth every 6 (six) hours as needed for moderate pain or headache.    . doxazosin (CARDURA) 4 MG tablet TAKE ONE TABLET BY MOUTH ONCE DAILY 90 tablet 1  . ELIQUIS 5 MG TABS tablet TAKE ONE TABLET BY MOUTH TWICE DAILY (Patient taking differently: take 10 mgs once daily) 60 tablet 10  . esomeprazole (NEXIUM) 40 MG capsule TAKE ONE CAPSULE BY MOUTH DAILY 30 capsule 10  . lisinopril (PRINIVIL,ZESTRIL) 10 MG tablet TAKE ONE TABLET BY MOUTH ONCE DAILY 90 tablet 1  . loperamide (IMODIUM A-D) 2 MG tablet Take 2 mg by mouth 4 (four) times daily as needed for diarrhea or loose stools.    . lovastatin (MEVACOR) 40 MG tablet TAKE ONE TABLET BY MOUTH ONCE  DAILY 90 tablet 1  . metoprolol tartrate (LOPRESSOR) 25 MG tablet TAKE ONE-HALF TABLET BY MOUTH ONCE DAILY OR TAKE AS DIRECTED 90 tablet 1    Allergies:  Allergies  Allergen Reactions  . Iodinated Diagnostic Agents     unknown  . Lipitor [Atorvastatin Calcium]     unknown  . Sulfa Drugs Cross Reactors     unknown    Family History  Problem Relation Age of Onset  . Heart attack Father   . Stroke Mother   . Heart attack Brother   . Cancer Sister     colon cancer  . Coronary artery disease Brother     has  had multiple coronary stents placed    Social History:  reports that he has never smoked. He has never used smokeless tobacco. He reports that he does not drink alcohol or use illicit drugs.  Review of Systems: negative except As above   Blood pressure 102/47, pulse 84, temperature 98.4 F (36.9 C), temperature source Oral, resp. rate 19, height 5' 7"  (1.702 m), weight 66.996 kg (147 lb 11.2 oz), SpO2 97 %. Head: Normocephalic, without obvious abnormality, atraumatic Neck: no adenopathy, no carotid bruit, no JVD, supple, symmetrical, trachea midline and thyroid not enlarged, symmetric, no tenderness/mass/nodules Resp: clear to auscultation bilaterally Cardio: regular rate and rhythm, S1, S2 normal, no murmur, click, rub or gallop GI: Abdomen soft nondistended with normoactive bowel sounds. No hepatosplenomegaly mass or guarding. Extremities: extremities normal, atraumatic, no cyanosis or edema  Results for orders placed or performed during the hospital encounter of 02/12/16 (from the past 48 hour(s))  Comprehensive metabolic panel     Status: Abnormal   Collection Time: 02/12/16  4:38 PM  Result Value Ref Range   Sodium 141 135 - 145 mmol/L   Potassium 3.7 3.5 - 5.1 mmol/L   Chloride 107 101 - 111 mmol/L   CO2 26 22 - 32 mmol/L   Glucose, Bld 97 65 - 99 mg/dL   BUN 28 (H) 6 - 20 mg/dL   Creatinine, Ser 0.96 0.61 - 1.24 mg/dL   Calcium 8.6 (L) 8.9 - 10.3 mg/dL   Total Protein 6.0 (L) 6.5 - 8.1 g/dL   Albumin 3.7 3.5 - 5.0 g/dL   AST 13 (L) 15 - 41 U/L   ALT 13 (L) 17 - 63 U/L   Alkaline Phosphatase 44 38 - 126 U/L   Total Bilirubin 0.9 0.3 - 1.2 mg/dL   GFR calc non Af Amer >60 >60 mL/min   GFR calc Af Amer >60 >60 mL/min    Comment: (NOTE) The eGFR has been calculated using the CKD EPI equation. This calculation has not been validated in all clinical situations. eGFR's persistently <60 mL/min signify possible Chronic Kidney Disease.    Anion gap 8 5 - 15  CBC with  Differential/Platelet     Status: Abnormal   Collection Time: 02/12/16  4:38 PM  Result Value Ref Range   WBC 10.3 4.0 - 10.5 K/uL   RBC 3.99 (L) 4.22 - 5.81 MIL/uL   Hemoglobin 12.3 (L) 13.0 - 17.0 g/dL   HCT 36.7 (L) 39.0 - 52.0 %   MCV 92.0 78.0 - 100.0 fL   MCH 30.8 26.0 - 34.0 pg   MCHC 33.5 30.0 - 36.0 g/dL   RDW 13.3 11.5 - 15.5 %   Platelets 159 150 - 400 K/uL   Neutrophils Relative % 79 %   Neutro Abs 8.1 (H) 1.7 - 7.7 K/uL  Lymphocytes Relative 13 %   Lymphs Abs 1.4 0.7 - 4.0 K/uL   Monocytes Relative 7 %   Monocytes Absolute 0.8 0.1 - 1.0 K/uL   Eosinophils Relative 1 %   Eosinophils Absolute 0.1 0.0 - 0.7 K/uL   Basophils Relative 0 %   Basophils Absolute 0.0 0.0 - 0.1 K/uL  Protime-INR     Status: Abnormal   Collection Time: 02/12/16  4:38 PM  Result Value Ref Range   Prothrombin Time 16.5 (H) 11.6 - 15.2 seconds   INR 1.32 0.00 - 1.49  I-stat troponin, ED     Status: None   Collection Time: 02/12/16  4:54 PM  Result Value Ref Range   Troponin i, poc 0.02 0.00 - 0.08 ng/mL   Comment 3            Comment: Due to the release kinetics of cTnI, a negative result within the first hours of the onset of symptoms does not rule out myocardial infarction with certainty. If myocardial infarction is still suspected, repeat the test at appropriate intervals.   Ammonia     Status: Abnormal   Collection Time: 02/12/16  4:55 PM  Result Value Ref Range   Ammonia <9 (L) 9 - 35 umol/L  Type and screen [place]     Status: None   Collection Time: 02/12/16  6:00 PM  Result Value Ref Range   ABO/RH(D) O POS    Antibody Screen NEG    Sample Expiration 02/15/2016   ABO/Rh     Status: None   Collection Time: 02/12/16  6:00 PM  Result Value Ref Range   ABO/RH(D) O POS   Urinalysis, Routine w reflex microscopic (not at [facility])     Status: None   Collection Time: 02/12/16  6:03 PM  Result Value Ref Range   Color, Urine YELLOW YELLOW   APPearance CLEAR  CLEAR   Specific Gravity, Urine 1.027 1.005 - 1.030   pH 6.0 5.0 - 8.0   Glucose, UA NEGATIVE NEGATIVE mg/dL   Hgb urine dipstick NEGATIVE NEGATIVE   Bilirubin Urine NEGATIVE NEGATIVE   Ketones, ur NEGATIVE NEGATIVE mg/dL   Protein, ur NEGATIVE NEGATIVE mg/dL   Nitrite NEGATIVE NEGATIVE   Leukocytes, UA NEGATIVE NEGATIVE    Comment: MICROSCOPIC NOT DONE ON URINES WITH NEGATIVE PROTEIN, BLOOD, LEUKOCYTES, NITRITE, OR GLUCOSE <1000 mg/dL.  Hemoglobin     Status: None   Collection Time: 02/12/16  9:33 PM  Result Value Ref Range   Hemoglobin 13.0 13.0 - 17.0 g/dL  Hematocrit     Status: Abnormal   Collection Time: 02/12/16  9:33 PM  Result Value Ref Range   HCT 38.7 (L) 39.0 - 77.8 %  Basic metabolic panel     Status: Abnormal   Collection Time: 02/13/16  5:01 AM  Result Value Ref Range   Sodium 142 135 - 145 mmol/L   Potassium 3.3 (L) 3.5 - 5.1 mmol/L   Chloride 109 101 - 111 mmol/L   CO2 27 22 - 32 mmol/L   Glucose, Bld 97 65 - 99 mg/dL   BUN 22 (H) 6 - 20 mg/dL   Creatinine, Ser 1.08 0.61 - 1.24 mg/dL   Calcium 8.5 (L) 8.9 - 10.3 mg/dL   GFR calc non Af Amer >60 >60 mL/min   GFR calc Af Amer >60 >60 mL/min    Comment: (NOTE) The eGFR has been calculated using the CKD EPI equation. This calculation has not been validated in all  clinical situations. eGFR's persistently <60 mL/min signify possible Chronic Kidney Disease.    Anion gap 6 5 - 15  CBC     Status: Abnormal   Collection Time: 02/13/16  5:01 AM  Result Value Ref Range   WBC 7.4 4.0 - 10.5 K/uL   RBC 3.72 (L) 4.22 - 5.81 MIL/uL   Hemoglobin 11.6 (L) 13.0 - 17.0 g/dL   HCT 34.0 (L) 39.0 - 52.0 %   MCV 91.4 78.0 - 100.0 fL   MCH 31.2 26.0 - 34.0 pg   MCHC 34.1 30.0 - 36.0 g/dL   RDW 13.5 11.5 - 15.5 %   Platelets 146 (L) 150 - 400 K/uL   Dg Chest 2 View  02/12/2016  CLINICAL DATA:  Mental status changes. EXAM: CHEST  2 VIEW COMPARISON:  11/02/2010 FINDINGS: The cardiac silhouette, mediastinal and hilar contours  are within normal limits and stable. There is a large and enlarging hiatal hernia. Part of the colon appears to be in the hernia also. Streaky bibasilar atelectasis but no infiltrates, edema or effusions. The bony thorax is intact. IMPRESSION: Large hiatal hernia, increased in size since prior chest x-ray and appears to contain part of the transverse colon. Streaky bibasilar atelectasis but no infiltrates, edema or effusions. Electronically Signed   By: Marijo Sanes M.D.   On: 02/12/2016 17:29   Ct Head Wo Contrast  02/12/2016  CLINICAL DATA:  Transient memory loss and confusion noticed today. EXAM: CT HEAD WITHOUT CONTRAST TECHNIQUE: Contiguous axial images were obtained from the base of the skull through the vertex without intravenous contrast. COMPARISON:  None. FINDINGS: Brain: There is mild generalized brain atrophy with commensurate dilatation of the ventricles and sulci. Mild chronic small vessel ischemic change noted within the deep periventricular white matter regions. There is no mass, hemorrhage, edema or other evidence of acute parenchymal abnormality. No extra-axial hemorrhage. Vascular: No hyperdense vessel or unexpected calcification. There are chronic calcified atherosclerotic changes of the large vessels at the skull base. Skull: Negative for fracture or focal lesion. Sinuses/Orbits: Visualized upper paranasal sinuses are clear. Mastoid air cells are clear. Other: None. IMPRESSION: 1. No acute findings.  No intracranial mass, hemorrhage, or edema. 2. Mild atrophy and chronic small vessel ischemic changes in the deep white matter. Electronically Signed   By: Franki Cabot M.D.   On: 02/12/2016 17:00    Assessment: Single episode of vomiting with some suspicion of GI bleeding, no definite objective evidence of ongoing bleeding Plan:  Monitor stools and hemoglobin Advance diet Hold Eliquis for now Continue PPI Markea Ruzich C 02/13/2016, 8:50 AM  Pager [phone] If no answer or after 5  PM call [phone]

## 2016-02-13 NOTE — Progress Notes (Signed)
Patient had a large, soft, formed stool that was very dark in appearance. Occult sample sent to lab. GI MD also made aware of daughter's concern that pt has been vomiting for 15 months now- not with blood seen each time.

## 2016-02-13 NOTE — Progress Notes (Addendum)
Nephew states "we came to [facility] b/c we like it better than the hospital that is closer to our home.  [facility] is a 30 minute drive and the other hospital is a 20 minute drive - so it is worth it to [place] to drive a little extra"

## 2016-02-14 ENCOUNTER — Encounter (HOSPITAL_COMMUNITY): Payer: Self-pay | Admitting: *Deleted

## 2016-02-14 ENCOUNTER — Encounter (HOSPITAL_COMMUNITY)
Admission: EM | Disposition: A | Discharge status: Home / Self Care | Payer: Self-pay | Source: Home / Self Care | Attending: Internal Medicine

## 2016-02-14 DIAGNOSIS — K2211 Ulcer of esophagus with bleeding: Secondary | ICD-10-CM | POA: Diagnosis not present

## 2016-02-14 HISTORY — PX: ESOPHAGOGASTRODUODENOSCOPY: SHX5428

## 2016-02-14 LAB — CBC
HCT: 35.4 % — ABNORMAL LOW (ref 39.0–52.0)
HEMATOCRIT: 35.5 % — AB (ref 39.0–52.0)
Hemoglobin: 12.1 g/dL — ABNORMAL LOW (ref 13.0–17.0)
Hemoglobin: 12 g/dL — ABNORMAL LOW (ref 13.0–17.0)
MCH: 31.8 pg (ref 26.0–34.0)
MCH: 31.6 pg (ref 26.0–34.0)
MCHC: 34.1 g/dL (ref 30.0–36.0)
MCHC: 33.9 g/dL (ref 30.0–36.0)
MCV: 93.2 fL (ref 78.0–100.0)
MCV: 93.2 fL (ref 78.0–100.0)
PLATELETS: 155 10*3/uL (ref 150–400)
Platelets: 145 10*3/uL — ABNORMAL LOW (ref 150–400)
RBC: 3.81 MIL/uL — AB (ref 4.22–5.81)
RBC: 3.8 MIL/uL — ABNORMAL LOW (ref 4.22–5.81)
RDW: 13.7 % (ref 11.5–15.5)
RDW: 13.6 % (ref 11.5–15.5)
WBC: 6.9 10*3/uL (ref 4.0–10.5)
WBC: 6 10*3/uL (ref 4.0–10.5)

## 2016-02-14 SURGERY — EGD (ESOPHAGOGASTRODUODENOSCOPY)
Anesthesia: Moderate Sedation | Laterality: Left

## 2016-02-14 MED ORDER — EPINEPHRINE HCL 0.1 MG/ML IJ SOSY
PREFILLED_SYRINGE | INTRAMUSCULAR | Status: AC
  Filled 2016-02-14: qty 10

## 2016-02-14 MED ORDER — DIPHENHYDRAMINE HCL 50 MG/ML IJ SOLN
INTRAMUSCULAR | Status: AC
  Filled 2016-02-14: qty 1

## 2016-02-14 MED ORDER — MIDAZOLAM HCL 5 MG/ML IJ SOLN
INTRAMUSCULAR | Status: AC
  Filled 2016-02-14: qty 2

## 2016-02-14 MED ORDER — MIDAZOLAM HCL 10 MG/2ML IJ SOLN
INTRAMUSCULAR | Status: DC | PRN
  Administered 2016-02-14 (×2): 1 mg via INTRAVENOUS

## 2016-02-14 MED ORDER — FENTANYL CITRATE (PF) 100 MCG/2ML IJ SOLN
INTRAMUSCULAR | Status: DC | PRN
  Administered 2016-02-14: 25 ug via INTRAVENOUS

## 2016-02-14 MED ORDER — BUTAMBEN-TETRACAINE-BENZOCAINE 2-2-14 % EX AERO
INHALATION_SPRAY | CUTANEOUS | Status: DC | PRN
  Administered 2016-02-14: 2 via TOPICAL

## 2016-02-14 MED ORDER — FENTANYL CITRATE (PF) 100 MCG/2ML IJ SOLN
INTRAMUSCULAR | Status: AC
  Filled 2016-02-14: qty 2

## 2016-02-14 NOTE — Progress Notes (Signed)
[facility] Progress Note  Subjective: Place, had a fairly large dark stool documented by nurses yesterday. Tolerated diet  Objective: Vital signs in last 24 hours: Temp:  [97.3 F (36.3 C)-97.9 F (36.6 C)] 97.8 F (36.6 C) (04/04 0616) Pulse Rate:  [64-91] 74 (04/04 0616) Resp:  [16-18] 16 (04/04 0616) BP: (90-121)/(59-79) 90/59 mmHg (04/04 0616) SpO2:  [95 %-100 %] 97 % (04/04 0616) Weight change:    PE: Unchanged  Lab Results: Results for orders placed or performed during the hospital encounter of 02/12/16 (from the past 24 hour(s))  Occult blood card to lab, stool RN will collect     Status: Abnormal   Collection Time: 02/13/16 10:44 AM  Result Value Ref Range   Fecal Occult Bld POSITIVE (A) NEGATIVE  CBC     Status: Abnormal   Collection Time: 02/13/16  5:51 PM  Result Value Ref Range   WBC 8.0 4.0 - 10.5 K/uL   RBC 3.89 (L) 4.22 - 5.81 MIL/uL   Hemoglobin 12.3 (L) 13.0 - 17.0 g/dL   HCT 36.3 (L) 39.0 - 52.0 %   MCV 93.3 78.0 - 100.0 fL   MCH 31.6 26.0 - 34.0 pg   MCHC 33.9 30.0 - 36.0 g/dL   RDW 13.8 11.5 - 15.5 %   Platelets 162 150 - 400 K/uL  CBC     Status: Abnormal   Collection Time: 02/14/16  5:14 AM  Result Value Ref Range   WBC 6.9 4.0 - 10.5 K/uL   RBC 3.81 (L) 4.22 - 5.81 MIL/uL   Hemoglobin 12.1 (L) 13.0 - 17.0 g/dL   HCT 35.5 (L) 39.0 - 52.0 %   MCV 93.2 78.0 - 100.0 fL   MCH 31.8 26.0 - 34.0 pg   MCHC 34.1 30.0 - 36.0 g/dL   RDW 13.7 11.5 - 15.5 %   Platelets 155 150 - 400 K/uL    Studies/Results: Dg Chest 2 View  02/12/2016  CLINICAL DATA:  Mental status changes. EXAM: CHEST  2 VIEW COMPARISON:  11/02/2010 FINDINGS: The cardiac silhouette, mediastinal and hilar contours are within normal limits and stable. There is a large and enlarging hiatal hernia. Part of the colon appears to be in the hernia also. Streaky bibasilar atelectasis but no infiltrates, edema or effusions. The bony thorax is intact. IMPRESSION: Large hiatal hernia,  increased in size since prior chest x-ray and appears to contain part of the transverse colon. Streaky bibasilar atelectasis but no infiltrates, edema or effusions. Electronically Signed   By: Marijo Sanes M.D.   On: 02/12/2016 17:29   Ct Head Wo Contrast  02/12/2016  CLINICAL DATA:  Transient memory loss and confusion noticed today. EXAM: CT HEAD WITHOUT CONTRAST TECHNIQUE: Contiguous axial images were obtained from the base of the skull through the vertex without intravenous contrast. COMPARISON:  None. FINDINGS: Brain: There is mild generalized brain atrophy with commensurate dilatation of the ventricles and sulci. Mild chronic small vessel ischemic change noted within the deep periventricular white matter regions. There is no mass, hemorrhage, edema or other evidence of acute parenchymal abnormality. No extra-axial hemorrhage. Vascular: No hyperdense vessel or unexpected calcification. There are chronic calcified atherosclerotic changes of the large vessels at the skull base. Skull: Negative for fracture or focal lesion. Sinuses/Orbits: Visualized upper paranasal sinuses are clear. Mastoid air cells are clear. Other: None. IMPRESSION: 1. No acute findings.  No intracranial mass, hemorrhage, or edema. 2. Mild atrophy and chronic small vessel ischemic changes in the deep white matter.  Electronically Signed   By: Franki Cabot M.D.   On: 02/12/2016 17:00      Assessment: Coffee-ground emesis in patient on Eliquis with known large hiatal hernia, hemodynamically stable apparent intermittent antecedent vomiting sometimes induced by the patient.  Plan: Continue PPI Hold Eliquis EGD today.    Chrystel Barefield C 02/14/2016, 9:27 AM  Pager [phone] If no answer or after 5 PM call [phone]

## 2016-02-14 NOTE — Progress Notes (Signed)
Initial Nutrition Assessment  DOCUMENTATION CODES:   Non-severe (moderate) malnutrition in context of chronic illness  INTERVENTION:  - Monitor diet advancement. - Once diet advanced, order Ensure Enlive BID. Each supplement will provide 350 kcals and 20 grams of protein. - Continue to monitor nutritional needs.  NUTRITION DIAGNOSIS:   Inadequate oral intake related to nausea, vomiting as evidenced by per patient/family report.  GOAL:   Patient will meet greater than or equal to 90% of their needs  MONITOR:   PO intake, Supplement acceptance, Diet advancement, Weight trends, Labs, Skin, I & O's  REASON FOR ASSESSMENT:   Malnutrition Screening Tool    ASSESSMENT:   82 y.o. white male after developing abdominal's distention after which he became nauseated and actually states that he induced vomiting. This was apparently described as dark. He has not had a bowel movement in 2 days and felt better after vomiting. He is hungry and denies any abdominal pain. He had an EGD by Dr. Oletta Lamas 3 years ago which showed a large hiatal hernia and J-shaped stomach. He is on Eliquis and also on omeprazole.  Pt seen for MST. Pt BMI categorized as healthy weight. Per chart, pt weight has been stable for the past year, fluctuating between 147-154 lbs since 10/11/2014. Pt currently NPO but has eaten 100% of meals provided while on heart healthy diet.   Pt's daughter reports pt intake of omelette, fruit, candy bar, pasta with meat sauce, and salad during hospital stay. Pt reports no abdominal pain or N/V associated with this intake. PTA, pt reports eating a light breakfast, moderate size lunch, and heavy dinner. Pt did not provide dietary recall but daughter reports intake of hot dogs, sandwiches, and fries. Pt reports good appetite up until 3 days PTA. At this point, pt began experiencing abdominal discomfort and induced vomiting. No more vomiting is reported during hospital stay. Pt reports no use of  nutritional supplements at home. Pt's daughter expressed interest in ordering Ensure once pt's diet is advanced because she thinks he is not eating well. Ensure will provide protein, vitamin, and minerals that the pt may not be getting on his current diet at home.   NFPE: Mild muscle depletion, mild fat depletion, no edema.   Labs reviewed; 3.3 mmol/L, 22 mg/dl, Ca 8.5 mg/dl  Meds reviewed;   Diet Order:  Diet NPO time specified  Skin:  Reviewed, no issues  Last BM:  4/3  Height:   Ht Readings from Last 1 Encounters:  02/14/16 5\' 7"  (1.702 m)    Weight:   Wt Readings from Last 1 Encounters:  02/14/16 147 lb (66.679 kg)    Ideal Body Weight:  67 kg  BMI:  Body mass index is 23.02 kg/(m^2).  Estimated Nutritional Needs:   Kcal:  1500-1700 kcals (22-25 kcal/kg)  Protein:  70-80 g/kg (1.1-1.2 g/kg)  Fluid:  1.7-1.9 L  EDUCATION NEEDS:   No education needs identified at this time   Geoffery Lyons, [place] Dietetic Intern Pager [phone]

## 2016-02-14 NOTE — Op Note (Signed)
[facility] Patient Name: Henry Ayala Procedure Date: 02/14/2016 MRN: [id number] Attending MD: Wonda Horner , MD Date of Birth: 08/17/1931 CSN:  Age: 82 Admit Type: Inpatient Procedure:                Upper GI endoscopy Indications:              Coffee-ground emesis Providers:                Wonda Horner, MD, Kingsley Plan, RN, Elspeth Cho, Technician Referring MD:              Medicines:                Fentanyl 25 micrograms IV, Midazolam 2 mg IV Complications:            No immediate complications. Estimated Blood Loss:     Estimated blood loss: none. Procedure:                Pre-Anesthesia Assessment:                           - Prior to the procedure, a History and Physical                            was performed, and patient medications and                            allergies were reviewed. The patient's tolerance of                            previous anesthesia was also reviewed. The risks                            and benefits of the procedure and the sedation                            options and risks were discussed with the patient.                            All questions were answered, and informed consent                            was obtained. Prior Anticoagulants: The patient has                            taken Eliquis (apixaban), last dose was 1 day prior                            to procedure. ASA Grade Assessment: III - A patient                            with severe systemic disease. After reviewing the  risks and benefits, the patient was deemed in                            satisfactory condition to undergo the procedure.                           After obtaining informed consent, the endoscope was                            passed under direct vision. Throughout the                            procedure, the patient's blood pressure, pulse, and   oxygen saturations were monitored continuously. The                            [id number] scope was introduced through the                            mouth, and advanced to the second part of duodenum.                            The upper GI endoscopy was accomplished without                            difficulty. The patient tolerated the procedure                            well. Scope In: Scope Out: Findings:      Esophagitis was found. It was ulcerative and located in the distal       esophagus. Friable.      A medium-sized hiatal hernia was present. Both a sliding and a para       esophageal.      The examined duodenum was normal. Impression:               - Reflux esophagitis.                           - Medium-sized hiatal hernia.                           - Normal examined duodenum.                           - No specimens collected. Moderate Sedation:      Moderate (conscious) sedation was administered by the endoscopy nurse       and supervised by the endoscopist. The following parameters were       monitored: oxygen saturation, heart rate, blood pressure, respiratory       rate, EKG, adequacy of pulmonary ventilation, and response to care. Recommendation:           - Resume regular diet.                           - Continue present medications.                           -  He should be on chronic PPI therapy.                           - Resume Eliquis (apixaban) at prior dose in 5                            days, provided it is safe from a cardiac standpoint. Procedure Code(s):        --- Professional ---                           [phone], Esophagogastroduodenoscopy, flexible,                            transoral; diagnostic, including collection of                            specimen(s) by brushing or washing, when performed                            (separate procedure) Diagnosis Code(s):        --- Professional ---                           K21.0,  Gastro-esophageal reflux disease with                            esophagitis                           K44.9, Diaphragmatic hernia without obstruction or                            gangrene                           K92.0, Hematemesis CPT copyright 2016 [employer]. All rights reserved. The codes documented in this report are preliminary and upon coder review may  be revised to meet current compliance requirements. Anson Fret, MD Wonda Horner, MD 02/14/2016 3:15:35 PM This report has been signed electronically. Number of Addenda: 0

## 2016-02-14 NOTE — Progress Notes (Signed)
[facility] PROGRESS NOTE    Progress Note   Henry Ayala [id number] DOB: [birth year] DOA: 02/12/2016 PCP: Warren Danes, MD   Brief Narrative:   Henry Ayala is an 82 y.o. male past medical history of ischemic heart disease, chronic atrial fibrillation on Eluquis and chronic systolic heart failure that comes in for hematemesis that started the day prior to admission.  Assessment/Plan:   Upper GI bleed: GI was consulted, Patient had a fairly.stool documented by the nurse There is no ongoing vomiting so they  - Monitor CBCs, nothing by mouth for EGD on 02/14/2016. Continue PPI. His hemoglobin back in 12/16/2015 was 13 on admission it was 12, after IV hydration is now 11.6.   Ischemic heart disease/chronic systolic heart failure with an EF of 40%: Blood pressure seems to be stable, continue metoprolol and lisinopril. She is not on a diuretic  Hypercholesterolemia: Continue statins.  Chornic  Atrial fibrillation [facility]): Rate controlled with metoprolol, continue to hold Eluquis.  Benign essential HTN Blood pressure seems to be well controlled continue current regimen. He was initially high on admission but now is ranging from 102/47 to 112/86.  DVT Prophylaxis Place SCDs.   Family Communication: none Disposition Plan: Unable to determined.. Code Status:     Code Status Orders        Start     Ordered   02/12/16 2112  Full code   Continuous     02/12/16 2111    Code Status History    Date Active Date Inactive Code Status Order ID Comments User Context   This patient has a current code status but no historical code status.        IV Access:    Peripheral IV   Procedures and diagnostic studies:   Dg Chest 2 View  02/12/2016  CLINICAL DATA:  Mental status changes. EXAM: CHEST  2 VIEW COMPARISON:  11/02/2010 FINDINGS: The cardiac silhouette, mediastinal and hilar contours are within normal limits and stable. There is a large and enlarging  hiatal hernia. Part of the colon appears to be in the hernia also. Streaky bibasilar atelectasis but no infiltrates, edema or effusions. The bony thorax is intact. IMPRESSION: Large hiatal hernia, increased in size since prior chest x-ray and appears to contain part of the transverse colon. Streaky bibasilar atelectasis but no infiltrates, edema or effusions. Electronically Signed   By: Marijo Sanes M.D.   On: 02/12/2016 17:29   Ct Head Wo Contrast  02/12/2016  CLINICAL DATA:  Transient memory loss and confusion noticed today. EXAM: CT HEAD WITHOUT CONTRAST TECHNIQUE: Contiguous axial images were obtained from the base of the skull through the vertex without intravenous contrast. COMPARISON:  None. FINDINGS: Brain: There is mild generalized brain atrophy with commensurate dilatation of the ventricles and sulci. Mild chronic small vessel ischemic change noted within the deep periventricular white matter regions. There is no mass, hemorrhage, edema or other evidence of acute parenchymal abnormality. No extra-axial hemorrhage. Vascular: No hyperdense vessel or unexpected calcification. There are chronic calcified atherosclerotic changes of the large vessels at the skull base. Skull: Negative for fracture or focal lesion. Sinuses/Orbits: Visualized upper paranasal sinuses are clear. Mastoid air cells are clear. Other: None. IMPRESSION: 1. No acute findings.  No intracranial mass, hemorrhage, or edema. 2. Mild atrophy and chronic small vessel ischemic changes in the deep white matter. Electronically Signed   By: Franki Cabot M.D.   On: 02/12/2016 17:00     Medical Consultants:  None.  Anti-Infectives:   Anti-infectives    None      Subjective:    L[place] Ham Stukey dark bowel movement yesterday.  Objective:    Filed Vitals:   02/13/16 2234 02/14/16 0200 02/14/16 0616 02/14/16 1014  BP: 121/78 100/79 90/59 102/60  Pulse: 64 91 74 72  Temp: 97.5 F (36.4 C) 97.9 F (36.6 C) 97.8 F (36.6  C) 97.8 F (36.6 C)  TempSrc:  Oral Oral Oral  Resp:  16 16 18   Height:      Weight:      SpO2: 99% 100% 97% 97%    Intake/Output Summary (Last 24 hours) at 02/14/16 1215 Last data filed at 02/14/16 0839  Gross per 24 hour  Intake 1638.33 ml  Output   1250 ml  Net 388.33 ml   Filed Weights   02/12/16 2100  Weight: 66.996 kg (147 lb 11.2 oz)    Exam: Gen:  NAD Cardiovascular:  RRR. Chest and lungs:   Good air movement clear to auscultation. Abdomen:  Abdomen soft, NT/ND, + BS Extremities:  No edema  Data Reviewed:    Labs: Basic Metabolic Panel:  Recent Labs Lab 02/12/16 1638 02/13/16 0501  NA 141 142  K 3.7 3.3*  CL 107 109  CO2 26 27  GLUCOSE 97 97  BUN 28* 22*  CREATININE 0.96 1.08  CALCIUM 8.6* 8.5*   GFR Estimated Creatinine Clearance: 49.3 mL/min (by C-G formula based on Cr of 1.08). Liver Function Tests:  Recent Labs Lab 02/12/16 1638  AST 13*  ALT 13*  ALKPHOS 44  BILITOT 0.9  PROT 6.0*  ALBUMIN 3.7   No results for input(s): LIPASE, AMYLASE in the last 168 hours.  Recent Labs Lab 02/12/16 1655  AMMONIA <9*   Coagulation profile  Recent Labs Lab 02/12/16 1638  INR 1.32    CBC:  Recent Labs Lab 02/12/16 1638 02/12/16 2133 02/13/16 0501 02/13/16 1751 02/14/16 0514  WBC 10.3  --  7.4 8.0 6.9  NEUTROABS 8.1*  --   --   --   --   HGB 12.3* 13.0 11.6* 12.3* 12.1*  HCT 36.7* 38.7* 34.0* 36.3* 35.5*  MCV 92.0  --  91.4 93.3 93.2  PLT 159  --  146* 162 155   Cardiac Enzymes: No results for input(s): CKTOTAL, CKMB, CKMBINDEX, TROPONINI in the last 168 hours. BNP (last 3 results) No results for input(s): PROBNP in the last 8760 hours. CBG: No results for input(s): GLUCAP in the last 168 hours. D-Dimer: No results for input(s): DDIMER in the last 72 hours. Hgb A1c: No results for input(s): HGBA1C in the last 72 hours. Lipid Profile: No results for input(s): CHOL, HDL, LDLCALC, TRIG, CHOLHDL, LDLDIRECT in the last 72  hours. Thyroid function studies: No results for input(s): TSH, T4TOTAL, T3FREE, THYROIDAB in the last 72 hours.  Invalid input(s): FREET3 Anemia work up: No results for input(s): VITAMINB12, FOLATE, FERRITIN, TIBC, IRON, RETICCTPCT in the last 72 hours. Sepsis Labs:  Recent Labs Lab 02/12/16 1638 02/13/16 0501 02/13/16 1751 02/14/16 0514  WBC 10.3 7.4 8.0 6.9   Microbiology No results found for this or any previous visit (from the past 240 hour(s)).   Medications:   . doxazosin  4 mg Oral Daily  . lisinopril  10 mg Oral Daily  . metoprolol tartrate  25 mg Oral BID  . pantoprazole (PROTONIX) IV  40 mg Intravenous Q12H  . pravastatin  40 mg Oral q1800  . sodium chloride flush  3 mL Intravenous Q12H   Continuous Infusions: . sodium chloride 50 mL/hr at 02/13/16 2238    Time spent: 25 min   LOS: 2 days   Charlynne Cousins  [facility] Pager [phone]  *Please refer to [email or web address], password [email or web address] to get updated schedule on who will round on this patient, as hospitalists switch teams weekly. If 7PM-7AM, please contact night-coverage at [email or web address], password [email or web address] for any overnight needs.  02/14/2016, 12:15 PM

## 2016-02-15 ENCOUNTER — Encounter (HOSPITAL_COMMUNITY): Payer: Self-pay | Admitting: Gastroenterology

## 2016-02-15 LAB — CBC
HCT: 33.8 % — ABNORMAL LOW (ref 39.0–52.0)
HEMOGLOBIN: 11.4 g/dL — AB (ref 13.0–17.0)
MCH: 31.6 pg (ref 26.0–34.0)
MCHC: 33.7 g/dL (ref 30.0–36.0)
MCV: 93.6 fL (ref 78.0–100.0)
Platelets: 142 10*3/uL — ABNORMAL LOW (ref 150–400)
RBC: 3.61 MIL/uL — AB (ref 4.22–5.81)
RDW: 13.6 % (ref 11.5–15.5)
WBC: 6.1 10*3/uL (ref 4.0–10.5)

## 2016-02-15 MED ORDER — METOPROLOL TARTRATE 25 MG PO TABS: 12.5000 mg | ORAL_TABLET | Freq: Two times a day (BID) | ORAL | Status: DC

## 2016-02-15 MED ORDER — PANTOPRAZOLE SODIUM 40 MG PO TBEC: 40.0000 mg | DELAYED_RELEASE_TABLET | Freq: Two times a day (BID) | ORAL | Status: DC

## 2016-02-15 MED ORDER — PANTOPRAZOLE SODIUM 40 MG PO TBEC
40.0000 mg | DELAYED_RELEASE_TABLET | Freq: Two times a day (BID) | ORAL | Status: DC
  Administered 2016-02-15: 40 mg via ORAL
  Filled 2016-02-15: qty 1

## 2016-02-15 MED ORDER — APIXABAN 5 MG PO TABS: 5.0000 mg | ORAL_TABLET | Freq: Two times a day (BID) | ORAL | Status: DC

## 2016-02-15 NOTE — Care Management Important Message (Signed)
Important Message  Patient Details  Name: Henry Ayala MRN: [id number] Date of Birth: August 07, 1931   Medicare Important Message Given:  Yes    Camillo Flaming 02/15/2016, 9:05 AMImportant Message  Patient Details  Name: Henry Ayala MRN: [id number] Date of Birth: [birth year]   Medicare Important Message Given:  Yes    Camillo Flaming 02/15/2016, 9:05 AM

## 2016-02-15 NOTE — Care Management Note (Signed)
Case Management Note  Patient Details  Name: Henry Ayala MRN: [id number] Date of Birth: [birth year]  Subjective/Objective:                    Action/Plan:d/c home no needs or orders.   Expected Discharge Date:                  Expected Discharge Plan:  Home/Self Care  In-House Referral:     Discharge planning Services  CM Consult  Post Acute Care Choice:    Choice offered to:     DME Arranged:    DME Agency:     HH Arranged:    [place] Agency:     Status of Service:  Completed, signed off  Medicare Important Message Given:  Yes Date Medicare IM Given:    Medicare IM give by:    Date Additional Medicare IM Given:    Additional Medicare Important Message give by:     If discussed at [place] of Stay Meetings, dates discussed:    Additional Comments:  Dessa Phi, RN 02/15/2016, 9:31 AM

## 2016-02-15 NOTE — Discharge Summary (Signed)
Physician Discharge Summary  Henry Ayala [id number] DOB: [birth year] DOA: 02/12/2016  PCP: Warren Danes, MD  Admit date: 02/12/2016 Discharge date: 02/15/2016  Time spent: 35 minutes  Recommendations for Outpatient Follow-up:  1. Follow-up with GI as an outpatient in 2 weeks to follow-up on biopsy results. 2. We'll have to follow with his PCP as an outpatient will evaluate him for dementia.   Discharge Diagnoses:  Principal Problem:   Upper GI bleed Active Problems:   Ischemic heart disease   Hypercholesterolemia   Chronic atrial fibrillation ([name])   Benign essential HTN   Anticoagulant long-term use   Bleeding gastrointestinal   Discharge Condition: stable  Diet recommendation: regular  Filed Weights   02/12/16 2100 02/14/16 1419  Weight: 66.996 kg (147 lb 11.2 oz) 66.679 kg (147 lb)    History of present illness:  82 y.o. male with a history of CAD, Atrial Fibrillation on Eliquis Rx, Systolic CHF, HTN, Hyperlipidemia, and BPH who was brought to the ED due to Hematemesis x 3-4 episodes yesterday. He denies any ABD Pain, but his daughter is at the bedside and reports that he has had stomach discomfort and indigestion symptoms and poor appetite for about 1 month. He denies any weakness or dizziness.  Hospital Course:  Acute upper GI bleed due to esophagitis: He was started on IV Protonix Place nothing by mouth his hemoglobin was monitored closely, as was stopped. He had a melanotic stool in the hospital, GI was consulted who recommended an endoscopy this oh-esophagitis and a medium-size hiatal hernia. Continue Protonix twice a day and will follow-up with GI as an outpatient. He is to restart Eliquis in a week.  Ischemic heart disease/chronic systolic heart failure with an EF of 40%: No changes were made to his medication.  Chronic atrial fibrillation: Rate control his Eliquis was held. He will be started in 1 week.  Benign essential hypertension: No  changes were made to his medication. Next    Procedures:  EGD 4.4.2017  Consultations:  GI  Discharge Exam: Filed Vitals:   02/15/16 0126 02/15/16 0512  BP: 98/59 98/44  Pulse: 53 53  Temp: 97.9 F (36.6 C) 98.3 F (36.8 C)  Resp: 20 20    General: A&O x3 Cardiovascular: RRR Respiratory: good air movement CTA B/L  Discharge Instructions   Discharge Instructions    Diet - low sodium heart healthy    Complete by:  As directed      Increase activity slowly    Complete by:  As directed           Current Discharge Medication List    START taking these medications   Details  pantoprazole (PROTONIX) 40 MG tablet Take 1 tablet (40 mg total) by mouth 2 (two) times daily. Qty: 30 tablet, Refills: 0      CONTINUE these medications which have CHANGED   Details  apixaban (ELIQUIS) 5 MG TABS tablet Take 1 tablet (5 mg total) by mouth 2 (two) times daily. Qty: 60 tablet, Refills: 10      CONTINUE these medications which have NOT CHANGED   Details  acetaminophen (TYLENOL) 500 MG tablet Take 500 mg by mouth every 6 (six) hours as needed for moderate pain or headache.    doxazosin (CARDURA) 4 MG tablet TAKE ONE TABLET BY MOUTH ONCE DAILY Qty: 90 tablet, Refills: 1    lisinopril (PRINIVIL,ZESTRIL) 10 MG tablet TAKE ONE TABLET BY MOUTH ONCE DAILY Qty: 90 tablet, Refills: 1    loperamide (IMODIUM  A-D) 2 MG tablet Take 2 mg by mouth 4 (four) times daily as needed for diarrhea or loose stools.    lovastatin (MEVACOR) 40 MG tablet TAKE ONE TABLET BY MOUTH ONCE DAILY Qty: 90 tablet, Refills: 1    metoprolol tartrate (LOPRESSOR) 25 MG tablet TAKE ONE-HALF TABLET BY MOUTH ONCE DAILY OR TAKE AS DIRECTED Qty: 90 tablet, Refills: 1      STOP taking these medications     esomeprazole (NEXIUM) 40 MG capsule        Allergies  Allergen Reactions  . Iodinated Diagnostic Agents     unknown  . Lipitor [Atorvastatin Calcium]     unknown  . Sulfa Drugs Cross Reactors      unknown   Follow-up Information    Follow up with Warren Danes, MD In 2 weeks.   Specialty:  Cardiology   Why:  hospital follow up       The results of significant diagnostics from this hospitalization (including imaging, microbiology, ancillary and laboratory) are listed below for reference.    Significant Diagnostic Studies: Dg Chest 2 View  02/12/2016  CLINICAL DATA:  Mental status changes. EXAM: CHEST  2 VIEW COMPARISON:  11/02/2010 FINDINGS: The cardiac silhouette, mediastinal and hilar contours are within normal limits and stable. There is a large and enlarging hiatal hernia. Part of the colon appears to be in the hernia also. Streaky bibasilar atelectasis but no infiltrates, edema or effusions. The bony thorax is intact. IMPRESSION: Large hiatal hernia, increased in size since prior chest x-ray and appears to contain part of the transverse colon. Streaky bibasilar atelectasis but no infiltrates, edema or effusions. Electronically Signed   By: Marijo Sanes M.D.   On: 02/12/2016 17:29   Ct Head Wo Contrast  02/12/2016  CLINICAL DATA:  Transient memory loss and confusion noticed today. EXAM: CT HEAD WITHOUT CONTRAST TECHNIQUE: Contiguous axial images were obtained from the base of the skull through the vertex without intravenous contrast. COMPARISON:  None. FINDINGS: Brain: There is mild generalized brain atrophy with commensurate dilatation of the ventricles and sulci. Mild chronic small vessel ischemic change noted within the deep periventricular white matter regions. There is no mass, hemorrhage, edema or other evidence of acute parenchymal abnormality. No extra-axial hemorrhage. Vascular: No hyperdense vessel or unexpected calcification. There are chronic calcified atherosclerotic changes of the large vessels at the skull base. Skull: Negative for fracture or focal lesion. Sinuses/Orbits: Visualized upper paranasal sinuses are clear. Mastoid air cells are clear. Other: None. IMPRESSION: 1.  No acute findings.  No intracranial mass, hemorrhage, or edema. 2. Mild atrophy and chronic small vessel ischemic changes in the deep white matter. Electronically Signed   By: Franki Cabot M.D.   On: 02/12/2016 17:00    Microbiology: No results found for this or any previous visit (from the past 240 hour(s)).   Labs: Basic Metabolic Panel:  Recent Labs Lab 02/12/16 1638 02/13/16 0501  NA 141 142  K 3.7 3.3*  CL 107 109  CO2 26 27  GLUCOSE 97 97  BUN 28* 22*  CREATININE 0.96 1.08  CALCIUM 8.6* 8.5*   Liver Function Tests:  Recent Labs Lab 02/12/16 1638  AST 13*  ALT 13*  ALKPHOS 44  BILITOT 0.9  PROT 6.0*  ALBUMIN 3.7   No results for input(s): LIPASE, AMYLASE in the last 168 hours.  Recent Labs Lab 02/12/16 1655  AMMONIA <9*   CBC:  Recent Labs Lab 02/12/16 1638  02/13/16 0501 02/13/16 1751 02/14/16  [id number] 02/14/16 1752 02/15/16 0639  WBC 10.3  --  7.4 8.0 6.9 6.0 6.1  NEUTROABS 8.1*  --   --   --   --   --   --   HGB 12.3*  < > 11.6* 12.3* 12.1* 12.0* 11.4*  HCT 36.7*  < > 34.0* 36.3* 35.5* 35.4* 33.8*  MCV 92.0  --  91.4 93.3 93.2 93.2 93.6  PLT 159  --  146* 162 155 145* 142*  < > = values in this interval not displayed. Cardiac Enzymes: No results for input(s): CKTOTAL, CKMB, CKMBINDEX, TROPONINI in the last 168 hours. BNP: BNP (last 3 results) No results for input(s): BNP in the last 8760 hours.  ProBNP (last 3 results) No results for input(s): PROBNP in the last 8760 hours.  CBG: No results for input(s): GLUCAP in the last 168 hours.    Signed:  Charlynne Cousins MD.  [facility] 02/15/2016, 8:23 AM

## 2016-02-15 NOTE — Progress Notes (Signed)
Reviewed discharge information with patient and caregiver. Answered all questions. Patient/caregiver able to teach back medications and reasons to contact MD/911. Patient verbalizes importance of PCP follow up appointment and F/U with GI doctor. Verbalizes understanding to resume BP medications tomorrow and eliquis on 4/11 [name] M. Brigitte Pulse, RN

## 2016-02-15 NOTE — Progress Notes (Signed)
Patient BP 85/48 , patient asytomatic and denies dizzyness/light headedness while ambulating. MD notified. Orders to hold BP meds today and continue with discharge.

## 2016-02-27 ENCOUNTER — Other Ambulatory Visit: Payer: Self-pay

## 2016-02-27 MED ORDER — PANTOPRAZOLE SODIUM 40 MG PO TBEC: 40.0000 mg | DELAYED_RELEASE_TABLET | Freq: Two times a day (BID) | ORAL | Status: DC

## 2016-02-27 NOTE — Telephone Encounter (Signed)
Okay to refill Protonix.

## 2016-02-27 NOTE — Telephone Encounter (Signed)
See message below regarding filling Protonix.

## 2016-02-29 DIAGNOSIS — K209 Esophagitis, unspecified: Secondary | ICD-10-CM | POA: Diagnosis not present

## 2016-02-29 DIAGNOSIS — K922 Gastrointestinal hemorrhage, unspecified: Secondary | ICD-10-CM | POA: Diagnosis not present

## 2016-03-02 ENCOUNTER — Encounter: Payer: Self-pay | Admitting: Cardiology

## 2016-03-02 ENCOUNTER — Ambulatory Visit (INDEPENDENT_AMBULATORY_CARE_PROVIDER_SITE_OTHER): Payer: Medicare Other | Admitting: Cardiology

## 2016-03-02 VITALS — BP 105/71 | HR 68 | Ht 67.0 in | Wt 152.8 lb

## 2016-03-02 DIAGNOSIS — I482 Chronic atrial fibrillation, unspecified: Secondary | ICD-10-CM

## 2016-03-02 DIAGNOSIS — Z8719 Personal history of other diseases of the digestive system: Secondary | ICD-10-CM

## 2016-03-02 DIAGNOSIS — I2510 Atherosclerotic heart disease of native coronary artery without angina pectoris: Secondary | ICD-10-CM

## 2016-03-02 DIAGNOSIS — I255 Ischemic cardiomyopathy: Secondary | ICD-10-CM

## 2016-03-02 DIAGNOSIS — E785 Hyperlipidemia, unspecified: Secondary | ICD-10-CM

## 2016-03-02 DIAGNOSIS — I259 Chronic ischemic heart disease, unspecified: Secondary | ICD-10-CM

## 2016-03-02 NOTE — Patient Instructions (Signed)
Your physician recommends that you continue on your current medications as directed. Please refer to the Current Medication list given to you today. Your physician recommends that you return for lab work in: 6 months just before your next visit to check your BMET and CBC. Your physician recommends that you schedule a follow-up appointment in: 6 months. You will receive a reminder letter in the mail in about 4 months reminding you to call and schedule your appointment. If you don't receive this letter, please contact our office.

## 2016-03-02 NOTE — Progress Notes (Signed)
Cardiology Office Note  Date: 03/02/2016   ID: EDOUARD KORNREICH, DOB September 10, 1931, MRN [id number]  PCP: None Evaluating Cardiologist: Rozann Lesches, MD   Chief Complaint  Patient presents with  . Coronary Artery Disease  . Atrial Fibrillation    History of Present Illness:  Henry Ayala is an 82 y.o. male former patient of Dr. Mare Ferrari, now establishing follow-up with me. This is our first meeting in the office. I reviewed extensive records and updated his chart. He was recently hospitalized earlier this month with upper GI bleed related to esophagitis. Eliquis was held temporarily, he has subsequently resumed.  He is here today with an Environmental consultant. My understanding is that he had GI follow-up with Dr. Amedeo Plenty, no additional testing is planned at this time. He is not reporting any further problems with bleeding, states that he is been compliant with Protonix.  He does not report any angina symptoms or palpitations. I reviewed his medications. Cardiac regimen includes Lopressor, Mevacor, lisinopril, and Eliquis.  He has been living in [place] for the last few months, moved from [place]. His wife passed away a few years ago. He does not have a primary care provider in this area as yet but his assistant indicates that she is helping him to look for one. We gave him a list of providers.  I reviewed his most recent echocardiogram and also his lab work from hospital stay.  It was evident to me on our first meeting today that he has trouble recalling certain memories, suspect that he has dementia to some degree.  Past Medical History  Diagnosis Date  . Hyperlipidemia   . Anteroseptal myocardial infarction [facility])     PTCA to LAD 1992  . Myocardial infarction, inferior wall ([name])     BMS to proximal RCA 2004 at [facility]  . GERD (gastroesophageal reflux disease)   . Sleep apnea     On CPAP  . Nephrolithiasis   . BPH (benign prostatic hyperplasia)   . Hiatal hernia   . Ischemic  cardiomyopathy     [name] 48% 2015  . Essential hypertension   . CAD (coronary artery disease)     PTCA to LAD 1992, BMS to RCA 2004  . Chronic atrial fibrillation ([place])   . GI bleed     March 2017 - esophagitis    Past Surgical History  Procedure Laterality Date  . Total knee arthroplasty      right  . Cardioversion N/A 06/30/2014    Procedure: CARDIOVERSION;  Surgeon: Sanda Klein, MD;  Location: [facility] ENDOSCOPY;  Service: Cardiovascular;  Laterality: N/A;  . Esophagogastroduodenoscopy Left 02/14/2016    Procedure: ESOPHAGOGASTRODUODENOSCOPY (EGD);  Surgeon: Wonda Horner, MD;  Location: [facility] ENDOSCOPY;  Service: Endoscopy;  Laterality: Left;    Current Outpatient Prescriptions  Medication Sig Dispense Refill  . acetaminophen (TYLENOL) 500 MG tablet Take 500 mg by mouth every 6 (six) hours as needed for moderate pain or headache.    [name] apixaban (ELIQUIS) 5 MG TABS tablet Take 1 tablet (5 mg total) by mouth 2 (two) times daily. 60 tablet 10  . doxazosin (CARDURA) 4 MG tablet TAKE ONE TABLET BY MOUTH ONCE DAILY 90 tablet 1  . lisinopril (PRINIVIL,ZESTRIL) 10 MG tablet TAKE ONE TABLET BY MOUTH ONCE DAILY 90 tablet 1  . loperamide (IMODIUM A-D) 2 MG tablet Take 2 mg by mouth 4 (four) times daily as needed for diarrhea or loose stools.    . lovastatin (MEVACOR) 40 MG tablet TAKE  ONE TABLET BY MOUTH ONCE DAILY 90 tablet 1  . metoprolol tartrate (LOPRESSOR) 25 MG tablet TAKE ONE-HALF TABLET BY MOUTH ONCE DAILY OR TAKE AS DIRECTED 90 tablet 1  . pantoprazole (PROTONIX) 40 MG tablet Take 1 tablet (40 mg total) by mouth 2 (two) times daily. 30 tablet 6   No current facility-administered medications for this visit.   Allergies:  Iodinated diagnostic agents; Lipitor; and Sulfa drugs cross reactors   Social History: The patient  reports that he has never smoked. He has never used smokeless tobacco. He reports that he does not drink alcohol or use illicit drugs.   Family History: The patient's family  history includes Colon cancer in his sister; Coronary artery disease in his brother; Heart attack in his brother and father; Stroke in his mother.   ROS:  Please see the history of present illness. Otherwise, complete review of systems is positive for decreased hearing, memory loss.  All other systems are reviewed and negative.   Physical Exam: VS:  BP 105/71 mmHg  Pulse 68  Ht 5\' 7"  (1.702 m)  Wt 152 lb 12.8 oz (69.31 kg)  BMI 23.93 kg/m2  SpO2 97%, BMI Body mass index is 23.93 kg/(m^2).  Wt Readings from Last 3 Encounters:  03/02/16 152 lb 12.8 oz (69.31 kg)  02/14/16 147 lb (66.679 kg)  12/16/15 149 lb 6.4 oz (67.767 kg)    General: Elderly male, appears comfortable at rest. HEENT: Conjunctiva and lids normal, oropharynx clear. Neck: Supple, no elevated JVP or carotid bruits, no thyromegaly. Lungs: Clear to auscultation, nonlabored breathing at rest. Cardiac: Irregularly irregular, no S3 or significant systolic murmur, no pericardial rub. Abdomen: Soft, nontender, bowel sounds present, no guarding or rebound. Extremities: No pitting edema, distal pulses 2+. Skin: Warm and dry. Musculoskeletal: Mild kyphosis. Neuropsychiatric: Alert and oriented x3, affect grossly appropriate. Trouble with recall of historical details.  ECG: I personally reviewed the prior tracing from 02/12/2016 which showed rate-controlled atrial fibrillation with LVH, old inferior and anterior infarct pattern, and repolarization abnormalities.  Recent Labwork: 02/12/2016: ALT 13*; AST 13* 02/13/2016: BUN 22*; Creatinine, Ser 1.08; Potassium 3.3*; Sodium 142 02/15/2016: Hemoglobin 11.4*; Platelets 142*     Component Value Date/Time   CHOL 129 12/16/2015 0931   TRIG 94 12/16/2015 0931   HDL 51 12/16/2015 0931   CHOLHDL 2.5 12/16/2015 0931   VLDL 19 12/16/2015 0931   LDLCALC 59 12/16/2015 0931    Other Studies Reviewed Today:  Echocardiogram 05/21/2014: Study Conclusions  - Left ventricle: The cavity size  was mildly dilated. Wall thickness was normal. The estimated ejection fraction was 48% by Simpson&'s biplane. Mild inferior hypokinesis. The study is not technically sufficient to allow evaluation of LV diastolic function. - Mitral valve: Mildly thickened leaflets . There was mild regurgitation. - Left atrium: Severely dilated (68 ml/m2). - Atrial septum: No defect or patent foramen ovale was identified. - Tricuspid valve: There was trivial regurgitation. - Pulmonary arteries: PA peak pressure: 27 mm Hg (S).  Impressions:  - [name] 48% by Simpson&'s, mild inferior hypokinesis, severe LAE, mild MR, trace to mild TR, normal RVSP.  Cardiac catheterization 10/30/2004: ANGIOGRAPHIC DATA: 1. The left coronary artery arises and distributes normally. The left main  coronary artery has minor wall irregularities less than 10%. 2. Left anterior descending artery is a large vessel which extends to the  apex. It also has mild wall irregularities less than 10%. There is a  large septal perforator branch which appears normal. There are two  large diagonal branches which predominantly supply the lateral wall and  they appear normal. 3. The left circumflex coronary artery is a small vessel. It is occluded  proximally. There are right to left collaterals to the distal  circumflex. 4. The right coronary artery arises and distributes normally. This is a  large, dominant vessel. There is a stent visible in the proximal  vessel. It appears widely patent. There are mild irregularities  throughout the proximal mid and distal right coronary artery. The  posterior descending artery has an eccentric 50-70% stenosis in the  proximal vessel. This is a focal lesion. There are two posterolateral  branches which are without significant disease.  LEFT VENTRICULOGRAPHY: Left ventricular angiography performed in the RAO view  demonstrates moderate hypokinesia involving the anterolateral wall and inferior wall. Ejection fraction is estimated at 35-40%. There is no significant mitral insufficiency.  FINAL INTERPRETATION: 1. Two vessel atherosclerotic coronary artery disease. There is continued  excellent long-term patency of the prior angioplasty site in the left  anterior descending and the prior stent site in the proximal right  coronary artery. There is total occlusion of the proximal circumflex  which is a small branch and is not suitable for intervention. There is  moderate disease in the proximal posterior descending artery. 2. Moderate left ventricular dysfunction.  Assessment and Plan:  1. History of CAD status post PTCA of the LAD in 1992 and BMS to the RCA in 2004, both in the setting of myocardial infarctions. Last cardiac catheterization is outlined above. At this time I would anticipate conservative medical therapy in the absence of any progressive angina symptoms.  2. Ischemic cardiomyopathy, [name] approximately 45% by last echocardiogram. Reports no peripheral edema, no orthopnea or PND. At present not requiring diuretic.  3. Recent upper GI bleed related to esophagitis. He was evaluated by Dr. Amedeo Plenty with EGD. He will continue on PPI, has already resumed Eliquis without difficulty so far.  4. Chronic atrial fibrillation, continuing with strategy of heart rate control and anticoagulation. He will need to have follow-up CBC and BMS in at least 6 months.  5. Hyperlipidemia, on Mevacor, recent LDL 59.  6. Suspected dementia. Patient will be establishing with a PCP in this area.  Current medicines were reviewed with the patient today.  Disposition: FU with me in 6 months.   Signed, Satira Sark, MD, [facility] 03/02/2016 8:44 AM    [facility], [place], [place] [address] Phone: [phone]; Fax: [phone]

## 2016-03-06 ENCOUNTER — Other Ambulatory Visit: Payer: Self-pay | Admitting: Cardiology

## 2016-03-06 MED ORDER — DOXAZOSIN MESYLATE 4 MG PO TABS: 4.0000 mg | ORAL_TABLET | Freq: Every day | ORAL | Status: DC

## 2016-03-06 NOTE — Telephone Encounter (Signed)
Refill:  doxazosin (CARDURA) 4 MG tablet   Send to [place] [place] home #

## 2016-03-06 NOTE — Telephone Encounter (Signed)
Done

## 2016-03-08 ENCOUNTER — Telehealth: Payer: Self-pay | Admitting: Family Medicine

## 2016-03-08 NOTE — Telephone Encounter (Signed)
Appreciate FYI, sticky note created  Laroy Apple, MD [facility] 03/08/2016, 5:20 PM

## 2016-03-08 NOTE — Telephone Encounter (Signed)
Spoke to daughter [name] about her father who is new patient with [place] and his apt is may 1st. She will not be able to attend with him and wanted me to let you know that her father has been having some memory loss. Also his wife passed away 18 months ago and since then has been depressed. Daughter also states that her father has stopped taking lexapro and would like you to talk to him about starting it back again. Just an [place]

## 2016-03-12 ENCOUNTER — Encounter: Payer: Self-pay | Admitting: Family Medicine

## 2016-03-12 ENCOUNTER — Ambulatory Visit (INDEPENDENT_AMBULATORY_CARE_PROVIDER_SITE_OTHER): Payer: Medicare Other | Admitting: Family Medicine

## 2016-03-12 VITALS — BP 112/75 | HR 92 | Temp 96.8°F | Ht 67.0 in | Wt 149.2 lb

## 2016-03-12 DIAGNOSIS — D649 Anemia, unspecified: Secondary | ICD-10-CM | POA: Insufficient documentation

## 2016-03-12 DIAGNOSIS — I482 Chronic atrial fibrillation, unspecified: Secondary | ICD-10-CM

## 2016-03-12 DIAGNOSIS — Z7901 Long term (current) use of anticoagulants: Secondary | ICD-10-CM

## 2016-03-12 DIAGNOSIS — I259 Chronic ischemic heart disease, unspecified: Secondary | ICD-10-CM | POA: Diagnosis not present

## 2016-03-12 DIAGNOSIS — R413 Other amnesia: Secondary | ICD-10-CM

## 2016-03-12 NOTE — Progress Notes (Addendum)
   HPI  Caretaker [name] is with him, as well as his nephew Merry Proud  Patient presents today to establish care and discuss a few medical issues.  He's had a recent hospitalization for esophagitis with upper GI bleed. He's restarted Eliquis a week after he was discharged and doing well. He's had some intermittent episodes of abdominal pain that are much less significant and severe than previously. He's tolerating food and fluids well. He has good compliance with Protonix. He has followed up with GI a couple weeks ago.  Memory loss Seems to have short-term memory loss but is concerned about. Merry Proud his nephew and [name] his daughter, who is not present today but has called and left messages, believe that he would probably benefit from depression medication, previously he used Zoloft and did very well. Today he denies any depression, I will plan to do the geriatric depression scale next visit. We will also plan to do an MMSE next visit  A. fib Good medication compliance  Esophagitis with upper GI bleed, no additional bleeding noticed, denies melena   PMH: Smoking status noted His past medical, surgical, social, family history reviewed and updated in EMR ROS: Per HPI  Objective: BP 112/75 mmHg  Pulse 92  Temp(Src) 96.8 F (36 C) (Oral)  Ht '5\' 7"'$  (1.702 m)  Wt 149 lb 3.2 oz (67.677 kg)  BMI 23.36 kg/m2 Gen: NAD, alert, cooperative with exam HEENT: NCAT, moist mucosa, TMs obscured by cerumen, nares with swollen turbinate on the left CV: RRR, good S1/S2, no murmur Resp: CTABL, no wheezes, non-labored Abd: SNTND, BS present, no guarding or organomegaly Ext: No edema, warm Neuro: Alert and conversational, no gross deficits  Depression screen [facility] 2/9 03/12/2016  Decreased Interest 0  Down, Depressed, Hopeless 0  PHQ - 2 Score 0      Assessment and plan:  # Memory loss Subjective, plan to check him with an MMSE and geriatric depression scale next visit Follow-up in 4 weeks Could  be depression related  # A. fib, chronic anticoagulation use No signs of bleeding Rate controlled without medications On Eliquis  # Esophagitis with upper GI bleed Repeating CBC today Continue Protonix twice a day Has had mild recurrence of pain, however it has not been persistent and he is tolerating food and fluids easily Continue to monitor closely  One-month follow-up      Orders Placed This Encounter  Procedures  . CMP14+EGFR  . CBC with Differential  . TSH    Laroy Apple, MD [facility] 03/12/2016, 1:49 PM

## 2016-03-12 NOTE — Patient Instructions (Addendum)
Great to meet you!  Lets see you back in 4 weeks to evaluate your memory officially.   We will call within 1 week with lab results

## 2016-03-13 LAB — CMP14+EGFR
ALK PHOS: 52 IU/L (ref 39–117)
ALT: 9 IU/L (ref 0–44)
AST: 7 IU/L (ref 0–40)
Albumin/Globulin Ratio: 2 (ref 1.2–2.2)
Albumin: 4.1 g/dL (ref 3.5–4.7)
BUN/Creatinine Ratio: 19 (ref 10–24)
BUN: 20 mg/dL (ref 8–27)
Bilirubin Total: 0.6 mg/dL (ref 0.0–1.2)
CALCIUM: 9 mg/dL (ref 8.6–10.2)
CO2: 24 mmol/L (ref 18–29)
CREATININE: 1.06 mg/dL (ref 0.76–1.27)
Chloride: 101 mmol/L (ref 96–106)
GFR calc Af Amer: 75 mL/min/{1.73_m2} (ref >59)
GFR, EST NON AFRICAN AMERICAN: 65 mL/min/{1.73_m2} (ref >59)
Globulin, Total: 2.1 g/dL (ref 1.5–4.5)
Glucose: 86 mg/dL (ref 65–99)
POTASSIUM: 4.7 mmol/L (ref 3.5–5.2)
SODIUM: 140 mmol/L (ref 134–144)
Total Protein: 6.2 g/dL (ref 6.0–8.5)

## 2016-03-13 LAB — TSH: TSH: 1.69 u[IU]/mL (ref 0.450–4.500)

## 2016-03-13 LAB — CBC WITH DIFFERENTIAL/PLATELET
BASOS: 0 %
Basophils Absolute: 0 10*3/uL (ref 0.0–0.2)
EOS (ABSOLUTE): 0.1 10*3/uL (ref 0.0–0.4)
EOS: 2 %
Hematocrit: 41.1 % (ref 37.5–51.0)
Hemoglobin: 13.4 g/dL (ref 12.6–17.7)
IMMATURE GRANS (ABS): 0 10*3/uL (ref 0.0–0.1)
IMMATURE GRANULOCYTES: 0 %
LYMPHS: 29 %
Lymphocytes Absolute: 2.1 10*3/uL (ref 0.7–3.1)
MCH: 30.6 pg (ref 26.6–33.0)
MCHC: 32.6 g/dL (ref 31.5–35.7)
MCV: 94 fL (ref 79–97)
MONOS ABS: 0.5 10*3/uL (ref 0.1–0.9)
Monocytes: 7 %
NEUTROS PCT: 62 %
Neutrophils Absolute: 4.5 10*3/uL (ref 1.4–7.0)
PLATELETS: 190 10*3/uL (ref 150–379)
RBC: 4.38 x10E6/uL (ref 4.14–5.80)
RDW: 13.8 % (ref 12.3–15.4)
WBC: 7.2 10*3/uL (ref 3.4–10.8)

## 2016-04-10 ENCOUNTER — Ambulatory Visit: Payer: Medicare Other | Admitting: Family Medicine

## 2016-04-17 ENCOUNTER — Encounter: Payer: Self-pay | Admitting: Family Medicine

## 2016-04-17 ENCOUNTER — Ambulatory Visit (INDEPENDENT_AMBULATORY_CARE_PROVIDER_SITE_OTHER): Payer: Medicare Other | Admitting: Family Medicine

## 2016-04-17 VITALS — BP 108/69 | HR 64 | Temp 96.8°F | Ht 67.0 in | Wt 151.0 lb

## 2016-04-17 DIAGNOSIS — Z7901 Long term (current) use of anticoagulants: Secondary | ICD-10-CM | POA: Diagnosis not present

## 2016-04-17 DIAGNOSIS — I259 Chronic ischemic heart disease, unspecified: Secondary | ICD-10-CM | POA: Diagnosis not present

## 2016-04-17 DIAGNOSIS — I482 Chronic atrial fibrillation, unspecified: Secondary | ICD-10-CM

## 2016-04-17 DIAGNOSIS — I10 Essential (primary) hypertension: Secondary | ICD-10-CM | POA: Diagnosis not present

## 2016-04-17 DIAGNOSIS — R413 Other amnesia: Secondary | ICD-10-CM | POA: Diagnosis not present

## 2016-04-17 NOTE — Patient Instructions (Addendum)
Great to see you!  Lets follow up in 3 months for hypertension

## 2016-04-17 NOTE — Progress Notes (Signed)
   HPI  Patient presents today here for follow-up.  Patient states that he is doing very well, his family is with him, his son from [place] included, they state that he seems better than he has since his wife died about 2 years ago.  He has some mild memory loss which does not bother him. His son and his caretaker that are with him   PMH state that he is very aware and not having any troubles.  He denies depression as well as anhedonia.  He denies chest pain, dyspnea, palpitations, leg edema. He is previously hospitalized for bleeding ulcer/esophagitis, no additional bleeding. He is taking eliquis as prescribed  PMH: Smoking status noted ROS: Per HPI  Objective: BP 108/69 mmHg  Pulse 64  Temp(Src) 96.8 F (36 C) (Oral)  Ht 5\' 7"  (1.702 m)  Wt 151 lb (68.493 kg)  BMI 23.64 kg/m2 Gen: NAD, alert, cooperative with exam HEENT: NCAT CV: RRR, good S1/S2, no murmur Resp: CTABL, no wheezes, non-labored Ext: No edema, warm Neuro: Alert and oriented, No gross deficits  PHQ-2 = 1 Geriatric depression scale - 2- low risk  MMSE - Mini Mental State Exam 04/17/2016  Orientation to time 4  Orientation to Place 4  Registration 3  Attention/ Calculation 3  Recall 1  Language- name 2 objects 2  Language- repeat 1  Language- follow 3 step command 3  Language- read & follow direction 1  Write a sentence 1  Copy design 1  Total score 24     Assessment and plan:  #  memory loss MMSE with score of 24 today Discussed mild cognitive impairment, repeat MMSE in 6 months Continue to monitor closely  extensive testing for depression including the geriatric depression scale were negative.  # A. fib, long-term use of anticoagulant Rate controlled today Continue close No signs of bleeding  Plan labs in 3 months  # Hypertension Low normal today, monitor for hypotension Continue current medications for now, discussed low threshold for follow-up for any symptoms of hypotension     Laroy Apple, MD [facility] 04/17/2016, 11:13 AM

## 2016-05-02 DIAGNOSIS — K219 Gastro-esophageal reflux disease without esophagitis: Secondary | ICD-10-CM | POA: Diagnosis not present

## 2016-05-02 DIAGNOSIS — Z86010 Personal history of colonic polyps: Secondary | ICD-10-CM | POA: Diagnosis not present

## 2016-05-02 DIAGNOSIS — K209 Esophagitis, unspecified: Secondary | ICD-10-CM | POA: Diagnosis not present

## 2016-05-02 DIAGNOSIS — K921 Melena: Secondary | ICD-10-CM | POA: Diagnosis not present

## 2016-05-09 ENCOUNTER — Inpatient Hospital Stay (HOSPITAL_COMMUNITY)
Admission: EM | Admit: 2016-05-09 | Discharge: 2016-05-12 | DRG: 392 | Disposition: A | Discharge status: Home / Self Care | Payer: Medicare Other | Attending: Internal Medicine | Admitting: Internal Medicine

## 2016-05-09 ENCOUNTER — Encounter (HOSPITAL_COMMUNITY): Payer: Self-pay | Admitting: Emergency Medicine

## 2016-05-09 ENCOUNTER — Emergency Department (HOSPITAL_COMMUNITY): Payer: Medicare Other

## 2016-05-09 DIAGNOSIS — I252 Old myocardial infarction: Secondary | ICD-10-CM

## 2016-05-09 DIAGNOSIS — E785 Hyperlipidemia, unspecified: Secondary | ICD-10-CM | POA: Diagnosis present

## 2016-05-09 DIAGNOSIS — Z800 Family history of malignant neoplasm of digestive organs: Secondary | ICD-10-CM

## 2016-05-09 DIAGNOSIS — K210 Gastro-esophageal reflux disease with esophagitis: Secondary | ICD-10-CM | POA: Diagnosis present

## 2016-05-09 DIAGNOSIS — Z7901 Long term (current) use of anticoagulants: Secondary | ICD-10-CM

## 2016-05-09 DIAGNOSIS — I255 Ischemic cardiomyopathy: Secondary | ICD-10-CM | POA: Diagnosis present

## 2016-05-09 DIAGNOSIS — R413 Other amnesia: Secondary | ICD-10-CM | POA: Diagnosis not present

## 2016-05-09 DIAGNOSIS — I10 Essential (primary) hypertension: Secondary | ICD-10-CM | POA: Diagnosis not present

## 2016-05-09 DIAGNOSIS — Z87442 Personal history of urinary calculi: Secondary | ICD-10-CM

## 2016-05-09 DIAGNOSIS — I2510 Atherosclerotic heart disease of native coronary artery without angina pectoris: Secondary | ICD-10-CM | POA: Diagnosis present

## 2016-05-09 DIAGNOSIS — I482 Chronic atrial fibrillation, unspecified: Secondary | ICD-10-CM | POA: Diagnosis present

## 2016-05-09 DIAGNOSIS — I119 Hypertensive heart disease without heart failure: Secondary | ICD-10-CM | POA: Diagnosis not present

## 2016-05-09 DIAGNOSIS — Z823 Family history of stroke: Secondary | ICD-10-CM

## 2016-05-09 DIAGNOSIS — R1084 Generalized abdominal pain: Secondary | ICD-10-CM

## 2016-05-09 DIAGNOSIS — Z8249 Family history of ischemic heart disease and other diseases of the circulatory system: Secondary | ICD-10-CM

## 2016-05-09 DIAGNOSIS — Z79899 Other long term (current) drug therapy: Secondary | ICD-10-CM

## 2016-05-09 DIAGNOSIS — R1013 Epigastric pain: Secondary | ICD-10-CM | POA: Diagnosis present

## 2016-05-09 DIAGNOSIS — Z8719 Personal history of other diseases of the digestive system: Secondary | ICD-10-CM

## 2016-05-09 DIAGNOSIS — G4730 Sleep apnea, unspecified: Secondary | ICD-10-CM | POA: Diagnosis not present

## 2016-05-09 DIAGNOSIS — D72829 Elevated white blood cell count, unspecified: Secondary | ICD-10-CM | POA: Diagnosis present

## 2016-05-09 DIAGNOSIS — E876 Hypokalemia: Secondary | ICD-10-CM | POA: Insufficient documentation

## 2016-05-09 DIAGNOSIS — Z96651 Presence of right artificial knee joint: Secondary | ICD-10-CM | POA: Diagnosis present

## 2016-05-09 DIAGNOSIS — R531 Weakness: Secondary | ICD-10-CM | POA: Diagnosis not present

## 2016-05-09 DIAGNOSIS — R1010 Upper abdominal pain, unspecified: Secondary | ICD-10-CM | POA: Diagnosis not present

## 2016-05-09 DIAGNOSIS — K4090 Unilateral inguinal hernia, without obstruction or gangrene, not specified as recurrent: Secondary | ICD-10-CM | POA: Diagnosis present

## 2016-05-09 DIAGNOSIS — K449 Diaphragmatic hernia without obstruction or gangrene: Principal | ICD-10-CM | POA: Diagnosis present

## 2016-05-09 LAB — CBC
HEMATOCRIT: 41.8 % (ref 39.0–52.0)
HEMOGLOBIN: 14.2 g/dL (ref 13.0–17.0)
MCH: 29.6 pg (ref 26.0–34.0)
MCHC: 34 g/dL (ref 30.0–36.0)
MCV: 87.3 fL (ref 78.0–100.0)
Platelets: 214 10*3/uL (ref 150–400)
RBC: 4.79 MIL/uL (ref 4.22–5.81)
RDW: 13.1 % (ref 11.5–15.5)
WBC: 12.4 10*3/uL — AB (ref 4.0–10.5)

## 2016-05-09 LAB — COMPREHENSIVE METABOLIC PANEL
ALT: 16 U/L — ABNORMAL LOW (ref 17–63)
ANION GAP: 7 (ref 5–15)
AST: 14 U/L — ABNORMAL LOW (ref 15–41)
Albumin: 3.7 g/dL (ref 3.5–5.0)
Alkaline Phosphatase: 45 U/L (ref 38–126)
BILIRUBIN TOTAL: 0.9 mg/dL (ref 0.3–1.2)
BUN: 26 mg/dL — ABNORMAL HIGH (ref 6–20)
CO2: 25 mmol/L (ref 22–32)
Calcium: 8.6 mg/dL — ABNORMAL LOW (ref 8.9–10.3)
Chloride: 102 mmol/L (ref 101–111)
Creatinine, Ser: 1.07 mg/dL (ref 0.61–1.24)
Glucose, Bld: 114 mg/dL — ABNORMAL HIGH (ref 65–99)
POTASSIUM: 4.5 mmol/L (ref 3.5–5.1)
Sodium: 134 mmol/L — ABNORMAL LOW (ref 135–145)
TOTAL PROTEIN: 6.5 g/dL (ref 6.5–8.1)

## 2016-05-09 LAB — URINALYSIS, ROUTINE W REFLEX MICROSCOPIC
Bilirubin Urine: NEGATIVE
Glucose, UA: NEGATIVE mg/dL
Hgb urine dipstick: NEGATIVE
Ketones, ur: 15 mg/dL — AB
LEUKOCYTES UA: NEGATIVE
NITRITE: NEGATIVE
PROTEIN: NEGATIVE mg/dL
Specific Gravity, Urine: 1.027 (ref 1.005–1.030)
pH: 6 (ref 5.0–8.0)

## 2016-05-09 LAB — I-STAT CG4 LACTIC ACID, ED
LACTIC ACID, VENOUS: 0.82 mmol/L (ref 0.5–1.9)
Lactic Acid, Venous: 0.9 mmol/L (ref 0.5–1.9)

## 2016-05-09 LAB — I-STAT TROPONIN, ED
TROPONIN I, POC: 0.02 ng/mL (ref 0.00–0.08)
TROPONIN I, POC: 0 ng/mL (ref 0.00–0.08)

## 2016-05-09 LAB — LIPASE, BLOOD: LIPASE: 49 U/L (ref 11–51)

## 2016-05-09 MED ORDER — FENTANYL CITRATE (PF) 100 MCG/2ML IJ SOLN
25.0000 ug | Freq: Once | INTRAMUSCULAR | Status: AC
  Administered 2016-05-09: 25 ug via INTRAVENOUS
  Filled 2016-05-09: qty 2

## 2016-05-09 MED ORDER — SODIUM CHLORIDE 0.9 % IV SOLN
Freq: Once | INTRAVENOUS | Status: AC
  Administered 2016-05-09: 19:00:00 via INTRAVENOUS

## 2016-05-09 MED ORDER — GI COCKTAIL ~~LOC~~
30.0000 mL | Freq: Once | ORAL | Status: AC
Start: 2016-05-09 — End: 2016-05-09
  Administered 2016-05-09: 30 mL via ORAL
  Filled 2016-05-09: qty 30

## 2016-05-09 MED ORDER — HYDROCODONE-ACETAMINOPHEN 5-325 MG PO TABS
1.0000 | ORAL_TABLET | Freq: Once | ORAL | Status: AC
  Administered 2016-05-09: 1 via ORAL
  Filled 2016-05-09: qty 1

## 2016-05-09 MED ORDER — ACETAMINOPHEN 325 MG PO TABS: 650.0000 mg | ORAL_TABLET | Freq: Four times a day (QID) | ORAL | Status: DC | PRN

## 2016-05-09 MED ORDER — METOPROLOL TARTRATE 12.5 MG HALF TABLET
12.5000 mg | ORAL_TABLET | Freq: Two times a day (BID) | ORAL | Status: DC
  Administered 2016-05-09 – 2016-05-12 (×4): 12.5 mg via ORAL
  Filled 2016-05-09 (×4): qty 1

## 2016-05-09 MED ORDER — ONDANSETRON HCL 4 MG/2ML IJ SOLN: 4.0000 mg | Freq: Three times a day (TID) | INTRAMUSCULAR | Status: DC | PRN

## 2016-05-09 MED ORDER — GI COCKTAIL ~~LOC~~
30.0000 mL | Freq: Once | ORAL | Status: AC
  Administered 2016-05-09: 30 mL via ORAL
  Filled 2016-05-09: qty 30

## 2016-05-09 MED ORDER — SUCRALFATE 1 G PO TABS
1.0000 g | ORAL_TABLET | Freq: Once | ORAL | Status: AC
  Administered 2016-05-09: 1 g via ORAL
  Filled 2016-05-09: qty 1

## 2016-05-09 MED ORDER — SODIUM CHLORIDE 0.9 % IV SOLN
INTRAVENOUS | Status: AC
  Administered 2016-05-09 – 2016-05-10 (×2): via INTRAVENOUS

## 2016-05-09 MED ORDER — SODIUM CHLORIDE 0.9 % IV BOLUS (SEPSIS)
1000.0000 mL | Freq: Once | INTRAVENOUS | Status: AC
  Administered 2016-05-09: 1000 mL via INTRAVENOUS

## 2016-05-09 MED ORDER — MORPHINE SULFATE (PF) 2 MG/ML IV SOLN
1.0000 mg | INTRAVENOUS | Status: DC | PRN
  Administered 2016-05-09 – 2016-05-10 (×2): 1 mg via INTRAVENOUS
  Filled 2016-05-09 (×2): qty 1

## 2016-05-09 MED ORDER — ONDANSETRON HCL 4 MG/2ML IJ SOLN
4.0000 mg | Freq: Four times a day (QID) | INTRAMUSCULAR | Status: DC | PRN
  Administered 2016-05-10: 4 mg via INTRAVENOUS
  Filled 2016-05-09: qty 2

## 2016-05-09 MED ORDER — SODIUM CHLORIDE 0.9 % IV SOLN: INTRAVENOUS | Status: DC

## 2016-05-09 MED ORDER — SUCRALFATE 1 G PO TABS: 1.0000 g | ORAL_TABLET | Freq: Three times a day (TID) | ORAL | Status: DC

## 2016-05-09 MED ORDER — PRAVASTATIN SODIUM 40 MG PO TABS
40.0000 mg | ORAL_TABLET | Freq: Every day | ORAL | Status: DC
  Administered 2016-05-10 – 2016-05-11 (×2): 40 mg via ORAL
  Filled 2016-05-09 (×2): qty 1

## 2016-05-09 MED ORDER — ACETAMINOPHEN 650 MG RE SUPP
650.0000 mg | Freq: Four times a day (QID) | RECTAL | Status: DC | PRN
Start: 2016-05-09 — End: 2016-05-12

## 2016-05-09 MED ORDER — SUCRALFATE 1 G PO TABS
1.0000 g | ORAL_TABLET | Freq: Four times a day (QID) | ORAL | Status: DC
  Administered 2016-05-09 – 2016-05-12 (×9): 1 g via ORAL
  Filled 2016-05-09 (×9): qty 1

## 2016-05-09 MED ORDER — ONDANSETRON HCL 4 MG PO TABS: 4.0000 mg | ORAL_TABLET | Freq: Four times a day (QID) | ORAL | Status: DC | PRN

## 2016-05-09 NOTE — ED Notes (Signed)
Please contact [name] (caregiver)  [phone] if needed. Pt daughter [name]) lives in [place].

## 2016-05-09 NOTE — ED Notes (Signed)
Patient family member came to nurse's desk asking if patient could have graham crackers.  Informed family member I would have to ask PA first.  Spoke with [facility] PA, stated patient has Abd CT scan ordered and needs to wait until results are back before patient eats.   Made patient and family aware that patient is NPO until further notice.

## 2016-05-09 NOTE — ED Provider Notes (Signed)
CSN: [id number]     Arrival date & time 05/09/16  1223 History   First MD Initiated Contact with Patient 05/09/16 1318     Chief Complaint  Patient presents with  . Abdominal Pain     (Consider location/radiation/quality/duration/timing/severity/associated sxs/prior Treatment) HPI Henry Ayala is a 82 y.o. male with history of GERD, MI, ischemic cardiomyopathy, hypertension, A. Fib, esophagitis, presents to emergency department complaining of upper abdominal discomfort, anorexia, nausea. Patient's family is providing most of the history. Patient states he has had intermittent abdominal issues over the last several months. He has seen his gastroenterologist who told him to continue taking his GERD medications. Patient had an endoscopy in April 2017 which showed esophagitis. Patient states he is taking all of his medications as prescribed. She states the last 5 days, patient has had worsening symptoms. He has loss of appetite, upper abdominal discomfort and bloating, nausea. He states pain is radiating from upper abdomen into his chest and he has a lot of belching and burping. Daughter is most concerned that he is not eating and dehydrated. Patient denies any episodes of emesis this time. Normal bowel movements. He denies any fever or chills. His last bowel movement was yesterday. He is taking Protonix for his GERD, and takes Pepto or Maalox as needed. He went to urgent care today was sent here.   Past Medical History  Diagnosis Date  . Hyperlipidemia   . Anteroseptal myocardial infarction [facility])     PTCA to LAD 1992  . Myocardial infarction, inferior wall ([name])     BMS to proximal RCA 2004 at [facility]  . GERD (gastroesophageal reflux disease)   . Sleep apnea     On CPAP  . Nephrolithiasis   . BPH (benign prostatic hyperplasia)   . Hiatal hernia   . Ischemic cardiomyopathy     [name] 48% 2015  . Essential hypertension   . CAD (coronary artery disease)     PTCA to LAD 1992, BMS to RCA 2004   . Chronic atrial fibrillation ([place])   . GI bleed     March 2017 - esophagitis   Past Surgical History  Procedure Laterality Date  . Total knee arthroplasty      right  . Cardioversion N/A 06/30/2014    Procedure: CARDIOVERSION;  Surgeon: Sanda Klein, MD;  Location: [facility] ENDOSCOPY;  Service: Cardiovascular;  Laterality: N/A;  . Esophagogastroduodenoscopy Left 02/14/2016    Procedure: ESOPHAGOGASTRODUODENOSCOPY (EGD);  Surgeon: Wonda Horner, MD;  Location: [facility] ENDOSCOPY;  Service: Endoscopy;  Laterality: Left;   Family History  Problem Relation Age of Onset  . Heart attack Father   . Stroke Mother   . Heart attack Brother   . Colon cancer Sister   . Coronary artery disease Brother    Social History  Substance Use Topics  . Smoking status: Never Smoker   . Smokeless tobacco: Never Used  . Alcohol Use: 0.0 oz/week    0 Standard drinks or equivalent per week    Review of Systems  Constitutional: Positive for appetite change. Negative for fever and chills.  Respiratory: Negative for cough, chest tightness and shortness of breath.   Cardiovascular: Negative for chest pain, palpitations and leg swelling.  Gastrointestinal: Positive for nausea, abdominal pain and abdominal distention. Negative for vomiting and diarrhea.  Genitourinary: Negative for dysuria, urgency, frequency and hematuria.  Musculoskeletal: Negative for myalgias, arthralgias, neck pain and neck stiffness.  Skin: Negative for rash.  Allergic/Immunologic: Negative for immunocompromised state.  Neurological: Positive  for weakness. Negative for dizziness, light-headedness, numbness and headaches.  All other systems reviewed and are negative.     Allergies  Iodinated diagnostic agents; Lipitor; and Sulfa drugs cross reactors  Home Medications   Prior to Admission medications   Medication Sig Start Date End Date Taking? Authorizing Provider  alum & mag hydroxide-simeth (MAALOX/MYLANTA) 200-200-20 MG/5ML  suspension Take 30 mLs by mouth every 6 (six) hours as needed for indigestion or heartburn.   Yes Historical Provider, MD  apixaban (ELIQUIS) 5 MG TABS tablet Take 1 tablet (5 mg total) by mouth 2 (two) times daily. 02/21/16  Yes Charlynne Cousins, MD  bismuth subsalicylate (PEPTO BISMOL) 262 MG/15ML suspension Take 30 mLs by mouth every 6 (six) hours as needed for diarrhea or loose stools.   Yes Historical Provider, MD  doxazosin (CARDURA) 4 MG tablet Take 1 tablet (4 mg total) by mouth daily. 03/06/16  Yes Satira Sark, MD  ibuprofen (ADVIL,MOTRIN) 200 MG tablet Take 400 mg by mouth every 6 (six) hours as needed for moderate pain.   Yes Historical Provider, MD  lisinopril (PRINIVIL,ZESTRIL) 10 MG tablet TAKE ONE TABLET BY MOUTH ONCE DAILY 11/08/15  Yes Darlin Coco, MD  lovastatin (MEVACOR) 40 MG tablet TAKE ONE TABLET BY MOUTH ONCE DAILY 11/08/15  Yes Darlin Coco, MD  metoprolol tartrate (LOPRESSOR) 25 MG tablet TAKE ONE-HALF TABLET BY MOUTH ONCE DAILY OR TAKE AS DIRECTED 11/08/15  Yes Darlin Coco, MD  pantoprazole (PROTONIX) 40 MG tablet Take 1 tablet (40 mg total) by mouth 2 (two) times daily. 02/27/16  Yes Satira Sark, MD   BP 116/73 mmHg  Pulse 106  Temp(Src) 96.8 F (36 C) (Axillary)  Resp 16 Physical Exam  Constitutional: He is oriented to person, place, and time. He appears well-developed and well-nourished. No distress.  HENT:  Head: Normocephalic and atraumatic.  Eyes: Conjunctivae are normal.  Neck: Neck supple.  Cardiovascular: Normal rate, regular rhythm and normal heart sounds.   Pulmonary/Chest: Effort normal. No respiratory distress. He has no wheezes. He has no rales.  Abdominal: Soft. Bowel sounds are normal. He exhibits no distension. There is tenderness. There is no rebound.  Epigastric tenderness. no guarding, no rebound tenderness  Musculoskeletal: He exhibits no edema.  Neurological: He is alert and oriented to person, place, and time.   Skin: Skin is warm and dry.  Nursing note and vitals reviewed.   ED Course  Procedures (including critical care time) Labs Review Labs Reviewed  COMPREHENSIVE METABOLIC PANEL - Abnormal; Notable for the following:    Sodium 134 (*)    Glucose, Bld 114 (*)    BUN 26 (*)    Calcium 8.6 (*)    AST 14 (*)    ALT 16 (*)    All other components within normal limits  CBC - Abnormal; Notable for the following:    WBC 12.4 (*)    All other components within normal limits  URINALYSIS, ROUTINE W REFLEX MICROSCOPIC (NOT AT [facility]) - Abnormal; Notable for the following:    Ketones, ur 15 (*)    All other components within normal limits  LIPASE, BLOOD  I-STAT TROPOININ, ED  I-STAT CG4 LACTIC ACID, ED    Imaging Review Ct Abdomen Pelvis Wo Contrast  05/09/2016  CLINICAL DATA:  Upper abdominal pain for 4 days EXAM: CT ABDOMEN AND PELVIS WITHOUT CONTRAST TECHNIQUE: Multidetector CT imaging of the abdomen and pelvis was performed following the standard protocol without IV contrast. COMPARISON:  02/12/2016 FINDINGS: Lower  chest: Lung bases are free of acute infiltrate or sizable effusion. Minimal scarring is noted bilaterally. There again noted changes consistent with a large diaphragmatic hernia with a loop of transverse colon as well as considerable omental fat within. The stomach is also noted within the hernia. Hepatobiliary: No mass visualized on this un-enhanced exam. Pancreas: No mass or inflammatory process identified on this un-enhanced exam. Spleen: Within normal limits in size. Adrenals/Urinary Tract: No evidence of urolithiasis or hydronephrosis. No definite mass visualized on this un-enhanced exam. Bladder is partially distended. Prostate is enlarged indenting upon the bladder. Stomach/Bowel: There are changes consistent with a right inguinal hernia containing loops of small bowel without evidence of incarceration. The appendix is within normal limits. Mild diverticular changes noted. Fecal  material is noted throughout the colon. No obstructive changes are seen. Vascular/Lymphatic: Aortoiliac calcifications are noted without aneurysmal dilatation. Reproductive: No mass or other significant abnormality. Other: None. Musculoskeletal: The osseous structures show degenerative change of the lumbar spine. No acute abnormality is seen. IMPRESSION: Large stable hiatal hernia containing both colon and stomach. Right inguinal hernia containing loops of small bowel without incarceration. No other acute abnormality is noted. Electronically Signed   By: Inez Catalina M.D.   On: 05/09/2016 15:16   I have personally reviewed and evaluated these images and lab results as part of my medical decision-making.   EKG Interpretation   Date/Time:  Wednesday May 09 2016 12:39:35 EDT Ventricular Rate:  81 PR Interval:    QRS Duration: 110 QT Interval:  363 QTC Calculation: 422 R Axis:   121 Text Interpretation:  Atrial fibrillation Probable lateral infarct, age  indeterminate Anterior infarct, old new t wave changes in lead 3 and v6  Confirmed by Kathrynn Humble, MD, Thelma Comp [phone]) on 05/09/2016 6:10:20 PM      MDM   Final diagnoses:  Hiatal hernia  Epigastric pain       patient with epigastric abdominal pain, belching, burping, burning sensation in the chest.  This is her recurrent symptoms it may be worse in the last 5 days. He denies any vomiting. No changes in bowels. No fever or chills. No urinary symptoms. Endoscopy in April showed esophagitis. Patient is on Protonix. Will check labs, do CT abdomen and pelvis to rule out obstruction given distention with nausea vs another emergent process.   5:02 PM Labs show slightly elevated BUN at 26. Slightly elevated WBC of 12.4. Otherwise negative. CT showing large hiatal hernia containing stomach and colon. Will add carafate. Continue prilosec. Diet adjustment. Follow up with pcp and surgery. Discussed with Dr. Kathrynn Humble who has seen pt as well and agrees to  the plan.   Filed Vitals:   05/09/16 1234 05/09/16 1534  BP: 116/73 157/96  Pulse: 106   Temp: 96.8 F (36 C)   TempSrc: Axillary   Resp: 16    5:59 PM Pt had some crackers prior to DC. Developed increased epigastric pain. carafate ordered. Will reassess  7:17 PM Pain not improved with carafate, vicodin. Pt appears very uncomfortable. States pain is 8/10. Unable to lay or sit still. Will give fentanyl and another GI cocktail. Dr. Kathrynn Humble to reassess. Will admit.   7:48 PM Spoke with [facility], will admit.   Spoke with Dr. Paulita Fujita with GI, will consult in AM.   Filed Vitals:   05/09/16 1534 05/09/16 1639 05/09/16 1735 05/09/16 2038  BP: 157/96 123/73 100/80 103/71  Pulse:  96 99 70  Temp:      TempSrc:  Resp:   18 18  SpO2:  88% 97% 99%     Jeannett Senior, PA-C 05/09/16 2108  Varney Biles, MD 05/09/16 2359

## 2016-05-09 NOTE — H&P (Signed)
Henry Ayala [id number] DOB: 1931-05-07 DOA: 05/09/2016     PCP: Kenn File, MD   Outpatient Specialists: Cardiology Dub Mikes. GI Amedeo Plenty Patient coming from:    home Lives alone,        Chief Complaint: Epigastric pain  HPI: Henry Ayala is a 82 y.o. male with medical history significant of CAD,  A. fib on long-term anticoagulation, HTN, memory loss, hiatal hernia, upper GI bleed secondary to esophagitis in April 2017    Presented with severe epigastric pain for past 4 days worse with eating he has had a lot of bloating and belching and feeling gassy. He have had some nausea but no vomiting.  Few days ago he forced himself to vomit to hep relief the pressureSometimes when he eats his pain gets really bad. He presented to [facility] urgent care and was sent to emergency department. Patient has had decreased by mouth intake. Reports pain has been radiating to his chest associated belching. Normal bowel movement last time yesterday. Reports taking his Protonix No chest pain per se no shortness of breath  Regarding pertinent Chronic problems: As history of upper GI bleed in April 2017 at that time EGD was done showed  friable esophagitis, he has been on Protonix since and reports being compliant. His anticoagulation was held temporarily been resumed. She has known history of coronary artery disease and atrial fibrillation on anticoagulation with eliquis well rate controlled with Lopressor. Last echogram was in 2015 showed EF of 48% Kris Hartmann dysfunction could not be determined  IN ER: Afebrile heart rate 106 blood pressure 100/80 WBC 12.4 hemoglobin 14.2 sodium 134 gr 1.07 BUN 26 lactic acid 0.82 lipase 49 DT abdomen showing a large stable hiatal hernia containing colon and stomach also right inguinal hernia containing loops of small bowel without incarceration Patient could not tolerate by mouth challenge in ER developed severe abdominal pain He was given Carafate and  Micatin was not any improvements given GI cocktail is much improvement    Hospitalist was called for admission for severe epigastric pain only controllable  Review of Systems:    Pertinent positives include:  abdominal pain, nausea, belching  Constitutional:  No weight loss, night sweats, Fevers, chills, fatigue, weight loss  HEENT:  No headaches, Difficulty swallowing,Tooth/dental problems,Sore throat,  No sneezing, itching, ear ache, nasal congestion, post nasal drip,  Cardio-vascular:  No chest pain, Orthopnea, PND, anasarca, dizziness, palpitations.no Bilateral lower extremity swelling  GI:  No heartburn, indigestion, vomiting, diarrhea, change in bowel habits, loss of appetite, melena, blood in stool, hematemesis Resp:  no shortness of breath at rest. No dyspnea on exertion, No excess mucus, no productive cough, No non-productive cough, No coughing up of blood.No change in color of mucus.No wheezing. Skin:  no rash or lesions. No jaundice GU:  no dysuria, change in color of urine, no urgency or frequency. No straining to urinate.  No flank pain.  Musculoskeletal:  No joint pain or no joint swelling. No decreased range of motion. No back pain.  Psych:  No change in mood or affect. No depression or anxiety. No memory loss.  Neuro: no localizing neurological complaints, no tingling, no weakness, no double vision, no gait abnormality, no slurred speech, no confusion  As per HPI otherwise 10 point review of systems negative.   Past Medical History: Past Medical History  Diagnosis Date  . Hyperlipidemia   . Anteroseptal myocardial infarction [facility])     PTCA to LAD 1992  . Myocardial infarction, inferior  wall ([place])     BMS to proximal RCA 2004 at [facility]  . GERD (gastroesophageal reflux disease)   . Sleep apnea     On CPAP  . Nephrolithiasis   . BPH (benign prostatic hyperplasia)   . Hiatal hernia   . Ischemic cardiomyopathy     [name] 48% 2015  . Essential hypertension   .  CAD (coronary artery disease)     PTCA to LAD 1992, BMS to RCA 2004  . Chronic atrial fibrillation ([place])   . GI bleed     March 2017 - esophagitis   Past Surgical History  Procedure Laterality Date  . Total knee arthroplasty      right  . Cardioversion N/A 06/30/2014    Procedure: CARDIOVERSION;  Surgeon: Sanda Klein, MD;  Location: [facility] ENDOSCOPY;  Service: Cardiovascular;  Laterality: N/A;  . Esophagogastroduodenoscopy Left 02/14/2016    Procedure: ESOPHAGOGASTRODUODENOSCOPY (EGD);  Surgeon: Wonda Horner, MD;  Location: [facility] ENDOSCOPY;  Service: Endoscopy;  Laterality: Left;     Social History:  Ambulatory   independently     reports that he has never smoked. He has never used smokeless tobacco. He reports that he drinks alcohol. He reports that he does not use illicit drugs.  Allergies:   Allergies  Allergen Reactions  . Iodinated Diagnostic Agents     unknown  . Lipitor [Atorvastatin Calcium]     unknown  . Sulfa Drugs Cross Reactors     unknown       Family History:    Family History  Problem Relation Age of Onset  . Heart attack Father   . Stroke Mother   . Heart attack Brother   . Colon cancer Sister   . Coronary artery disease Brother     Medications: Prior to Admission medications   Medication Sig Start Date End Date Taking? Authorizing Provider  alum & mag hydroxide-simeth (MAALOX/MYLANTA) 200-200-20 MG/5ML suspension Take 30 mLs by mouth every 6 (six) hours as needed for indigestion or heartburn.   Yes Historical Provider, MD  apixaban (ELIQUIS) 5 MG TABS tablet Take 1 tablet (5 mg total) by mouth 2 (two) times daily. 02/21/16  Yes Charlynne Cousins, MD  bismuth subsalicylate (PEPTO BISMOL) 262 MG/15ML suspension Take 30 mLs by mouth every 6 (six) hours as needed for diarrhea or loose stools.   Yes Historical Provider, MD  doxazosin (CARDURA) 4 MG tablet Take 1 tablet (4 mg total) by mouth daily. 03/06/16  Yes Satira Sark, MD  ibuprofen  (ADVIL,MOTRIN) 200 MG tablet Take 400 mg by mouth every 6 (six) hours as needed for moderate pain.   Yes Historical Provider, MD  lisinopril (PRINIVIL,ZESTRIL) 10 MG tablet TAKE ONE TABLET BY MOUTH ONCE DAILY 11/08/15  Yes Darlin Coco, MD  lovastatin (MEVACOR) 40 MG tablet TAKE ONE TABLET BY MOUTH ONCE DAILY 11/08/15  Yes Darlin Coco, MD  metoprolol tartrate (LOPRESSOR) 25 MG tablet TAKE ONE-HALF TABLET BY MOUTH ONCE DAILY OR TAKE AS DIRECTED 11/08/15  Yes Darlin Coco, MD  pantoprazole (PROTONIX) 40 MG tablet Take 1 tablet (40 mg total) by mouth 2 (two) times daily. 02/27/16  Yes Satira Sark, MD  sucralfate (CARAFATE) 1 g tablet Take 1 tablet (1 g total) by mouth 4 (four) times daily -  with meals and at bedtime. 05/09/16   Jeannett Senior, PA-C    Physical Exam: Patient Vitals for the past 24 hrs:  BP Temp Temp src Pulse Resp SpO2  05/09/16 1735 100/80 mmHg - -  99 18 97 %  05/09/16 1639 123/73 mmHg - - 96 - (!) 88 %  05/09/16 1534 157/96 mmHg - - - - -  05/09/16 1234 116/73 mmHg (!) 96.8 F (36 C) Axillary 106 16 -    1. General:  in No Acute distress 2. Psychological: Alert and  Oriented 3. Head/ENT:    Dry Mucous Membranes                          Head Non traumatic, neck supple                           Poor Dentition 4. SKIN:  decreased Skin turgor,  Skin clean Dry and intact no rash 5. Heart: Regular rate and rhythm no Murmur, Rub or gallop 6. Lungs:    no wheezes mild crackles   7. Abdomen: Soft, non-tender, Non distended 8. Lower extremities: no clubbing, cyanosis, or edema 9. Neurologically Grossly intact, moving all 4 extremities equally 10. MSK: Normal range of motion   body mass index is unknown because there is no weight on file.  Labs on Admission:   Labs on Admission: I have personally reviewed following labs and imaging studies  CBC:  Recent Labs Lab 05/09/16 1307  WBC 12.4*  HGB 14.2  HCT 41.8  MCV 87.3  PLT [id number]   Basic Metabolic  Panel:  Recent Labs Lab 05/09/16 1307  NA 134*  K 4.5  CL 102  CO2 25  GLUCOSE 114*  BUN 26*  CREATININE 1.07  CALCIUM 8.6*   GFR: CrCl cannot be calculated (Unknown ideal weight.). Liver Function Tests:  Recent Labs Lab 05/09/16 1307  AST 14*  ALT 16*  ALKPHOS 45  BILITOT 0.9  PROT 6.5  ALBUMIN 3.7    Recent Labs Lab 05/09/16 1307  LIPASE 49   No results for input(s): AMMONIA in the last 168 hours. Coagulation Profile: No results for input(s): INR, PROTIME in the last 168 hours. Cardiac Enzymes: No results for input(s): CKTOTAL, CKMB, CKMBINDEX, TROPONINI in the last 168 hours. BNP (last 3 results) No results for input(s): PROBNP in the last 8760 hours. HbA1C: No results for input(s): HGBA1C in the last 72 hours. CBG: No results for input(s): GLUCAP in the last 168 hours. Lipid Profile: No results for input(s): CHOL, HDL, LDLCALC, TRIG, CHOLHDL, LDLDIRECT in the last 72 hours. Thyroid Function Tests: No results for input(s): TSH, T4TOTAL, FREET4, T3FREE, THYROIDAB in the last 72 hours. Anemia Panel: No results for input(s): VITAMINB12, FOLATE, FERRITIN, TIBC, IRON, RETICCTPCT in the last 72 hours. Urine analysis:    Component Value Date/Time   COLORURINE YELLOW 05/09/2016 1314   APPEARANCEUR CLEAR 05/09/2016 1314   LABSPEC 1.027 05/09/2016 1314   PHURINE 6.0 05/09/2016 1314   GLUCOSEU NEGATIVE 05/09/2016 1314   HGBUR NEGATIVE 05/09/2016 1314   BILIRUBINUR NEGATIVE 05/09/2016 1314   KETONESUR 15* 05/09/2016 1314   PROTEINUR NEGATIVE 05/09/2016 1314   NITRITE NEGATIVE 05/09/2016 1314   LEUKOCYTESUR NEGATIVE 05/09/2016 1314   Sepsis Labs: @LABRCNTIP (procalcitonin:4,lacticidven:4) )No results found for this or any previous visit (from the past 240 hour(s)).    UA no evidence of UTI  No results found for: HGBA1C  CrCl cannot be calculated (Unknown ideal weight.).  BNP (last 3 results) No results for input(s): PROBNP in the last 8760  hours.   ECG REPORT  Independently reviewed Rate:81  Rhythm: Atrial fibrillation ST&T Change: No acute  ischemic changes   QTC 422  There were no vitals filed for this visit.   Cultures: No results found for: SDES, SPECREQUEST, CULT, REPTSTATUS   Radiological Exams on Admission: Ct Abdomen Pelvis Wo Contrast  05/09/2016  CLINICAL DATA:  Upper abdominal pain for 4 days EXAM: CT ABDOMEN AND PELVIS WITHOUT CONTRAST TECHNIQUE: Multidetector CT imaging of the abdomen and pelvis was performed following the standard protocol without IV contrast. COMPARISON:  02/12/2016 FINDINGS: Lower chest: Lung bases are free of acute infiltrate or sizable effusion. Minimal scarring is noted bilaterally. There again noted changes consistent with a large diaphragmatic hernia with a loop of transverse colon as well as considerable omental fat within. The stomach is also noted within the hernia. Hepatobiliary: No mass visualized on this un-enhanced exam. Pancreas: No mass or inflammatory process identified on this un-enhanced exam. Spleen: Within normal limits in size. Adrenals/Urinary Tract: No evidence of urolithiasis or hydronephrosis. No definite mass visualized on this un-enhanced exam. Bladder is partially distended. Prostate is enlarged indenting upon the bladder. Stomach/Bowel: There are changes consistent with a right inguinal hernia containing loops of small bowel without evidence of incarceration. The appendix is within normal limits. Mild diverticular changes noted. Fecal material is noted throughout the colon. No obstructive changes are seen. Vascular/Lymphatic: Aortoiliac calcifications are noted without aneurysmal dilatation. Reproductive: No mass or other significant abnormality. Other: None. Musculoskeletal: The osseous structures show degenerative change of the lumbar spine. No acute abnormality is seen. IMPRESSION: Large stable hiatal hernia containing both colon and stomach. Right inguinal hernia  containing loops of small bowel without incarceration. No other acute abnormality is noted. Electronically Signed   By: Inez Catalina M.D.   On: 05/09/2016 15:16    Chart has been reviewed    Assessment/Plan  82 y.o. male with medical history significant of CAD,  A. fib on long-term anticoagulation, HTN, memory loss, hiatal hernia, upper GI bleed secondary to esophagitis in April 2017 epigastric pain found to have hiatal hernia  Present on Admission:  . Acute epigastric pain - most likely secondary to hiatal hernia appreciate GI consult . Benign essential HTN - given soft blood pressure will hold off on blood pressure meds were tonight  . Chronic atrial fibrillation ([name]) - currently rate control continue metoprolol.  . Leukocytosis - unclear etiology at this point, will follow no evidence of infectious process . CAD (coronary artery disease) stable continue metoprolol, statin . Sleep apnea - not using CPAP . Memory loss - chronic mild currently stable but will monitor for worsening    Other plan as per orders.  DVT prophylaxis:  SCD     Code Status:  FULL CODE as per patient    Family Communication:   Family not  at  Bedside     Disposition Plan:  To home once workup is complete and patient is stable   Consults called: [facility] Dr.  Paulita Fujita  Admission status:  obs    Level of care    medical floor       [place] 05/09/2016, 9:35 PM    [facility]  Pager [phone]   after 2 AM please page floor coverage PA If 7AM-7PM, please contact the day team taking care of the patient  [email or web address]  Password [email or web address]

## 2016-05-09 NOTE — Discharge Instructions (Signed)
Continue protnix. Take maalox as needed. Carafate as prescribed as needed. Follow up with primary care doctor and general surgery. Return if any issues.     Hiatal Hernia A hiatal hernia occurs when part of your stomach slides above the muscle that separates your abdomen from your chest (diaphragm). You can be born with a hiatal hernia (congenital), or it may develop over time. In almost all cases of hiatal hernia, only the top part of the stomach pushes through.  Many people have a hiatal hernia with no symptoms. The larger the hernia, the more likely that you will have symptoms. In some cases, a hiatal hernia allows stomach acid to flow back into the tube that carries food from your mouth to your stomach (esophagus). This may cause heartburn symptoms. Severe heartburn symptoms may mean you have developed a condition called gastroesophageal reflux disease (GERD).  CAUSES  Hiatal hernias are caused by a weakness in the opening (hiatus) where your esophagus passes through your diaphragm to attach to the upper part of your stomach. You may be born with a weakness in your hiatus, or a weakness can develop. RISK FACTORS Older age is a major risk factor for a hiatal hernia. Anything that increases pressure on your diaphragm can also increase your risk of a hiatal hernia. This includes:  Pregnancy.  Excess weight.  Frequent constipation. SIGNS AND SYMPTOMS  People with a hiatal hernia often have no symptoms. If symptoms develop, they are almost always caused by GERD. They may include:  Heartburn.  Belching.  Indigestion.  Trouble swallowing.  Coughing or wheezing.  Sore throat.  Hoarseness.  Chest pain. DIAGNOSIS  A hiatal hernia is sometimes found during an exam for another problem. Your health care provider may suspect a hiatal hernia if you have symptoms of GERD. Tests may be done to diagnose GERD. These may include:  X-rays of your stomach or chest.  An upper gastrointestinal  (GI) series. This is an X-ray exam of your GI tract involving the use of a chalky liquid that you swallow. The liquid shows up clearly on the X-ray.  Endoscopy. This is a procedure to look into your stomach using a thin, flexible tube that has a tiny camera and light on the end of it. TREATMENT  If you have no symptoms, you may not need treatment. If you have symptoms, treatment may include:  Dietary and lifestyle changes to help reduce GERD symptoms.  Medicines. These may include:  Over-the-counter antacids.  Medicines that make your stomach empty more quickly.  Medicines that block the production of stomach acid (H2 blockers).  Stronger medicines to reduce stomach acid (proton pump inhibitors).  You may need surgery to repair the hernia if other treatments are not helping. HOME CARE INSTRUCTIONS   Take all medicines as directed by your health care provider.  Quit smoking, if you smoke.  Try to achieve and maintain a healthy body weight.  Eat frequent small meals instead of three large meals a day. This keeps your stomach from getting too full.  Eat slowly.  Do not lie down right after eating.  Do noteat 1-2 hours before bed.   Do not drink beverages with caffeine. These include cola, coffee, cocoa, and tea.  Do not drink alcohol.  Avoid foods that can make symptoms of GERD worse. These may include:  Fatty foods.  Citrus fruits.  Other foods and drinks that contain acid.  Avoid putting pressure on your belly. Anything that puts pressure on your belly  increases the amount of acid that may be pushed up into your esophagus.   Avoid bending over, especially after eating.  Raise the head of your bed by putting blocks under the legs. This keeps your head and esophagus higher than your stomach.  Do not wear tight clothing around your chest or stomach.  Try not to strain when having a bowel movement, when urinating, or when lifting heavy objects. SEEK MEDICAL CARE  IF:  Your symptoms are not controlled with medicines or lifestyle changes.  You are having trouble swallowing.  You have coughing or wheezing that will not go away. SEEK IMMEDIATE MEDICAL CARE IF:  Your pain is getting worse.  Your pain spreads to your arms, neck, jaw, teeth, or back.  You have shortness of breath.  You sweat for no reason.  You feel sick to your stomach (nauseous) or vomit.  You vomit blood.  You have bright red blood in your stools.  You have black, tarry stools.    This information is not intended to replace advice given to you by your health care provider. Make sure you discuss any questions you have with your health care provider.   Document Released: 01/19/2004 Document Revised: 11/19/2014 Document Reviewed: 10/16/2013 Elsevier Interactive Patient Education [employer].

## 2016-05-09 NOTE — ED Notes (Signed)
Pt c/o upper GI pain x 4 days, weakness, nausea, bloating, belching, gas. Sent from [facility].

## 2016-05-10 DIAGNOSIS — I10 Essential (primary) hypertension: Secondary | ICD-10-CM

## 2016-05-10 DIAGNOSIS — R1013 Epigastric pain: Secondary | ICD-10-CM

## 2016-05-10 DIAGNOSIS — K449 Diaphragmatic hernia without obstruction or gangrene: Secondary | ICD-10-CM | POA: Diagnosis not present

## 2016-05-10 DIAGNOSIS — R112 Nausea with vomiting, unspecified: Secondary | ICD-10-CM | POA: Diagnosis not present

## 2016-05-10 DIAGNOSIS — I482 Chronic atrial fibrillation: Secondary | ICD-10-CM | POA: Diagnosis not present

## 2016-05-10 LAB — COMPREHENSIVE METABOLIC PANEL
ALBUMIN: 3 g/dL — AB (ref 3.5–5.0)
ALK PHOS: 36 U/L — AB (ref 38–126)
ALT: 12 U/L — ABNORMAL LOW (ref 17–63)
ANION GAP: 3 — AB (ref 5–15)
AST: 12 U/L — ABNORMAL LOW (ref 15–41)
BILIRUBIN TOTAL: 0.4 mg/dL (ref 0.3–1.2)
BUN: 23 mg/dL — ABNORMAL HIGH (ref 6–20)
CALCIUM: 7.6 mg/dL — AB (ref 8.9–10.3)
CO2: 25 mmol/L (ref 22–32)
Chloride: 107 mmol/L (ref 101–111)
Creatinine, Ser: 0.92 mg/dL (ref 0.61–1.24)
GFR calc non Af Amer: >60 mL/min (ref >60)
GLUCOSE: 99 mg/dL (ref 65–99)
POTASSIUM: 4.1 mmol/L (ref 3.5–5.1)
SODIUM: 135 mmol/L (ref 135–145)
TOTAL PROTEIN: 5.3 g/dL — AB (ref 6.5–8.1)

## 2016-05-10 LAB — CBC
HEMATOCRIT: 34.8 % — AB (ref 39.0–52.0)
HEMOGLOBIN: 11.8 g/dL — AB (ref 13.0–17.0)
MCH: 30.4 pg (ref 26.0–34.0)
MCHC: 33.9 g/dL (ref 30.0–36.0)
MCV: 89.7 fL (ref 78.0–100.0)
Platelets: 208 10*3/uL (ref 150–400)
RBC: 3.88 MIL/uL — AB (ref 4.22–5.81)
RDW: 13.4 % (ref 11.5–15.5)
WBC: 10.7 10*3/uL — ABNORMAL HIGH (ref 4.0–10.5)

## 2016-05-10 LAB — TSH: TSH: 0.703 u[IU]/mL (ref 0.350–4.500)

## 2016-05-10 LAB — MAGNESIUM: MAGNESIUM: 2.3 mg/dL (ref 1.7–2.4)

## 2016-05-10 LAB — PHOSPHORUS: PHOSPHORUS: 2.9 mg/dL (ref 2.5–4.6)

## 2016-05-10 MED ORDER — DEXTROSE-NACL 5-0.9 % IV SOLN
INTRAVENOUS | Status: DC
Start: 2016-05-10 — End: 2016-05-12
  Administered 2016-05-10 – 2016-05-11 (×2): via INTRAVENOUS
  Administered 2016-05-12: 1000 mL via INTRAVENOUS

## 2016-05-10 MED ORDER — FAMOTIDINE IN NACL 20-0.9 MG/50ML-% IV SOLN
20.0000 mg | Freq: Two times a day (BID) | INTRAVENOUS | Status: DC
  Administered 2016-05-10 – 2016-05-12 (×4): 20 mg via INTRAVENOUS
  Filled 2016-05-10 (×5): qty 50

## 2016-05-10 MED ORDER — PANTOPRAZOLE SODIUM 40 MG PO TBEC
40.0000 mg | DELAYED_RELEASE_TABLET | Freq: Two times a day (BID) | ORAL | Status: DC
  Administered 2016-05-10 – 2016-05-12 (×4): 40 mg via ORAL
  Filled 2016-05-10 (×4): qty 1

## 2016-05-10 MED ORDER — METOCLOPRAMIDE HCL 5 MG/ML IJ SOLN
5.0000 mg | Freq: Four times a day (QID) | INTRAMUSCULAR | Status: DC
Start: 2016-05-10 — End: 2016-05-12
  Administered 2016-05-10 – 2016-05-12 (×7): 5 mg via INTRAVENOUS
  Filled 2016-05-10 (×7): qty 2

## 2016-05-10 MED ORDER — MORPHINE SULFATE (PF) 2 MG/ML IV SOLN
2.0000 mg | INTRAVENOUS | Status: DC | PRN
  Administered 2016-05-10: 2 mg via INTRAVENOUS
  Filled 2016-05-10 (×2): qty 1

## 2016-05-10 NOTE — Progress Notes (Signed)
PROGRESS NOTE                                                                                                                                                                                                             Patient Demographics:    Henry Ayala, is a 82 y.o. male, DOB - [birth year], [id number]  Admit date - 05/09/2016   Admitting Physician Toy Baker, MD  Outpatient Primary MD for the patient is Kenn File, MD  LOS -   Outpatient Specialists: [facility]  Chief Complaint  Patient presents with  . Abdominal Pain       Brief Narrative   82 year old male with history of coronary artery disease, A. fib on anticoagulation, hypertension, large hiatal hernia was admitted with upper GI be secondary to esophagitis seen on EGD presented to the ED with 4 day history of poor by mouth intake associated with nausea and one soda vomiting. He also complains of severe dull aching epigastric pain radiating to the chest. Denies melena, vomiting or hematemesis. In the ED vitals were stable. Blood work showed WBC of 12.4, hemoglobin of 13 BUN of 23 CT of the abdomen and pelvis showed large stable hiatal hernia containing both colon and stomach. Right inguinal hernia without obstruction. Admitted for further management. [facility] consulted    Subjective:   Patient complains of ongoing abdominal pain and some nausea. No vomiting   Assessment  & Plan :   Principal problem Epigastric pain Suspect this is due to his hiatal hernia and possible recurrence of esophagitis. Cannot rule out underlying peptic ulcer disease although EGD 2 months back was negative for gastric or duodenal ulcer. Keep nothing by mouth. Will increase PPI to twice a day. Add twice a day Pepcid and continue Carafate. Add scheduled Reglan. Check stool for occult blood. IV morphine 2 mg every 4 hours as needed for pain.  [facility] consulted.   Active Problems:    Chronic atrial fibrillation ([name]) Rate controlled. Hold off on Xarelto for now.    Benign essential HTN Stable. Continue metoprolol.  Coronary artery disease Continue beta blocker and statin.        Code Status : Full code  Family Communication  : Daughter and nephew at bedside  Disposition Plan  : Home once symptoms improved  Barriers For Discharge : Active symptoms  Consults  : [facility]  Procedures  : CT abdomen and pelvis  DVT Prophylaxis  :  SCDs  Lab Results  Component Value Date   PLT 208 05/10/2016    Antibiotics  :    Anti-infectives    None        Objective:   Filed Vitals:   05/09/16 2038 05/09/16 2053 05/10/16 0443 05/10/16 0944  BP: 103/71 126/77 99/69 110/67  Pulse: 70 77 82 81  Temp:  98 F (36.7 C) 98.5 F (36.9 C) 98.2 F (36.8 C)  TempSrc:  Oral Oral Oral  Resp: 18 20 20 18   SpO2: 99% 94% 100% 99%    Wt Readings from Last 3 Encounters:  04/17/16 68.493 kg (151 lb)  03/12/16 67.677 kg (149 lb 3.2 oz)  03/02/16 69.31 kg (152 lb 12.8 oz)    No intake or output data in the 24 hours ending 05/10/16 1326   Physical Exam  Gen: Some distress with pain HEENT: no pallor, dry mucosa, supple neck Chest: clear b/l, no added sounds CVS: S1 and S2 irregular, no murmurs, rubs or gallop GI: soft, NT, ND, BS+ Musculoskeletal: warm, no edema CNS: Alert and oriented    Data Review:    CBC  Recent Labs Lab 05/09/16 1307 05/10/16 0521  WBC 12.4* 10.7*  HGB 14.2 11.8*  HCT 41.8 34.8*  PLT 214 208  MCV 87.3 89.7  MCH 29.6 30.4  MCHC 34.0 33.9  RDW 13.1 13.4    Chemistries   Recent Labs Lab 05/09/16 1307 05/10/16 0521  NA 134* 135  K 4.5 4.1  CL 102 107  CO2 25 25  GLUCOSE 114* 99  BUN 26* 23*  CREATININE 1.07 0.92  CALCIUM 8.6* 7.6*  MG  --  2.3  AST 14* 12*  ALT 16* 12*  ALKPHOS 45 36*  BILITOT 0.9 0.4    ------------------------------------------------------------------------------------------------------------------ No results for input(s): CHOL, HDL, LDLCALC, TRIG, CHOLHDL, LDLDIRECT in the last 72 hours.  No results found for: HGBA1C ------------------------------------------------------------------------------------------------------------------  Recent Labs  05/10/16 0521  TSH 0.703   ------------------------------------------------------------------------------------------------------------------ No results for input(s): VITAMINB12, FOLATE, FERRITIN, TIBC, IRON, RETICCTPCT in the last 72 hours.  Coagulation profile No results for input(s): INR, PROTIME in the last 168 hours.  No results for input(s): DDIMER in the last 72 hours.  Cardiac Enzymes No results for input(s): CKMB, TROPONINI, MYOGLOBIN in the last 168 hours.  Invalid input(s): CK ------------------------------------------------------------------------------------------------------------------ No results found for: BNP  Inpatient Medications  Scheduled Meds: . famotidine (PEPCID) IV  20 mg Intravenous Q12H  . metoCLOPramide (REGLAN) injection  5 mg Intravenous Q6H  . metoprolol tartrate  12.5 mg Oral BID  . pantoprazole  40 mg Oral BID  . pravastatin  40 mg Oral q1800  . sucralfate  1 g Oral Q6H   Continuous Infusions: . dextrose 5 % and 0.9% NaCl 75 mL/hr at 05/10/16 1310   PRN Meds:.acetaminophen **OR** acetaminophen, morphine injection, ondansetron **OR** ondansetron (ZOFRAN) IV  Micro Results No results found for this or any previous visit (from the past 240 hour(s)).  Radiology Reports Ct Abdomen Pelvis Wo Contrast  05/09/2016  CLINICAL DATA:  Upper abdominal pain for 4 days EXAM: CT ABDOMEN AND PELVIS WITHOUT CONTRAST TECHNIQUE: Multidetector CT imaging of the abdomen and pelvis was performed following the standard protocol without IV contrast. COMPARISON:  02/12/2016 FINDINGS: Lower chest: Lung  bases are free of acute infiltrate or sizable effusion. Minimal scarring is noted bilaterally. There again noted changes consistent  with a large diaphragmatic hernia with a loop of transverse colon as well as considerable omental fat within. The stomach is also noted within the hernia. Hepatobiliary: No mass visualized on this un-enhanced exam. Pancreas: No mass or inflammatory process identified on this un-enhanced exam. Spleen: Within normal limits in size. Adrenals/Urinary Tract: No evidence of urolithiasis or hydronephrosis. No definite mass visualized on this un-enhanced exam. Bladder is partially distended. Prostate is enlarged indenting upon the bladder. Stomach/Bowel: There are changes consistent with a right inguinal hernia containing loops of small bowel without evidence of incarceration. The appendix is within normal limits. Mild diverticular changes noted. Fecal material is noted throughout the colon. No obstructive changes are seen. Vascular/Lymphatic: Aortoiliac calcifications are noted without aneurysmal dilatation. Reproductive: No mass or other significant abnormality. Other: None. Musculoskeletal: The osseous structures show degenerative change of the lumbar spine. No acute abnormality is seen. IMPRESSION: Large stable hiatal hernia containing both colon and stomach. Right inguinal hernia containing loops of small bowel without incarceration. No other acute abnormality is noted. Electronically Signed   By: Inez Catalina M.D.   On: 05/09/2016 15:16    Time Spent in minutes  25   Louellen Molder M.D on 05/10/2016 at 1:26 PM  Between 7am to 7pm - Pager - [phone]  After 7pm go to [email or web address] - password [facility]  [facility] -  Office  [phone]

## 2016-05-10 NOTE — Consult Note (Signed)
Referring Provider: Dr. Clementeen Graham Primary Care Physician:  Kenn File, MD Primary Gastroenterologist:  Dr. Oletta Lamas  Reason for Consultation:  Abdominal pain; Abnormal CT scan  HPI: Henry Ayala is a 82 y.o. male with history of CAD, Afib and HTN who had an EGD in 4/17 that showed erosive esophagitis is seen for a consult due to abdominal pain. For the past 5 days he has been having severe epigastric pain with frequent belching. +N without vomiting. Eating worsens the pain at times. Reports chronic belching where he has to sit up to belch and relieve the pressure but the frequency and intensity of this belching has worsened. Poor appetite this week and has lost 5 pounds unintentionally this week. BMs decreased this week and usually has one daily. Consistency usually varies. Denies melena or hematochezia. Surveillance colonoscopy in 2013 showed benign polyps, diverticulosis, and internal hemorrhoids. CT shows a large hiatal hernia with the stomach within the hernia and also a loop of the transverse colon and omental fat in the hernia. He feels a lot better overall then he did at admission but is still having the ongoing abdominal pain. Daughter at the bedside.   Past Medical History  Diagnosis Date  . Hyperlipidemia   . Anteroseptal myocardial infarction [facility])     PTCA to LAD 1992  . Myocardial infarction, inferior wall ([name])     BMS to proximal RCA 2004 at [facility]  . GERD (gastroesophageal reflux disease)   . Sleep apnea     On CPAP  . Nephrolithiasis   . BPH (benign prostatic hyperplasia)   . Hiatal hernia   . Ischemic cardiomyopathy     [name] 48% 2015  . Essential hypertension   . CAD (coronary artery disease)     PTCA to LAD 1992, BMS to RCA 2004  . Chronic atrial fibrillation ([place])   . GI bleed     March 2017 - esophagitis    Past Surgical History  Procedure Laterality Date  . Total knee arthroplasty      right  . Cardioversion N/A 06/30/2014    Procedure: CARDIOVERSION;   Surgeon: Sanda Klein, MD;  Location: [facility] ENDOSCOPY;  Service: Cardiovascular;  Laterality: N/A;  . Esophagogastroduodenoscopy Left 02/14/2016    Procedure: ESOPHAGOGASTRODUODENOSCOPY (EGD);  Surgeon: Wonda Horner, MD;  Location: [facility] ENDOSCOPY;  Service: Endoscopy;  Laterality: Left;    Prior to Admission medications   Medication Sig Start Date End Date Taking? Authorizing Provider  alum & mag hydroxide-simeth (MAALOX/MYLANTA) 200-200-20 MG/5ML suspension Take 30 mLs by mouth every 6 (six) hours as needed for indigestion or heartburn.   Yes Historical Provider, MD  apixaban (ELIQUIS) 5 MG TABS tablet Take 1 tablet (5 mg total) by mouth 2 (two) times daily. 02/21/16  Yes Charlynne Cousins, MD  bismuth subsalicylate (PEPTO BISMOL) 262 MG/15ML suspension Take 30 mLs by mouth every 6 (six) hours as needed for diarrhea or loose stools.   Yes Historical Provider, MD  doxazosin (CARDURA) 4 MG tablet Take 1 tablet (4 mg total) by mouth daily. 03/06/16  Yes Satira Sark, MD  ibuprofen (ADVIL,MOTRIN) 200 MG tablet Take 400 mg by mouth every 6 (six) hours as needed for moderate pain.   Yes Historical Provider, MD  lisinopril (PRINIVIL,ZESTRIL) 10 MG tablet TAKE ONE TABLET BY MOUTH ONCE DAILY 11/08/15  Yes Darlin Coco, MD  lovastatin (MEVACOR) 40 MG tablet TAKE ONE TABLET BY MOUTH ONCE DAILY 11/08/15  Yes Darlin Coco, MD  metoprolol tartrate (LOPRESSOR) 25 MG tablet  TAKE ONE-HALF TABLET BY MOUTH ONCE DAILY OR TAKE AS DIRECTED 11/08/15  Yes Darlin Coco, MD  pantoprazole (PROTONIX) 40 MG tablet Take 1 tablet (40 mg total) by mouth 2 (two) times daily. 02/27/16  Yes Satira Sark, MD  sucralfate (CARAFATE) 1 g tablet Take 1 tablet (1 g total) by mouth 4 (four) times daily -  with meals and at bedtime. 05/09/16   Tatyana Kirichenko, PA-C    Scheduled Meds: . famotidine (PEPCID) IV  20 mg Intravenous Q12H  . metoCLOPramide (REGLAN) injection  5 mg Intravenous Q6H  . metoprolol tartrate   12.5 mg Oral BID  . pantoprazole  40 mg Oral BID  . pravastatin  40 mg Oral q1800  . sucralfate  1 g Oral Q6H   Continuous Infusions: . dextrose 5 % and 0.9% NaCl 75 mL/hr at 05/10/16 1310   PRN Meds:.acetaminophen **OR** acetaminophen, morphine injection, ondansetron **OR** ondansetron (ZOFRAN) IV  Allergies as of 05/09/2016 - Review Complete 05/09/2016  Allergen Reaction Noted  . Iodinated diagnostic agents  04/25/2011  . Lipitor [atorvastatin calcium]  04/25/2011  . Sulfa drugs cross reactors  04/25/2011    Family History  Problem Relation Age of Onset  . Heart attack Father   . Stroke Mother   . Heart attack Brother   . Colon cancer Sister   . Coronary artery disease Brother     Social History   Social History  . Marital Status: Married    Spouse Name: N/A  . Number of Children: N/A  . Years of Education: N/A   Occupational History  . Not on file.   Social History Main Topics  . Smoking status: Never Smoker   . Smokeless tobacco: Never Used  . Alcohol Use: No  . Drug Use: No  . Sexual Activity: Not on file   Other Topics Concern  . Not on file   Social History Narrative    Review of Systems: All negative except as stated above in HPI.  Physical Exam: Vital signs: Filed Vitals:   05/10/16 0443 05/10/16 0944  BP: 99/69 110/67  Pulse: 82 81  Temp: 98.5 F (36.9 C) 98.2 F (36.8 C)  Resp: 20 18   Last BM Date: 05/07/16 General:   Alert, thin, elderly, pleasant and cooperative in NAD HEENT: anicteric sclera, oropharynx clear Lungs:  Clear throughout to auscultation.   No wheezes, crackles, or rhonchi. No acute distress. Heart:  Regular rate and rhythm; no murmurs, clicks, rubs,  or gallops. Abdomen: epigastric and RUQ tenderness with guarding, soft, nondistended, +BS Rectal:  Deferred Ext: no edema  GI:  Lab Results:  Recent Labs  05/09/16 1307 05/10/16 0521  WBC 12.4* 10.7*  HGB 14.2 11.8*  HCT 41.8 34.8*  PLT 214 208    BMET  Recent Labs  05/09/16 1307 05/10/16 0521  NA 134* 135  K 4.5 4.1  CL 102 107  CO2 25 25  GLUCOSE 114* 99  BUN 26* 23*  CREATININE 1.07 0.92  CALCIUM 8.6* 7.6*   LFT  Recent Labs  05/10/16 0521  PROT 5.3*  ALBUMIN 3.0*  AST 12*  ALT 12*  ALKPHOS 36*  BILITOT 0.4   PT/INR No results for input(s): LABPROT, INR in the last 72 hours.   Studies/Results: Ct Abdomen Pelvis Wo Contrast  05/09/2016  CLINICAL DATA:  Upper abdominal pain for 4 days EXAM: CT ABDOMEN AND PELVIS WITHOUT CONTRAST TECHNIQUE: Multidetector CT imaging of the abdomen and pelvis was performed following the standard protocol  without IV contrast. COMPARISON:  02/12/2016 FINDINGS: Lower chest: Lung bases are free of acute infiltrate or sizable effusion. Minimal scarring is noted bilaterally. There again noted changes consistent with a large diaphragmatic hernia with a loop of transverse colon as well as considerable omental fat within. The stomach is also noted within the hernia. Hepatobiliary: No mass visualized on this un-enhanced exam. Pancreas: No mass or inflammatory process identified on this un-enhanced exam. Spleen: Within normal limits in size. Adrenals/Urinary Tract: No evidence of urolithiasis or hydronephrosis. No definite mass visualized on this un-enhanced exam. Bladder is partially distended. Prostate is enlarged indenting upon the bladder. Stomach/Bowel: There are changes consistent with a right inguinal hernia containing loops of small bowel without evidence of incarceration. The appendix is within normal limits. Mild diverticular changes noted. Fecal material is noted throughout the colon. No obstructive changes are seen. Vascular/Lymphatic: Aortoiliac calcifications are noted without aneurysmal dilatation. Reproductive: No mass or other significant abnormality. Other: None. Musculoskeletal: The osseous structures show degenerative change of the lumbar spine. No acute abnormality is seen.  IMPRESSION: Large stable hiatal hernia containing both colon and stomach. Right inguinal hernia containing loops of small bowel without incarceration. No other acute abnormality is noted. Electronically Signed   By: Inez Catalina M.D.   On: 05/09/2016 15:16    Impression/Plan: 82 yo with severe epigastric tenderness likely due to a large hiatal hernia that contains stomach and colon. Will do an UGIS tomorrow and depending on findings and ongoing symptoms may need a surgical consult. Keep NPO. Supportive care.      Hackberry C.  05/10/2016, 2:12 PM  Pager [phone]  If no answer or after 5 PM call [phone]

## 2016-05-11 ENCOUNTER — Observation Stay (HOSPITAL_COMMUNITY): Payer: Medicare Other

## 2016-05-11 DIAGNOSIS — K4090 Unilateral inguinal hernia, without obstruction or gangrene, not specified as recurrent: Secondary | ICD-10-CM | POA: Diagnosis present

## 2016-05-11 DIAGNOSIS — I2510 Atherosclerotic heart disease of native coronary artery without angina pectoris: Secondary | ICD-10-CM | POA: Diagnosis present

## 2016-05-11 DIAGNOSIS — E785 Hyperlipidemia, unspecified: Secondary | ICD-10-CM | POA: Diagnosis present

## 2016-05-11 DIAGNOSIS — Z79899 Other long term (current) drug therapy: Secondary | ICD-10-CM | POA: Diagnosis not present

## 2016-05-11 DIAGNOSIS — Z8249 Family history of ischemic heart disease and other diseases of the circulatory system: Secondary | ICD-10-CM | POA: Diagnosis not present

## 2016-05-11 DIAGNOSIS — I255 Ischemic cardiomyopathy: Secondary | ICD-10-CM | POA: Diagnosis present

## 2016-05-11 DIAGNOSIS — I482 Chronic atrial fibrillation: Secondary | ICD-10-CM | POA: Diagnosis present

## 2016-05-11 DIAGNOSIS — D72829 Elevated white blood cell count, unspecified: Secondary | ICD-10-CM | POA: Diagnosis present

## 2016-05-11 DIAGNOSIS — K449 Diaphragmatic hernia without obstruction or gangrene: Secondary | ICD-10-CM | POA: Diagnosis present

## 2016-05-11 DIAGNOSIS — Z96651 Presence of right artificial knee joint: Secondary | ICD-10-CM | POA: Diagnosis present

## 2016-05-11 DIAGNOSIS — I252 Old myocardial infarction: Secondary | ICD-10-CM | POA: Diagnosis not present

## 2016-05-11 DIAGNOSIS — G4730 Sleep apnea, unspecified: Secondary | ICD-10-CM | POA: Diagnosis present

## 2016-05-11 DIAGNOSIS — K210 Gastro-esophageal reflux disease with esophagitis: Secondary | ICD-10-CM | POA: Diagnosis present

## 2016-05-11 DIAGNOSIS — Z7901 Long term (current) use of anticoagulants: Secondary | ICD-10-CM | POA: Diagnosis not present

## 2016-05-11 DIAGNOSIS — R112 Nausea with vomiting, unspecified: Secondary | ICD-10-CM | POA: Diagnosis not present

## 2016-05-11 DIAGNOSIS — Z800 Family history of malignant neoplasm of digestive organs: Secondary | ICD-10-CM | POA: Diagnosis not present

## 2016-05-11 DIAGNOSIS — I119 Hypertensive heart disease without heart failure: Secondary | ICD-10-CM | POA: Diagnosis present

## 2016-05-11 DIAGNOSIS — E876 Hypokalemia: Secondary | ICD-10-CM | POA: Diagnosis not present

## 2016-05-11 DIAGNOSIS — R1013 Epigastric pain: Secondary | ICD-10-CM | POA: Diagnosis not present

## 2016-05-11 DIAGNOSIS — R413 Other amnesia: Secondary | ICD-10-CM | POA: Diagnosis present

## 2016-05-11 DIAGNOSIS — Z87442 Personal history of urinary calculi: Secondary | ICD-10-CM | POA: Diagnosis not present

## 2016-05-11 DIAGNOSIS — Z823 Family history of stroke: Secondary | ICD-10-CM | POA: Diagnosis not present

## 2016-05-11 DIAGNOSIS — Z8719 Personal history of other diseases of the digestive system: Secondary | ICD-10-CM | POA: Diagnosis not present

## 2016-05-11 LAB — GLUCOSE, CAPILLARY
Glucose-Capillary: 89 mg/dL (ref 65–99)
Glucose-Capillary: 96 mg/dL (ref 65–99)

## 2016-05-11 LAB — CBC
HEMATOCRIT: 35.5 % — AB (ref 39.0–52.0)
Hemoglobin: 11.9 g/dL — ABNORMAL LOW (ref 13.0–17.0)
MCH: 30.4 pg (ref 26.0–34.0)
MCHC: 33.5 g/dL (ref 30.0–36.0)
MCV: 90.6 fL (ref 78.0–100.0)
Platelets: 205 10*3/uL (ref 150–400)
RBC: 3.92 MIL/uL — ABNORMAL LOW (ref 4.22–5.81)
RDW: 13.6 % (ref 11.5–15.5)
WBC: 8 10*3/uL (ref 4.0–10.5)

## 2016-05-11 MED ORDER — APIXABAN 5 MG PO TABS
5.0000 mg | ORAL_TABLET | Freq: Two times a day (BID) | ORAL | Status: DC
  Administered 2016-05-11 – 2016-05-12 (×2): 5 mg via ORAL
  Filled 2016-05-11 (×2): qty 1

## 2016-05-11 NOTE — Progress Notes (Signed)
Patient ID: Henry Ayala, male   DOB: 1931-07-30, 82 y.o.   MRN: [id number] [facility]astroenterology Progress Note  QUADE OCANAS 82 y.o. 1931-01-01   Subjective: Feels better. Denies abdominal pain, nausea, or vomiting. Daughter at bedside.  Objective: Vital signs in last 24 hours: Filed Vitals:   05/11/16 0049 05/11/16 0535  BP: 91/54 104/58  Pulse: 79 82  Temp: 98.6 F (37 C) 97.5 F (36.4 C)  Resp: 16 16    Physical Exam: Gen: alert, no acute distress, elderly, thin CV: RRR Chest: CTA B Abd: soft, nontender, nondistended, +BS Ext: no edema  Lab Results:  Recent Labs  05/09/16 1307 05/10/16 0521  NA 134* 135  K 4.5 4.1  CL 102 107  CO2 25 25  GLUCOSE 114* 99  BUN 26* 23*  CREATININE 1.07 0.92  CALCIUM 8.6* 7.6*  MG  --  2.3  PHOS  --  2.9    Recent Labs  05/09/16 1307 05/10/16 0521  AST 14* 12*  ALT 16* 12*  ALKPHOS 45 36*  BILITOT 0.9 0.4  PROT 6.5 5.3*  ALBUMIN 3.7 3.0*    Recent Labs  05/10/16 0521 05/11/16 0539  WBC 10.7* 8.0  HGB 11.8* 11.9*  HCT 34.8* 35.5*  MCV 89.7 90.6  PLT 208 205   No results for input(s): LABPROT, INR in the last 72 hours.    Assessment/Plan: Large hiatal hernia without evidence of obstruction or ulceration on UGIS. Clinically has improved since admit and would recommend conservative management. Soft diet and if tolerates then advance to heart healthy diet. If tolerates POs without worsened abdominal pain, then ok to go home this weekend and hold off on surgical evaluation (unless similar symptoms recur as an outpt) but if abd pain returns with eating then will need to be seen by surgery for this large hiatal hernia.   [place] C. 05/11/2016, 2:10 PM  Pager [phone]  If no answer or after 5 PM call [phone]

## 2016-05-11 NOTE — Progress Notes (Signed)
PROGRESS NOTE                                                                                                                                                                                                             Patient Demographics:    Henry Ayala, is a 82 y.o. male, DOB - 1931-03-02, [id number]  Admit date - 05/09/2016   Admitting Physician Toy Baker, MD  Outpatient Primary MD for the patient is Kenn File, MD  LOS -   Outpatient Specialists: [facility]  Chief Complaint  Patient presents with  . Abdominal Pain       Brief Narrative   82 year old male with history of coronary artery disease, A. fib on anticoagulation, hypertension, large hiatal hernia was admitted with upper GI be secondary to esophagitis seen on EGD presented to the ED with 4 day history of poor by mouth intake associated with nausea and one soda vomiting. He also complains of severe dull aching epigastric pain radiating to the chest. Denies melena, vomiting or hematemesis. In the ED vitals were stable. Blood work showed WBC of 12.4, hemoglobin of 13 BUN of 23 CT of the abdomen and pelvis showed large stable hiatal hernia containing both colon and stomach. Right inguinal hernia without obstruction. Admitted for further management. [facility] consulted    Subjective:   Abdominal pain much improved, no N/V   Assessment  & Plan :   Principal problem Epigastric pain Suspect due to  hiatal hernia .  EGD 2 months back was negative for PUD. Keep nothing by mouth.   IV morphine 2 mg every 4 hours as needed for pain.  [facility] consult appreciated.  UGI series shows moderate hiatal hernia without obstruction.  placed on soft diet. If better plan to d/c home tomorrow, if symptoms reoccur will need surgical evaluation.    Active Problems:    Chronic atrial fibrillation ([name]) Rate controlled. Resume xarelto    Benign essential  HTN Stable. Continue metoprolol.  Coronary artery disease Continue beta blocker and statin.        Code Status : Full code  Family Communication  : Daughter and nephew at bedside  Disposition Plan  : Home in am if improved  Barriers For Discharge : Active symptoms  Consults  : [facility]  Procedures  : CT abdomen and pelvis  DVT Prophylaxis  :  SCDs  Lab Results  Component Value Date   PLT 205 05/11/2016    Antibiotics  :    Anti-infectives    None        Objective:   Filed Vitals:   05/10/16 1450 05/10/16 1937 05/11/16 0049 05/11/16 0535  BP: 98/69 95/62 91/54  104/58  Pulse: 97 64 79 82  Temp: 97.6 F (36.4 C) 98.5 F (36.9 C) 98.6 F (37 C) 97.5 F (36.4 C)  TempSrc: Oral Oral Oral Oral  Resp: 20 18 16 16   SpO2: 95% 99% 98% 98%    Wt Readings from Last 3 Encounters:  04/17/16 68.493 kg (151 lb)  03/12/16 67.677 kg (149 lb 3.2 oz)  03/02/16 69.31 kg (152 lb 12.8 oz)     Intake/Output Summary (Last 24 hours) at 05/11/16 1449 Last data filed at 05/11/16 0915  Gross per 24 hour  Intake 1337.5 ml  Output   1290 ml  Net   47.5 ml     Physical Exam  Gen: NAD HEENT: ,Moist mucosa, supple neck Chest: clear b/l, no added sounds CVS: S1 and S2 irregular, no murmurs, rubs or gallop GI: soft, NT, ND, BS+ Musculoskeletal: warm, no edema    Data Review:    CBC  Recent Labs Lab 05/09/16 1307 05/10/16 0521 05/11/16 0539  WBC 12.4* 10.7* 8.0  HGB 14.2 11.8* 11.9*  HCT 41.8 34.8* 35.5*  PLT 214 208 205  MCV 87.3 89.7 90.6  MCH 29.6 30.4 30.4  MCHC 34.0 33.9 33.5  RDW 13.1 13.4 13.6    Chemistries   Recent Labs Lab 05/09/16 1307 05/10/16 0521  NA 134* 135  K 4.5 4.1  CL 102 107  CO2 25 25  GLUCOSE 114* 99  BUN 26* 23*  CREATININE 1.07 0.92  CALCIUM 8.6* 7.6*  MG  --  2.3  AST 14* 12*  ALT 16* 12*  ALKPHOS 45 36*  BILITOT 0.9 0.4    ------------------------------------------------------------------------------------------------------------------ No results for input(s): CHOL, HDL, LDLCALC, TRIG, CHOLHDL, LDLDIRECT in the last 72 hours.  No results found for: HGBA1C ------------------------------------------------------------------------------------------------------------------  Recent Labs  05/10/16 0521  TSH 0.703   ------------------------------------------------------------------------------------------------------------------ No results for input(s): VITAMINB12, FOLATE, FERRITIN, TIBC, IRON, RETICCTPCT in the last 72 hours.  Coagulation profile No results for input(s): INR, PROTIME in the last 168 hours.  No results for input(s): DDIMER in the last 72 hours.  Cardiac Enzymes No results for input(s): CKMB, TROPONINI, MYOGLOBIN in the last 168 hours.  Invalid input(s): CK ------------------------------------------------------------------------------------------------------------------ No results found for: BNP  Inpatient Medications  Scheduled Meds: . famotidine (PEPCID) IV  20 mg Intravenous Q12H  . metoCLOPramide (REGLAN) injection  5 mg Intravenous Q6H  . metoprolol tartrate  12.5 mg Oral BID  . pantoprazole  40 mg Oral BID  . pravastatin  40 mg Oral q1800  . sucralfate  1 g Oral Q6H   Continuous Infusions: . dextrose 5 % and 0.9% NaCl 75 mL/hr at 05/11/16 0600   PRN Meds:.acetaminophen **OR** acetaminophen, morphine injection, ondansetron **OR** ondansetron (ZOFRAN) IV  Micro Results No results found for this or any previous visit (from the past 240 hour(s)).  Radiology Reports Ct Abdomen Pelvis Wo Contrast  05/09/2016  CLINICAL DATA:  Upper abdominal pain for 4 days EXAM: CT ABDOMEN AND PELVIS WITHOUT CONTRAST TECHNIQUE: Multidetector CT imaging of the abdomen and pelvis was performed following the standard protocol without IV contrast. COMPARISON:  02/12/2016 FINDINGS: Lower chest: Lung  bases are free  of acute infiltrate or sizable effusion. Minimal scarring is noted bilaterally. There again noted changes consistent with a large diaphragmatic hernia with a loop of transverse colon as well as considerable omental fat within. The stomach is also noted within the hernia. Hepatobiliary: No mass visualized on this un-enhanced exam. Pancreas: No mass or inflammatory process identified on this un-enhanced exam. Spleen: Within normal limits in size. Adrenals/Urinary Tract: No evidence of urolithiasis or hydronephrosis. No definite mass visualized on this un-enhanced exam. Bladder is partially distended. Prostate is enlarged indenting upon the bladder. Stomach/Bowel: There are changes consistent with a right inguinal hernia containing loops of small bowel without evidence of incarceration. The appendix is within normal limits. Mild diverticular changes noted. Fecal material is noted throughout the colon. No obstructive changes are seen. Vascular/Lymphatic: Aortoiliac calcifications are noted without aneurysmal dilatation. Reproductive: No mass or other significant abnormality. Other: None. Musculoskeletal: The osseous structures show degenerative change of the lumbar spine. No acute abnormality is seen. IMPRESSION: Large stable hiatal hernia containing both colon and stomach. Right inguinal hernia containing loops of small bowel without incarceration. No other acute abnormality is noted. Electronically Signed   By: Inez Catalina M.D.   On: 05/09/2016 15:16   Dg Duanne Limerick  W/kub  05/11/2016  CLINICAL DATA:  82 year old with abdominal pain and belching increasing over the last 5 days. Hiatal hernia. EXAM: UPPER GI SERIES WITH KUB TECHNIQUE: After obtaining a scout radiograph a routine upper GI series was performed using thin barium FLUOROSCOPY TIME:  Radiation Exposure Index (as provided by the fluoroscopic device): 42.4 mGy If the device does not provide the exposure index: Fluoroscopy Time (in minutes and  seconds): 2 minutes and 18 seconds of pulsed fluoro. Number of Acquired Images: 1 scout image. Remainder of study obtained with fluoro store. COMPARISON:  Abdominal pelvic CT 05/01/2016. FINDINGS: The scout abdominal radiograph demonstrates a large hiatal hernia. The heart is enlarged. The bowel gas pattern is normal. The patient swallowed the contrast without difficulty. The esophageal motility appears within normal limits. There is no laryngeal penetration. There is a large hiatal hernia with more than half of the stomach in the lower left chest. Contrast empties from the hiatal hernia into the intra-abdominal stomach and subsequently drains into the small bowel. There is no evidence of mucosal ulceration, obstruction or perforation. Only a small amount of reflux was elicited with the water siphon test. A 13 mm barium tablet was administered and passed without delay into the thoracic portion of stomach. IMPRESSION: 1. Large hiatal hernia. 2. No evidence of associated esophageal stricture. Only mild reflux was observed with the water siphon test. 3. No mucosal abnormalities are seen. Electronically Signed   By: Richardean Sale M.D.   On: 05/11/2016 12:25    Time Spent in minutes  25   Louellen Molder M.D on 05/11/2016 at 2:49 PM  Between 7am to 7pm - Pager - [phone]  After 7pm go to [email or web address] - password [facility]  [facility] -  Office  [phone]

## 2016-05-12 DIAGNOSIS — E876 Hypokalemia: Secondary | ICD-10-CM

## 2016-05-12 MED ORDER — ALUM & MAG HYDROXIDE-SIMETH 200-200-20 MG/5ML PO SUSP: 30.0000 mL | Freq: Four times a day (QID) | ORAL | Status: DC | PRN

## 2016-05-12 MED ORDER — METOCLOPRAMIDE HCL 5 MG PO TABS: 5.0000 mg | ORAL_TABLET | Freq: Three times a day (TID) | ORAL | Status: DC | PRN

## 2016-05-12 NOTE — Progress Notes (Signed)
Patient tolerating regular diet, ambulating in hall without diff..  Patient denies pain.  Discharge instructions review with patient and his daughter, prescriptions given to patient.  Patient discharged home.

## 2016-05-12 NOTE — Progress Notes (Signed)
Patient ID: Henry Ayala, male   DOB: December 01, 1930, 82 y.o.   MRN: [id number]  Doing well. Tolerating solid food. Stable for d/c home. Needs to call his PCP if the severe abdominal pain returns because may need surgical evaluation at that time for this large hiatal hernia. F/U with GI prn. Daughter at bedside.

## 2016-05-12 NOTE — Discharge Summary (Signed)
Physician Discharge Summary  Henry Ayala [id number] DOB: 01-15-1931 DOA: 05/09/2016  PCP: Kenn File, MD  Admit date: 05/09/2016 Discharge date: 05/12/2016  Admitted From: Home Disposition:  Home  Recommendations for Outpatient Follow-up:  Follow up with PCP in 1-2 week. If abdominal pain symptoms reoccurs or worsens he will need outpatient surgery referral for his hiatal hernia.  Home Health: None Equipment/Devices: None  Discharge Condition: Stable CODE STATUS: Full code Diet recommendation: Soft diet, advance to heart healthy in 2 days   Discharge Diagnoses:  Principal Problem:   Acute epigastric pain   Active Problems:   Hiatal hernia   Benign essential HTN   Sleep apnea   Chronic atrial fibrillation ([name])   Memory loss   Leukocytosis   CAD (coronary artery disease)   Right inguinal hernia without obstruction  Brief narrative/history of present illness Please refer to admission H&P for details, in brief, 82 year old male with history of coronary artery disease, A. fib on anticoagulation, hypertension, large hiatal hernia was admitted with upper GI be secondary to esophagitis seen on EGD presented to the ED with 4 day history of poor by mouth intake associated with nausea and one soda vomiting. He also complains of severe dull aching epigastric pain radiating to the chest. Denied melena, vomiting or hematemesis. In the ED vitals were stable. Blood work showed WBC of 12.4, hemoglobin of 13 BUN of 23 CT of the abdomen and pelvis showed large stable hiatal hernia containing both colon and stomach. Right inguinal hernia without obstruction.  Hospital course  Epigastric pain -Suspect due tolarge hiatal hernia . EGD 2 months back was negative for PUD. -kept Nothing by mouth and provided supportive care with pain medications, PPI and Reglan with improvement. Seen by [facility] and underwent upper GI series which shows moderate hiatal hernia without  obstruction. -Placed on soft diet which he is tolerating well. No further pain symptoms. He will be discharged home on soft diet to advance slowly over the next 48 hours. If he has persistent or worsened symptoms he will need to be referred to surgery as outpatient. -Continue PPI. Will prescribe him some Reglan as needed for nausea. -Instructed to avoid NSAIDs  Active Problems:   Chronic atrial fibrillation ([name]) Rate controlled. Continue xarelto   Benign essential HTN Stable. Continue metoprolol.  Coronary artery disease Continue beta blocker and statin.   Patient stable to be discharged home with outpatient PCP follow-up.       Family Communication : Daughter at bedside   Consults : [facility]  Procedures : CT abdomen and pelvis  Discharge Instructions     Medication List    STOP taking these medications        ibuprofen 200 MG tablet  Commonly known as:  ADVIL,MOTRIN      TAKE these medications        alum & mag hydroxide-simeth 200-200-20 MG/5ML suspension  Commonly known as:  MAALOX/MYLANTA  Take 30 mLs by mouth every 6 (six) hours as needed for indigestion or heartburn.     apixaban 5 MG Tabs tablet  Commonly known as:  ELIQUIS  Take 1 tablet (5 mg total) by mouth 2 (two) times daily.     bismuth subsalicylate [id number] suspension  Commonly known as:  PEPTO BISMOL  Take 30 mLs by mouth every 6 (six) hours as needed for diarrhea or loose stools.     doxazosin 4 MG tablet  Commonly known as:  CARDURA  Take 1 tablet (4 mg  total) by mouth daily.     lisinopril 10 MG tablet  Commonly known as:  PRINIVIL,ZESTRIL  TAKE ONE TABLET BY MOUTH ONCE DAILY     lovastatin 40 MG tablet  Commonly known as:  MEVACOR  TAKE ONE TABLET BY MOUTH ONCE DAILY     metoCLOPramide 5 MG tablet  Commonly known as:  REGLAN  Take 1 tablet (5 mg total) by mouth every 8 (eight) hours as needed for nausea or vomiting.     metoprolol tartrate 25 MG tablet  Commonly  known as:  LOPRESSOR  TAKE ONE-HALF TABLET BY MOUTH ONCE DAILY OR TAKE AS DIRECTED     pantoprazole 40 MG tablet  Commonly known as:  PROTONIX  Take 1 tablet (40 mg total) by mouth 2 (two) times daily.           Follow-up Information    Follow up with Kenn File, MD In 1 week.   Specialty:  Family Medicine   Contact information:   [address] [place] [address] [phone]      Allergies  Allergen Reactions  . Iodinated Diagnostic Agents     unknown  . Lipitor [Atorvastatin Calcium]     unknown  . Sulfa Drugs Cross Reactors     unknown       Procedures/Studies: Ct Abdomen Pelvis Wo Contrast  05/09/2016  CLINICAL DATA:  Upper abdominal pain for 4 days EXAM: CT ABDOMEN AND PELVIS WITHOUT CONTRAST TECHNIQUE: Multidetector CT imaging of the abdomen and pelvis was performed following the standard protocol without IV contrast. COMPARISON:  02/12/2016 FINDINGS: Lower chest: Lung bases are free of acute infiltrate or sizable effusion. Minimal scarring is noted bilaterally. There again noted changes consistent with a large diaphragmatic hernia with a loop of transverse colon as well as considerable omental fat within. The stomach is also noted within the hernia. Hepatobiliary: No mass visualized on this un-enhanced exam. Pancreas: No mass or inflammatory process identified on this un-enhanced exam. Spleen: Within normal limits in size. Adrenals/Urinary Tract: No evidence of urolithiasis or hydronephrosis. No definite mass visualized on this un-enhanced exam. Bladder is partially distended. Prostate is enlarged indenting upon the bladder. Stomach/Bowel: There are changes consistent with a right inguinal hernia containing loops of small bowel without evidence of incarceration. The appendix is within normal limits. Mild diverticular changes noted. Fecal material is noted throughout the colon. No obstructive changes are seen. Vascular/Lymphatic: Aortoiliac calcifications are noted  without aneurysmal dilatation. Reproductive: No mass or other significant abnormality. Other: None. Musculoskeletal: The osseous structures show degenerative change of the lumbar spine. No acute abnormality is seen. IMPRESSION: Large stable hiatal hernia containing both colon and stomach. Right inguinal hernia containing loops of small bowel without incarceration. No other acute abnormality is noted. Electronically Signed   By: Inez Catalina M.D.   On: 05/09/2016 15:16   Dg Duanne Limerick  W/kub  05/11/2016  CLINICAL DATA:  82 year old with abdominal pain and belching increasing over the last 5 days. Hiatal hernia. EXAM: UPPER GI SERIES WITH KUB TECHNIQUE: After obtaining a scout radiograph a routine upper GI series was performed using thin barium FLUOROSCOPY TIME:  Radiation Exposure Index (as provided by the fluoroscopic device): 42.4 mGy If the device does not provide the exposure index: Fluoroscopy Time (in minutes and seconds): 2 minutes and 18 seconds of pulsed fluoro. Number of Acquired Images: 1 scout image. Remainder of study obtained with fluoro store. COMPARISON:  Abdominal pelvic CT 05/01/2016. FINDINGS: The scout abdominal radiograph demonstrates a  large hiatal hernia. The heart is enlarged. The bowel gas pattern is normal. The patient swallowed the contrast without difficulty. The esophageal motility appears within normal limits. There is no laryngeal penetration. There is a large hiatal hernia with more than half of the stomach in the lower left chest. Contrast empties from the hiatal hernia into the intra-abdominal stomach and subsequently drains into the small bowel. There is no evidence of mucosal ulceration, obstruction or perforation. Only a small amount of reflux was elicited with the water siphon test. A 13 mm barium tablet was administered and passed without delay into the thoracic portion of stomach. IMPRESSION: 1. Large hiatal hernia. 2. No evidence of associated esophageal stricture. Only mild  reflux was observed with the water siphon test. 3. No mucosal abnormalities are seen. Electronically Signed   By: Richardean Sale M.D.   On: 05/11/2016 12:25       Discharge Exam: Filed Vitals:   05/11/16 2106 05/12/16 0529  BP: 132/89 118/81  Pulse: 95 80  Temp: 97.8 F (36.6 C) 98.1 F (36.7 C)  Resp: 17 18   Filed Vitals:   05/11/16 1530 05/11/16 2106 05/11/16 2113 05/12/16 0529  BP: 118/74 132/89  118/81  Pulse: 82 95  80  Temp: 98 F (36.7 C) 97.8 F (36.6 C)  98.1 F (36.7 C)  TempSrc: Oral Oral  Oral  Resp: 16 17  18   Height:   5\' 8"  (1.727 m)   Weight:   68.04 kg (150 lb)   SpO2: 99% 95%  97%    Gen: NAD HEENT: ,Moist mucosa, supple neck Chest: clear b/l, no added sounds CVS: S1 and S2 irregular, no murmurs,  GI: soft, NT, ND, BS+ Musculoskeletal: warm, no edema   The results of significant diagnostics from this hospitalization (including imaging, microbiology, ancillary and laboratory) are listed below for reference.     Microbiology: No results found for this or any previous visit (from the past 240 hour(s)).   Labs: BNP (last 3 results) No results for input(s): BNP in the last 8760 hours. Basic Metabolic Panel:  Recent Labs Lab 05/09/16 1307 05/10/16 0521  NA 134* 135  K 4.5 4.1  CL 102 107  CO2 25 25  GLUCOSE 114* 99  BUN 26* 23*  CREATININE 1.07 0.92  CALCIUM 8.6* 7.6*  MG  --  2.3  PHOS  --  2.9   Liver Function Tests:  Recent Labs Lab 05/09/16 1307 05/10/16 0521  AST 14* 12*  ALT 16* 12*  ALKPHOS 45 36*  BILITOT 0.9 0.4  PROT 6.5 5.3*  ALBUMIN 3.7 3.0*    Recent Labs Lab 05/09/16 1307  LIPASE 49   No results for input(s): AMMONIA in the last 168 hours. CBC:  Recent Labs Lab 05/09/16 1307 05/10/16 0521 05/11/16 0539  WBC 12.4* 10.7* 8.0  HGB 14.2 11.8* 11.9*  HCT 41.8 34.8* 35.5*  MCV 87.3 89.7 90.6  PLT 214 208 205   Cardiac Enzymes: No results for input(s): CKTOTAL, CKMB, CKMBINDEX, TROPONINI in the  last 168 hours. BNP: Invalid input(s): POCBNP CBG:  Recent Labs Lab 05/11/16 0051 05/11/16 0729  GLUCAP 89 96   D-Dimer No results for input(s): DDIMER in the last 72 hours. Hgb A1c No results for input(s): HGBA1C in the last 72 hours. Lipid Profile No results for input(s): CHOL, HDL, LDLCALC, TRIG, CHOLHDL, LDLDIRECT in the last 72 hours. Thyroid function studies  Recent Labs  05/10/16 0521  TSH 0.703   Anemia work up  No results for input(s): VITAMINB12, FOLATE, FERRITIN, TIBC, IRON, RETICCTPCT in the last 72 hours. Urinalysis    Component Value Date/Time   COLORURINE YELLOW 05/09/2016 1314   APPEARANCEUR CLEAR 05/09/2016 1314   LABSPEC 1.027 05/09/2016 1314   PHURINE 6.0 05/09/2016 1314   GLUCOSEU NEGATIVE 05/09/2016 1314   HGBUR NEGATIVE 05/09/2016 1314   BILIRUBINUR NEGATIVE 05/09/2016 1314   KETONESUR 15* 05/09/2016 1314   PROTEINUR NEGATIVE 05/09/2016 1314   NITRITE NEGATIVE 05/09/2016 1314   LEUKOCYTESUR NEGATIVE 05/09/2016 1314   Sepsis Labs Invalid input(s): PROCALCITONIN,  WBC,  LACTICIDVEN Microbiology No results found for this or any previous visit (from the past 240 hour(s)).   Time coordinating discharge: Over 30 minutes  SIGNED:   Louellen Molder, MD  [facility] 05/12/2016, 10:37 AM Pager   If 7PM-7AM, please contact night-coverage [email or web address] Password [email or web address]

## 2016-05-14 ENCOUNTER — Telehealth: Payer: Self-pay | Admitting: Family Medicine

## 2016-05-14 NOTE — Telephone Encounter (Signed)
Appointment scheduled for Friday 7/7 @ 8:10 with Wendi Snipes.

## 2016-05-18 ENCOUNTER — Encounter: Payer: Self-pay | Admitting: Family Medicine

## 2016-05-18 ENCOUNTER — Ambulatory Visit (INDEPENDENT_AMBULATORY_CARE_PROVIDER_SITE_OTHER): Payer: Medicare Other | Admitting: Family Medicine

## 2016-05-18 VITALS — BP 95/56 | HR 99 | Temp 96.6°F | Ht 68.0 in | Wt 149.0 lb

## 2016-05-18 DIAGNOSIS — K449 Diaphragmatic hernia without obstruction or gangrene: Secondary | ICD-10-CM

## 2016-05-18 DIAGNOSIS — I10 Essential (primary) hypertension: Secondary | ICD-10-CM | POA: Diagnosis not present

## 2016-05-18 DIAGNOSIS — I259 Chronic ischemic heart disease, unspecified: Secondary | ICD-10-CM

## 2016-05-18 DIAGNOSIS — D649 Anemia, unspecified: Secondary | ICD-10-CM | POA: Diagnosis not present

## 2016-05-18 MED ORDER — ESCITALOPRAM OXALATE 5 MG PO TABS: 5.0000 mg | ORAL_TABLET | Freq: Every day | ORAL | Status: DC

## 2016-05-18 MED ORDER — LISINOPRIL 5 MG PO TABS: 5.0000 mg | ORAL_TABLET | Freq: Every day | ORAL | Status: DC

## 2016-05-18 NOTE — Progress Notes (Signed)
   HPI  Patient presents today for hospital follow-up.  Patient was admitted to the hospital with severe abdominal pain. This is attributed to esophagitis and he was found to have a large hiatal hernia containing bowel and part of the stomach.  He states that since leaving the hospital his abdominal pain is resolved. He feels well. He is breathing normally, tolerating food and fluids normally bowels are moving normally.  He has no complaints currently.  His family is concerned about depression. He and his family state that he was controlled for several years on Lexapro. Several days of the last few weeks he's had a hard time getting out of bed, they state that it's hard to determine whether it's his stomach bothering him more at anhedonia. Patient denies any abdominal pain. He is okay with Chana small amount of antidepressant again. He denies any suicidal thoughts.  PMH: Smoking status noted ROS: Per HPI  Objective: BP 95/56 mmHg  Pulse 99  Temp(Src) 96.6 F (35.9 C) (Oral)  Ht _0  (1.727 m)  Wt 149 lb (67.586 kg)  BMI 22.66 kg/m2 Gen: NAD, alert, cooperative with exam HEENT: NCAT CV: Regular rate, irregularly irregular rhythm, no murmur Resp: CTABL, no wheezes, non-labored Ext: No edema, warm Neuro: Alert and oriented, No gross deficits  Assessment and plan:  # Hiatal hernia, esophagitis, abdominal pain Resolved Reviewed films with patient and discussed red flags for return.  Continue twice-daily PPI He is currently also being managed by GI. If his pain returns would recommend general surgery consult to consider hiatal hernia repair.  # Hypertension Systolic 95 today, on review of the hospital chart several blood pressures were 95-105 Reduce lisinopril to 5 mg Continue metoprolol 25 for rate control of A. fib.  # Normocytic anemia, while admitted,  consider delutional anemia Recheck CBC and CMP today.   Orders Placed This Encounter  Procedures  . CBC with  Differential  . CMP14+EGFR    Meds ordered this encounter  Medications  . escitalopram (LEXAPRO) 5 MG tablet    Sig: Take 1 tablet (5 mg total) by mouth daily.    Dispense:  30 tablet    Refill:  3  . lisinopril (PRINIVIL,ZESTRIL) 5 MG tablet    Sig: Take 1 tablet (5 mg total) by mouth daily.    Dispense:  90 tablet    Refill:  [place], MD [facility] 05/18/2016, 8:43 AM

## 2016-05-18 NOTE — Patient Instructions (Signed)
Great to see you!  I have started lexapro, I have also decreased lisinopril to 5 mg   Lets follow up in September as planned previously.   I expect his BP to be around [id number] systolic, as long as it is consistently less than [id number] systolic  I am ok with it.

## 2016-05-19 LAB — CMP14+EGFR
ALBUMIN: 3.2 g/dL — AB (ref 3.5–4.7)
ALT: 12 IU/L (ref 0–44)
AST: 10 IU/L (ref 0–40)
Albumin/Globulin Ratio: 1.5 (ref 1.2–2.2)
Alkaline Phosphatase: 48 IU/L (ref 39–117)
BUN / CREAT RATIO: 14 (ref 10–24)
BUN: 17 mg/dL (ref 8–27)
Bilirubin Total: 0.3 mg/dL (ref 0.0–1.2)
CALCIUM: 8.8 mg/dL (ref 8.6–10.2)
CO2: 25 mmol/L (ref 18–29)
CREATININE: 1.18 mg/dL (ref 0.76–1.27)
Chloride: 101 mmol/L (ref 96–106)
GFR calc Af Amer: 66 mL/min/{1.73_m2} (ref >59)
GFR, EST NON AFRICAN AMERICAN: 57 mL/min/{1.73_m2} — AB (ref >59)
GLUCOSE: 101 mg/dL — AB (ref 65–99)
Globulin, Total: 2.1 g/dL (ref 1.5–4.5)
Potassium: 3.9 mmol/L (ref 3.5–5.2)
Sodium: 139 mmol/L (ref 134–144)
Total Protein: 5.3 g/dL — ABNORMAL LOW (ref 6.0–8.5)

## 2016-05-19 LAB — CBC WITH DIFFERENTIAL/PLATELET
BASOS ABS: 0 10*3/uL (ref 0.0–0.2)
BASOS: 0 %
EOS (ABSOLUTE): 0.2 10*3/uL (ref 0.0–0.4)
EOS: 3 %
HEMATOCRIT: 40.3 % (ref 37.5–51.0)
HEMOGLOBIN: 13.2 g/dL (ref 12.6–17.7)
IMMATURE GRANS (ABS): 0 10*3/uL (ref 0.0–0.1)
Immature Granulocytes: 0 %
LYMPHS ABS: 1.4 10*3/uL (ref 0.7–3.1)
LYMPHS: 21 %
MCH: 29.9 pg (ref 26.6–33.0)
MCHC: 32.8 g/dL (ref 31.5–35.7)
MCV: 91 fL (ref 79–97)
MONOCYTES: 7 %
Monocytes Absolute: 0.5 10*3/uL (ref 0.1–0.9)
NEUTROS ABS: 4.5 10*3/uL (ref 1.4–7.0)
Neutrophils: 69 %
Platelets: 242 10*3/uL (ref 150–379)
RBC: 4.41 x10E6/uL (ref 4.14–5.80)
RDW: 14.2 % (ref 12.3–15.4)
WBC: 6.5 10*3/uL (ref 3.4–10.8)

## 2016-06-07 ENCOUNTER — Telehealth: Payer: Self-pay | Admitting: Family Medicine

## 2016-06-07 MED ORDER — METOPROLOL TARTRATE 25 MG PO TABS: ORAL_TABLET | ORAL | 1 refills | Status: DC

## 2016-06-07 MED ORDER — LOVASTATIN 40 MG PO TABS: 40.0000 mg | ORAL_TABLET | Freq: Every day | ORAL | 1 refills | Status: DC

## 2016-06-07 NOTE — Telephone Encounter (Signed)
Refilled medications per protocol. Last seen 7/7. Pt aware.

## 2016-06-11 ENCOUNTER — Encounter: Payer: Self-pay | Admitting: *Deleted

## 2016-07-09 ENCOUNTER — Other Ambulatory Visit: Payer: Self-pay | Admitting: Cardiology

## 2016-07-19 ENCOUNTER — Encounter: Payer: Self-pay | Admitting: Family Medicine

## 2016-07-19 ENCOUNTER — Ambulatory Visit (INDEPENDENT_AMBULATORY_CARE_PROVIDER_SITE_OTHER): Payer: Medicare Other | Admitting: Family Medicine

## 2016-07-19 VITALS — BP 98/55 | HR 72 | Temp 96.6°F | Ht 68.0 in | Wt 149.8 lb

## 2016-07-19 DIAGNOSIS — D649 Anemia, unspecified: Secondary | ICD-10-CM | POA: Diagnosis not present

## 2016-07-19 DIAGNOSIS — F329 Major depressive disorder, single episode, unspecified: Secondary | ICD-10-CM | POA: Diagnosis not present

## 2016-07-19 DIAGNOSIS — I482 Chronic atrial fibrillation, unspecified: Secondary | ICD-10-CM

## 2016-07-19 DIAGNOSIS — I10 Essential (primary) hypertension: Secondary | ICD-10-CM | POA: Diagnosis not present

## 2016-07-19 DIAGNOSIS — I259 Chronic ischemic heart disease, unspecified: Secondary | ICD-10-CM

## 2016-07-19 DIAGNOSIS — Z7901 Long term (current) use of anticoagulants: Secondary | ICD-10-CM | POA: Diagnosis not present

## 2016-07-19 DIAGNOSIS — F32A Depression, unspecified: Secondary | ICD-10-CM | POA: Insufficient documentation

## 2016-07-19 LAB — CBC WITH DIFFERENTIAL/PLATELET
BASOS ABS: 0 10*3/uL (ref 0.0–0.2)
BASOS: 0 %
EOS (ABSOLUTE): 0.2 10*3/uL (ref 0.0–0.4)
Eos: 3 %
Hematocrit: 40.3 % (ref 37.5–51.0)
Hemoglobin: 13.1 g/dL (ref 12.6–17.7)
IMMATURE GRANS (ABS): 0 10*3/uL (ref 0.0–0.1)
IMMATURE GRANULOCYTES: 0 %
LYMPHS: 32 %
Lymphocytes Absolute: 1.7 10*3/uL (ref 0.7–3.1)
MCH: 29.2 pg (ref 26.6–33.0)
MCHC: 32.5 g/dL (ref 31.5–35.7)
MCV: 90 fL (ref 79–97)
MONOS ABS: 0.4 10*3/uL (ref 0.1–0.9)
Monocytes: 8 %
NEUTROS PCT: 57 %
Neutrophils Absolute: 3.1 10*3/uL (ref 1.4–7.0)
PLATELETS: 230 10*3/uL (ref 150–379)
RBC: 4.48 x10E6/uL (ref 4.14–5.80)
RDW: 14.3 % (ref 12.3–15.4)
WBC: 5.5 10*3/uL (ref 3.4–10.8)

## 2016-07-19 MED ORDER — PANTOPRAZOLE SODIUM 40 MG PO TBEC: 40.0000 mg | DELAYED_RELEASE_TABLET | Freq: Two times a day (BID) | ORAL | 3 refills | Status: DC

## 2016-07-19 NOTE — Patient Instructions (Signed)
Great to see you!  Lets see him again in 3 months unless you need [place] sooner.   Ideally I would like his blood pressure 120 /60s-70s  Stop lisinopril

## 2016-07-19 NOTE — Progress Notes (Signed)
   HPI  Patient presents today here for routine follow-up of chronic medical conditions.  Patient has chronic atrial fibrillation on L [place], he denies any bleeding.  Hypertension Average at home as been 95 systolic over [id number] diastolic He denies any dizziness or lightheadedness. He denies any chest pain or shortness of breath.  His memory loss seems stable.  Depression He appears to be doing very well on Lexapro No GI upset, no side effects.  PMH: Smoking status noted ROS: Per HPI  Objective: BP (!) 98/55   Pulse 72   Temp (!) 96.6 F (35.9 C) (Oral)   Ht 5\' 8"  (1.727 m)   Wt 149 lb 12.8 oz (67.9 kg)   BMI 22.78 kg/m  Gen: NAD, alert, cooperative with exam HEENT: NCAT CV: RRR, good S1/S2, no murmur Resp: CTABL, no wheezes, non-labored Ext: No edema, warm Neuro: Alert and oriented, No gross deficits  Depression screen [facility] 2/9 07/19/2016 05/18/2016 04/17/2016 03/12/2016  Decreased Interest 0 2 1 0  Down, Depressed, Hopeless 0 0 0 0  PHQ - 2 Score 0 2 1 0  Altered sleeping - 0 - -  Tired, decreased energy - 2 - -  Change in appetite - 1 - -  Feeling bad or failure about yourself  - 2 - -  Trouble concentrating - 0 - -  Moving slowly or fidgety/restless - 0 - -  Suicidal thoughts - 0 - -  PHQ-9 Score - 7 - -     Assessment and plan:  # Hypertension Soft blood pressures continued, initial reading was 81 on automatic sphygmomanometer, recheck with manual was 98. Discontinue lisinopril Continue metoprolol, also used for rate control  # Atrial fibrillation, chronic anticoagulation, normocytic anemia Rate controlled Continue metoprolol Can transition to digoxin or Cardizem if needed. CBC  # Depression Sincerely doing well with Lexapro, continue     Orders Placed This Encounter  Procedures  . CBC with Differential    Meds ordered this encounter  Medications  . pantoprazole (PROTONIX) 40 MG tablet    Sig: Take 1 tablet (40 mg total) by mouth 2 (two) times  daily.    Dispense:  60 tablet    Refill:  [place], MD [place] 07/19/2016, 11:15 AM

## 2016-07-23 ENCOUNTER — Telehealth: Payer: Self-pay | Admitting: Cardiology

## 2016-07-23 DIAGNOSIS — Z79899 Other long term (current) drug therapy: Secondary | ICD-10-CM

## 2016-07-23 NOTE — Telephone Encounter (Signed)
Mailed lab slips to pt home PT FOR FU MADE BY RECEPTION

## 2016-07-23 NOTE — Telephone Encounter (Signed)
6 mth f/u with BMET & CBC dx: drug monitoring for eliquis

## 2016-07-24 DIAGNOSIS — R1013 Epigastric pain: Secondary | ICD-10-CM | POA: Diagnosis not present

## 2016-07-26 DIAGNOSIS — K921 Melena: Secondary | ICD-10-CM | POA: Diagnosis not present

## 2016-07-31 ENCOUNTER — Ambulatory Visit: Payer: Medicare Other

## 2016-08-08 ENCOUNTER — Encounter: Payer: Self-pay | Admitting: *Deleted

## 2016-08-28 ENCOUNTER — Ambulatory Visit (INDEPENDENT_AMBULATORY_CARE_PROVIDER_SITE_OTHER): Payer: Medicare Other

## 2016-08-28 DIAGNOSIS — Z79899 Other long term (current) drug therapy: Secondary | ICD-10-CM | POA: Diagnosis not present

## 2016-08-28 DIAGNOSIS — Z5181 Encounter for therapeutic drug level monitoring: Secondary | ICD-10-CM | POA: Diagnosis not present

## 2016-08-28 DIAGNOSIS — Z23 Encounter for immunization: Secondary | ICD-10-CM | POA: Diagnosis not present

## 2016-08-29 ENCOUNTER — Telehealth: Payer: Self-pay | Admitting: *Deleted

## 2016-08-29 NOTE — Telephone Encounter (Signed)
-----   Message from Satira Sark, MD sent at 08/28/2016  1:46 PM EDT ----- Results reviewed. Renal function remains normal and hemoglobin is normal. No change to current regimen. A copy of this test should be forwarded to Kenn File, MD.

## 2016-08-29 NOTE — Telephone Encounter (Signed)
Patient informed. Copy sent to PCP °

## 2016-09-12 NOTE — Progress Notes (Signed)
Cardiology Office Note  Date: 09/14/2016   ID: Henry Ayala, DOB Sep 10, 1931, MRN [id number]  PCP: Kenn File, MD  Primary Cardiologist: Rozann Lesches, MD   Chief Complaint  Patient presents with  . Coronary Artery Disease  . Atrial Fibrillation    History of Present Illness:  Henry Ayala is an 82 y.o. male last seen in April. He is here today with a family member for a follow-up visit. Overall no major change from a cardiac perspective. He denies angina symptoms, also no palpitations or dizziness. He has been taken off of ACE inhibitor due to relatively low blood pressures.  I reviewed his most recent lab work as outlined below. Current cardiac regimen includes Eliquis, Mevacor, and Lopressor.  He does not report any falls or bleeding problems. Remains functional with his ADLs.  He continues to follow with [facility] for primary care.  Past Medical History:  Diagnosis Date  . Anteroseptal myocardial infarction [facility])    PTCA to LAD 1992  . BPH (benign prostatic hyperplasia)   . CAD (coronary artery disease)    PTCA to LAD 1992, BMS to RCA 2004  . Chronic atrial fibrillation ([place])   . Essential hypertension   . GERD (gastroesophageal reflux disease)   . GI bleed    March 2017 - esophagitis  . Hiatal hernia   . Hyperlipidemia   . Ischemic cardiomyopathy    [name] 48% 2015  . Myocardial infarction, inferior wall ([name])    BMS to proximal RCA 2004 at [facility]  . Nephrolithiasis   . Sleep apnea    On CPAP    Past Surgical History:  Procedure Laterality Date  . CARDIOVERSION N/A 06/30/2014   Procedure: CARDIOVERSION;  Surgeon: Sanda Klein, MD;  Location: [facility] ENDOSCOPY;  Service: Cardiovascular;  Laterality: N/A;  . ESOPHAGOGASTRODUODENOSCOPY Left 02/14/2016   Procedure: ESOPHAGOGASTRODUODENOSCOPY (EGD);  Surgeon: Wonda Horner, MD;  Location: [facility] ENDOS[facility];  Service: Endoscopy;  Laterality: Left;  . TOTAL KNEE ARTHROPLASTY     right    Current Outpatient  Prescriptions  Medication Sig Dispense Refill  . alum & mag hydroxide-simeth (MAALOX/MYLANTA) 200-200-20 MG/5ML suspension Take 30 mLs by mouth every 6 (six) hours as needed for indigestion or heartburn. 355 mL 2  . apixaban (ELIQUIS) 5 MG TABS tablet Take 1 tablet (5 mg total) by mouth 2 (two) times daily. 60 tablet 10  . bismuth subsalicylate (PEPTO BISMOL) 262 MG/15ML suspension Take 30 mLs by mouth every 6 (six) hours as needed for diarrhea or loose stools.    . doxazosin (CARDURA) 4 MG tablet Take 1 tablet (4 mg total) by mouth daily. 90 tablet 3  . escitalopram (LEXAPRO) 5 MG tablet Take 1 tablet (5 mg total) by mouth daily. 30 tablet 3  . lovastatin (MEVACOR) 40 MG tablet Take 1 tablet (40 mg total) by mouth daily. 90 tablet 1  . metoCLOPramide (REGLAN) 5 MG tablet Take 1 tablet (5 mg total) by mouth every 8 (eight) hours as needed for nausea or vomiting. 30 tablet 0  . metoprolol tartrate (LOPRESSOR) 25 MG tablet TAKE ONE-HALF TABLET BY MOUTH ONCE DAILY OR TAKE AS DIRECTED 90 tablet 1  . pantoprazole (PROTONIX) 40 MG tablet Take 1 tablet (40 mg total) by mouth 2 (two) times daily. 60 tablet 3   No current facility-administered medications for this visit.    Allergies:  Iodinated diagnostic agents; Lipitor [atorvastatin calcium]; and Sulfa drugs cross reactors   Social History: The patient  reports that he  has never smoked. He has never used smokeless tobacco. He reports that he does not drink alcohol or use drugs.   ROS:  Please see the history of present illness. Otherwise, complete review of systems is positive for occasional indigestion symptoms.  All other systems are reviewed and negative.   Physical Exam: VS:  BP 102/80   Pulse 87   Ht 5\' 7"  (1.702 m)   Wt 158 lb (71.7 kg)   SpO2 97%   BMI 24.75 kg/m , BMI Body mass index is 24.75 kg/m.  Wt Readings from Last 3 Encounters:  09/14/16 158 lb (71.7 kg)  07/19/16 149 lb 12.8 oz (67.9 kg)  05/18/16 149 lb (67.6 kg)      General: Elderly male, appears comfortable at rest. HEENT: Conjunctiva and lids normal, oropharynx clear. Neck: Supple, no elevated JVP or carotid bruits, no thyromegaly. Lungs: Clear to auscultation, nonlabored breathing at rest. Cardiac: Irregularly irregular, no S3 or significant systolic murmur, no pericardial rub. Abdomen: Soft, nontender, bowel sounds present, no guarding or rebound. Extremities: No pitting edema, distal pulses 2+. Skin: Warm and dry. Musculoskeletal: Mild kyphosis. Neuropsychiatric: Alert and oriented x3, affect grossly appropriate.  ECG: I personally reviewed the tracing from 05/09/2016 which showed rate-controlled atrial fibrillation with IVCD.  Recent Labwork: 05/10/2016: Magnesium 2.3; TSH 0.703 05/11/2016: Hemoglobin 11.9 05/18/2016: ALT 12; AST 10; BUN 17; Creatinine, Ser 1.18; Potassium 3.9; Sodium 139 07/19/2016: Platelets 230     Component Value Date/Time   CHOL 129 12/16/2015 0931   TRIG 94 12/16/2015 0931   HDL 51 12/16/2015 0931   CHOLHDL 2.5 12/16/2015 0931   VLDL 19 12/16/2015 0931   LDLCALC 59 12/16/2015 0931    Other Studies Reviewed Today:  Echocardiogram 05/21/2014: Study Conclusions  - Left ventricle: The cavity size was mildly dilated. Wall thickness was normal. The estimated ejection fraction was 48% by Simpson&'s biplane. Mild inferior hypokinesis. The study is not technically sufficient to allow evaluation of LV diastolic function. - Mitral valve: Mildly thickened leaflets . There was mild regurgitation. - Left atrium: Severely dilated (68 ml/m2). - Atrial septum: No defect or patent foramen ovale was identified. - Tricuspid valve: There was trivial regurgitation. - Pulmonary arteries: PA peak pressure: 27 mm Hg (S).  Impressions:  - [name] 48% by Simpson's, mild inferior hypokinesis, severe LAE, mild MR, trace to mild TR, normal RVSP.  Assessment and Plan:  1. Symptomatically stable CAD status post PTCA of the LAD  in 1992 as well as BMS to the RCA in 2004. He does not report any angina symptoms and our plan is to continue conservative medical therapy and follow-up.  2. Ischemic cardiomyopathy with [name] 45%, appears euvolemic. Not on diuretic. He was taken off ACE inhibitor due to low blood pressures.  3. Hyperlipidemia, LDL 59 on statin therapy.  4. Chronic atrial fibrillation, continues on Eliquis without obvious recurrent GI bleeding. I reviewed his most recent lab work. Follow-up CBC and BMET for next visit.   Current medicines were reviewed with the patient today.  Disposition: Follow-up in 6 months.  Signed, Satira Sark, MD, [facility] 09/14/2016 11:27 AM    [facility] [place], [place], [place] [address] Phone: [phone]; Fax: [phone]

## 2016-09-14 ENCOUNTER — Ambulatory Visit (INDEPENDENT_AMBULATORY_CARE_PROVIDER_SITE_OTHER): Payer: Medicare Other | Admitting: Cardiology

## 2016-09-14 ENCOUNTER — Encounter: Payer: Self-pay | Admitting: Cardiology

## 2016-09-14 VITALS — BP 102/80 | HR 87 | Ht 67.0 in | Wt 158.0 lb

## 2016-09-14 DIAGNOSIS — E782 Mixed hyperlipidemia: Secondary | ICD-10-CM | POA: Diagnosis not present

## 2016-09-14 DIAGNOSIS — I482 Chronic atrial fibrillation, unspecified: Secondary | ICD-10-CM

## 2016-09-14 DIAGNOSIS — I2510 Atherosclerotic heart disease of native coronary artery without angina pectoris: Secondary | ICD-10-CM

## 2016-09-14 DIAGNOSIS — I259 Chronic ischemic heart disease, unspecified: Secondary | ICD-10-CM

## 2016-09-14 DIAGNOSIS — I255 Ischemic cardiomyopathy: Secondary | ICD-10-CM | POA: Diagnosis not present

## 2016-09-14 NOTE — Patient Instructions (Signed)
Your physician wants you to follow-up in: [place] DR. Domenic Polite You will receive a reminder letter in the mail two months in advance. If you don't receive a letter, please call our office to schedule the follow-up appointment.  Your physician recommends that you continue on your current medications as directed. Please refer to the Current Medication list given to you today.  Your physician recommends that you return for lab work IN 6 MONTHS JUST PRIOR TO YOUR NEXT VISIT   Thank you for choosing [facility]!!

## 2016-09-26 ENCOUNTER — Other Ambulatory Visit: Payer: Self-pay | Admitting: Family Medicine

## 2016-10-18 ENCOUNTER — Ambulatory Visit (INDEPENDENT_AMBULATORY_CARE_PROVIDER_SITE_OTHER): Payer: Medicare Other | Admitting: Family Medicine

## 2016-10-18 ENCOUNTER — Ambulatory Visit: Payer: Medicare Other | Admitting: Family Medicine

## 2016-10-18 ENCOUNTER — Encounter: Payer: Self-pay | Admitting: Family Medicine

## 2016-10-18 ENCOUNTER — Encounter (INDEPENDENT_AMBULATORY_CARE_PROVIDER_SITE_OTHER): Payer: Self-pay

## 2016-10-18 VITALS — BP 114/68 | HR 60 | Temp 95.3°F | Ht 67.0 in | Wt 163.4 lb

## 2016-10-18 DIAGNOSIS — I482 Chronic atrial fibrillation, unspecified: Secondary | ICD-10-CM

## 2016-10-18 DIAGNOSIS — R413 Other amnesia: Secondary | ICD-10-CM | POA: Diagnosis not present

## 2016-10-18 DIAGNOSIS — F32A Depression, unspecified: Secondary | ICD-10-CM

## 2016-10-18 DIAGNOSIS — I259 Chronic ischemic heart disease, unspecified: Secondary | ICD-10-CM

## 2016-10-18 DIAGNOSIS — F329 Major depressive disorder, single episode, unspecified: Secondary | ICD-10-CM | POA: Diagnosis not present

## 2016-10-18 NOTE — Progress Notes (Signed)
   HPI  Patient presents today here for a three-month follow-up of depression, memory loss, and atrial fibrillation.  Patient denies any complaints today. Memory loss appears to be stable. Depression is doing very well with the medication. Denies suicidal thoughts.  Denies any palpitations or chest pain. Also denies any sources of bleeding. Patient states that he entered the "doughnut hole" this month and had to pay for his blood thinner which was very expensive. As a result they would like to transition their prescriptions to a mail order pharmacy, they will contact their insurance carrier to find out where.  PMH: Smoking status noted ROS: Per HPI  Objective: BP 114/68   Pulse 60   Temp (!) 95.3 F (35.2 C) (Oral)   Ht 5\' 7"  (1.702 m)   Wt 163 lb 6.4 oz (74.1 kg)   BMI 25.59 kg/m  Gen: NAD, alert, cooperative with exam HEENT: NCAT, oropharynx clear, nares clear, TMs obscured by cerumen bilaterally CV: RRR, good S1/S2, no murmur Resp: CTABL, no wheezes, non-labored Ext: No edema, warm Neuro: Alert and oriented, No gross deficits  MMSE - Mini Mental State Exam 10/18/2016 04/17/2016  Orientation to time 3 4  Orientation to Place 5 4  Registration 3 3  Attention/ Calculation 3 3  Recall 2 1  Language- name 2 objects 2 2  Language- repeat 1 1  Language- follow 3 step command 3 3  Language- read & follow direction 1 1  Write a sentence 1 1  Copy design 1 1  Total score 25 24     Assessment and plan:  # Depression Doing very well on Lexapro, continue  # Memory loss Stable, MMSE slightly improved from 6 months ago to 25  # Chronic atrial fibrillation With anticoagulation, rate controlled No bleeding Continue beta blocker plus eliquis   Laroy Apple, MD [facility] 10/18/2016, 11:21 AM

## 2016-10-18 NOTE — Patient Instructions (Signed)
Great to see you!  Come back in 4 months unless you need us sooner.    

## 2016-10-19 ENCOUNTER — Ambulatory Visit: Payer: Medicare Other | Admitting: Family Medicine

## 2016-10-22 ENCOUNTER — Ambulatory Visit: Payer: Medicare Other | Admitting: Family Medicine

## 2016-12-13 ENCOUNTER — Ambulatory Visit (INDEPENDENT_AMBULATORY_CARE_PROVIDER_SITE_OTHER): Payer: Medicare Other | Admitting: Family Medicine

## 2016-12-13 ENCOUNTER — Ambulatory Visit (INDEPENDENT_AMBULATORY_CARE_PROVIDER_SITE_OTHER): Payer: Medicare Other

## 2016-12-13 ENCOUNTER — Encounter: Payer: Self-pay | Admitting: Family Medicine

## 2016-12-13 ENCOUNTER — Other Ambulatory Visit: Payer: Self-pay | Admitting: Family Medicine

## 2016-12-13 VITALS — BP 105/78 | HR 89 | Temp 96.7°F | Ht 67.0 in | Wt 161.6 lb

## 2016-12-13 DIAGNOSIS — J111 Influenza due to unidentified influenza virus with other respiratory manifestations: Secondary | ICD-10-CM | POA: Diagnosis not present

## 2016-12-13 DIAGNOSIS — R059 Cough, unspecified: Secondary | ICD-10-CM

## 2016-12-13 DIAGNOSIS — R05 Cough: Secondary | ICD-10-CM | POA: Diagnosis not present

## 2016-12-13 MED ORDER — OSELTAMIVIR PHOSPHATE 30 MG PO CAPS: 30.0000 mg | ORAL_CAPSULE | Freq: Two times a day (BID) | ORAL | 0 refills | Status: DC

## 2016-12-13 NOTE — Progress Notes (Addendum)
   HPI  Patient presents today here with cough and cold symptoms.  Patient has cough, weakness, and chest congestion started about 36 hours ago. His temperature has been measured at 100.0. He denies any shortness of breath, body aches, or difficulty tolerating foods and fluids.  He feels slightly better today, however he was in bed all day yesterday.  He denies any chest pain.  PMH: Smoking status noted ROS: Per HPI  Objective: BP 105/78   Pulse 89   Temp (!) 96.7 F (35.9 C) (Oral)   Ht 5\' 7"  (1.702 m)   Wt 161 lb 9.6 oz (73.3 kg)   BMI 25.31 kg/m  Gen: NAD, alert, cooperative with exam HEENT: NCAT, pharynx moist and clear, TMs obscured by cerumen bilaterally CV: irreg irreg, 3/6 systolic murmur Resp: CTABL, no wheezes, non-labored Ext: No edema, warm Neuro: Alert and oriented, No gross deficits  Based on recent labs his creatinine clearance ranges from 45-60  Chest x-ray with likely hiatal hernia splinting the gastric bubble on the right and elevated, otherwise appears clear and stable with previous chest x-rays  Assessment and plan:  # Influenza and cough Treat influenza with renally dosed Tamiflu X-ray rules out pneumonia, official radiology read is pending. Discussed supportive care and usual course of illness, very low threshold for follow-up if symptoms worsen      Orders Placed This Encounter  Procedures  . DG Chest 2 View    Standing Status:   Future    Standing Expiration Date:   02/10/2018    Order Specific Question:   Reason for Exam (SYMPTOM  OR DIAGNOSIS REQUIRED)    Answer:   COugh, Likely Flu but eval for underlying CAP    Order Specific Question:   Preferred imaging location?    Answer:   Internal    Meds ordered this encounter  Medications  . oseltamivir (TAMIFLU) 30 MG capsule    Sig: Take 1 capsule (30 mg total) by mouth 2 (two) times daily.    Dispense:  10 capsule    Refill:  0    Laroy Apple, MD [place] 12/13/2016, 10:42 AM

## 2016-12-13 NOTE — Patient Instructions (Addendum)
Great to see you!   Influenza, Adult Influenza, more commonly known as "the flu," is a viral infection that primarily affects the respiratory tract. The respiratory tract includes organs that help you breathe, such as the lungs, nose, and throat. The flu causes many common cold symptoms, as well as a high fever and body aches. The flu spreads easily from person to person (is contagious). Getting a flu shot (influenza vaccination) every year is the best way to prevent influenza. What are the causes? Influenza is caused by a virus. You can catch the virus by:  Breathing in droplets from an infected person's cough or sneeze.  Touching something that was recently contaminated with the virus and then touching your mouth, nose, or eyes. What increases the risk? The following factors may make you more likely to get the flu:  Not cleaning your hands frequently with soap and water or alcohol-based hand sanitizer.  Having close contact with many people during cold and flu season.  Touching your mouth, eyes, or nose without washing or sanitizing your hands first.  Not drinking enough fluids or not eating a healthy diet.  Not getting enough sleep or exercise.  Being under a high amount of stress.  Not getting a yearly (annual) flu shot. You may be at a higher risk of complications from the flu, such as a severe lung infection (pneumonia), if you:  Are over the age of 65.  Are pregnant.  Have a weakened disease-fighting system (immune system). You may have a weakened immune system if you:  Have HIV or AIDS.  Are undergoing chemotherapy.  Aretaking medicines that reduce the activity of (suppress) the immune system.  Have a long-term (chronic) illness, such as heart disease, kidney disease, diabetes, or lung disease.  Have a liver disorder.  Are obese.  Have anemia. What are the signs or symptoms? Symptoms of this condition typically last 4-10 days and may  include:  Fever.  Chills.  Headache, body aches, or muscle aches.  Sore throat.  Cough.  Runny or congested nose.  Chest discomfort and cough.  Poor appetite.  Weakness or tiredness (fatigue).  Dizziness.  Nausea or vomiting. How is this diagnosed? This condition may be diagnosed based on your medical history and a physical exam. Your health care provider may do a nose or throat swab test to confirm the diagnosis. How is this treated? If influenza is detected early, you can be treated with antiviral medicine that can reduce the length of your illness and the severity of your symptoms. This medicine may be given by mouth (orally) or through an IV tube that is inserted in one of your veins. The goal of treatment is to relieve symptoms by taking care of yourself at home. This may include taking over-the-counter medicines, drinking plenty of fluids, and adding humidity to the air in your home. In some cases, influenza goes away on its own. Severe influenza or complications from influenza may be treated in a hospital. Follow these instructions at home:  Take over-the-counter and prescription medicines only as told by your health care provider.  Use a cool mist humidifier to add humidity to the air in your home. This can make breathing easier.  Rest as needed.  Drink enough fluid to keep your urine clear or pale yellow.  Cover your mouth and nose when you cough or sneeze.  Wash your hands with soap and water often, especially after you cough or sneeze. If soap and water are not available, use   hand sanitizer.  Stay home from work or school as told by your health care provider. Unless you are visiting your health care provider, try to avoid leaving home until your fever has been gone for 24 hours without the use of medicine.  Keep all follow-up visits as told by your health care provider. This is important. How is this prevented?  Getting an annual flu shot is the best way to  avoid getting the flu. You may get the flu shot in late summer, fall, or winter. Ask your health care provider when you should get your flu shot.  Wash your hands often or use hand sanitizer often.  Avoid contact with people who are sick during cold and flu season.  Eat a healthy diet, drink plenty of fluids, get enough sleep, and exercise regularly. Contact a health care provider if:  You develop new symptoms.  You have:  Chest pain.  Diarrhea.  A fever.  Your cough gets worse.  You produce more mucus.  You feel nauseous or you vomit. Get help right away if:  You develop shortness of breath or difficulty breathing.  Your skin or nails turn a bluish color.  You have severe pain or stiffness in your neck.  You develop a sudden headache or sudden pain in your face or ear.  You cannot stop vomiting. This information is not intended to replace advice given to you by your health care provider. Make sure you discuss any questions you have with your health care provider. Document Released: 10/26/2000 Document Revised: 04/05/2016 Document Reviewed: 08/23/2015 Elsevier Interactive Patient Education  2017 [employer]vier Inc[employer]  

## 2016-12-20 ENCOUNTER — Other Ambulatory Visit: Payer: Self-pay | Admitting: Family Medicine

## 2017-01-25 ENCOUNTER — Ambulatory Visit (INDEPENDENT_AMBULATORY_CARE_PROVIDER_SITE_OTHER): Payer: Medicare Other | Admitting: Family Medicine

## 2017-01-25 ENCOUNTER — Encounter: Payer: Self-pay | Admitting: Family Medicine

## 2017-01-25 VITALS — BP 120/80 | HR 98 | Temp 94.3°F | Ht 67.0 in | Wt 163.8 lb

## 2017-01-25 DIAGNOSIS — D485 Neoplasm of uncertain behavior of skin: Secondary | ICD-10-CM | POA: Diagnosis not present

## 2017-01-25 DIAGNOSIS — E7800 Pure hypercholesterolemia, unspecified: Secondary | ICD-10-CM | POA: Diagnosis not present

## 2017-01-25 DIAGNOSIS — I482 Chronic atrial fibrillation, unspecified: Secondary | ICD-10-CM

## 2017-01-25 MED ORDER — PANTOPRAZOLE SODIUM 40 MG PO TBEC: 40.0000 mg | DELAYED_RELEASE_TABLET | Freq: Two times a day (BID) | ORAL | 3 refills | Status: DC

## 2017-01-25 MED ORDER — ESCITALOPRAM OXALATE 5 MG PO TABS: 5.0000 mg | ORAL_TABLET | Freq: Every day | ORAL | 3 refills | Status: DC

## 2017-01-25 MED ORDER — LOVASTATIN 40 MG PO TABS: 40.0000 mg | ORAL_TABLET | Freq: Every day | ORAL | 3 refills | Status: DC

## 2017-01-25 MED ORDER — DOXAZOSIN MESYLATE 4 MG PO TABS: 4.0000 mg | ORAL_TABLET | Freq: Every day | ORAL | 3 refills | Status: DC

## 2017-01-25 MED ORDER — APIXABAN 5 MG PO TABS: 5.0000 mg | ORAL_TABLET | Freq: Two times a day (BID) | ORAL | 3 refills | Status: DC

## 2017-01-25 NOTE — Progress Notes (Signed)
   HPI  Patient presents today her for discussion of skin lesion.  A fib, Needs refill of eliquis Has not seen any bleeding.   HLD Planning on coming back for labs, needs refill.  Good med compliance  Skin lesion Present for about 4 weeks Has had a small amount of bleeding on the pillow at night, irritated, located in front of L ear.    PMH: Smoking status noted ROS: Per HPI  Objective: BP 120/80   Pulse 98   Temp (!) 94.3 F (34.6 C) (Oral)   Ht '5\' 7"'$  (1.702 m)   Wt 163 lb 12.8 oz (74.3 kg)   BMI 25.65 kg/m  Gen: NAD, alert, cooperative with exam HEENT: NCAT CV: irregularly irreg Resp: CTABL, no wheezes, non-labored Ext: No edema, warm Neuro: Alert and oriented, No gross deficits  Skin:  Scaly heme crusted lesion anterior to L ear approx 1 cm X 1 cm.  No active bleeding, irregular border  Assessment and plan:  # New plasma uncertain behavior of skin Refer to dermatology given that he's on blood thinners as well as having a facial lesion. 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 No changes for now    Orders Placed This Encounter  Procedures  . Lipid panel    Standing Status:   Future    Standing Expiration Date:   01/25/2018  . CMP14+EGFR    Standing Status:   Future    Standing Expiration Date:   01/25/2018  . CBC with Differential/Platelet    Standing Status:   Future    Standing Expiration Date:   01/25/2018  . TSH    Standing Status:   Future    Standing Expiration Date:   01/25/2018  . Ambulatory referral to Dermatology    Referral Priority:   Routine    Referral Type:   Consultation    Referral Reason:   Specialty Services Required    Requested Specialty:   Dermatology    Number of Visits  Requested:   1    Meds ordered this encounter  Medications  . apixaban (ELIQUIS) 5 MG TABS tablet    Sig: Take 1 tablet (5 mg total) by mouth 2 (two) times daily.    Dispense:  180 tablet    Refill:  3  . doxazosin (CARDURA) 4 MG tablet    Sig: Take 1 tablet (4 mg total) by mouth daily.    Dispense:  90 tablet    Refill:  3  . escitalopram (LEXAPRO) 5 MG tablet    Sig: Take 1 tablet (5 mg total) by mouth daily.    Dispense:  90 tablet    Refill:  3    Please consider 90 day supplies to promote better adherence  . lovastatin (MEVACOR) 40 MG tablet    Sig: Take 1 tablet (40 mg total) by mouth daily.    Dispense:  90 tablet    Refill:  3  . pantoprazole (PROTONIX) 40 MG tablet    Sig: Take 1 tablet (40 mg total) by mouth 2 (two) times daily.    Dispense:  60 tablet    Refill:  [place], MD [place] 01/25/2017, 11:07 AM

## 2017-01-25 NOTE — Patient Instructions (Signed)
Great to see you!  Come back as scheduled, make an appointment for a lab visit at least 3 days before our visit.   You will be called for an appointment to see a dermatologist

## 2017-02-05 DIAGNOSIS — L570 Actinic keratosis: Secondary | ICD-10-CM | POA: Diagnosis not present

## 2017-02-05 DIAGNOSIS — Z08 Encounter for follow-up examination after completed treatment for malignant neoplasm: Secondary | ICD-10-CM | POA: Diagnosis not present

## 2017-02-05 DIAGNOSIS — Z85828 Personal history of other malignant neoplasm of skin: Secondary | ICD-10-CM | POA: Diagnosis not present

## 2017-02-05 DIAGNOSIS — X32XXXA Exposure to sunlight, initial encounter: Secondary | ICD-10-CM | POA: Diagnosis not present

## 2017-02-05 DIAGNOSIS — C44329 Squamous cell carcinoma of skin of other parts of face: Secondary | ICD-10-CM | POA: Diagnosis not present

## 2017-02-12 ENCOUNTER — Other Ambulatory Visit: Payer: Medicare Other

## 2017-02-12 DIAGNOSIS — I482 Chronic atrial fibrillation, unspecified: Secondary | ICD-10-CM

## 2017-02-12 DIAGNOSIS — E7800 Pure hypercholesterolemia, unspecified: Secondary | ICD-10-CM | POA: Diagnosis not present

## 2017-02-13 LAB — CMP14+EGFR
ALBUMIN: 4 g/dL (ref 3.5–4.7)
ALT: 9 IU/L (ref 0–44)
AST: 14 IU/L (ref 0–40)
Albumin/Globulin Ratio: 1.5 (ref 1.2–2.2)
Alkaline Phosphatase: 56 IU/L (ref 39–117)
BUN / CREAT RATIO: 13 (ref 10–24)
BUN: 14 mg/dL (ref 8–27)
Bilirubin Total: 0.5 mg/dL (ref 0.0–1.2)
CALCIUM: 9 mg/dL (ref 8.6–10.2)
CO2: 26 mmol/L (ref 18–29)
CREATININE: 1.12 mg/dL (ref 0.76–1.27)
Chloride: 100 mmol/L (ref 96–106)
GFR calc non Af Amer: 60 mL/min/{1.73_m2} (ref >59)
GFR, EST AFRICAN AMERICAN: 70 mL/min/{1.73_m2} (ref >59)
GLUCOSE: 89 mg/dL (ref 65–99)
Globulin, Total: 2.6 g/dL (ref 1.5–4.5)
Potassium: 4.2 mmol/L (ref 3.5–5.2)
Sodium: 141 mmol/L (ref 134–144)
TOTAL PROTEIN: 6.6 g/dL (ref 6.0–8.5)

## 2017-02-13 LAB — CBC WITH DIFFERENTIAL/PLATELET
Basophils Absolute: 0 10*3/uL (ref 0.0–0.2)
Basos: 0 %
EOS (ABSOLUTE): 0.1 10*3/uL (ref 0.0–0.4)
EOS: 2 %
HEMATOCRIT: 40.1 % (ref 37.5–51.0)
HEMOGLOBIN: 12.2 g/dL — AB (ref 13.0–17.7)
IMMATURE GRANS (ABS): 0 10*3/uL (ref 0.0–0.1)
Immature Granulocytes: 0 %
LYMPHS ABS: 2.2 10*3/uL (ref 0.7–3.1)
LYMPHS: 31 %
MCH: 24.9 pg — ABNORMAL LOW (ref 26.6–33.0)
MCHC: 30.4 g/dL — ABNORMAL LOW (ref 31.5–35.7)
MCV: 82 fL (ref 79–97)
MONOCYTES: 7 %
Monocytes Absolute: 0.5 10*3/uL (ref 0.1–0.9)
NEUTROS ABS: 4.2 10*3/uL (ref 1.4–7.0)
Neutrophils: 60 %
Platelets: 229 10*3/uL (ref 150–379)
RBC: 4.9 x10E6/uL (ref 4.14–5.80)
RDW: 16.5 % — AB (ref 12.3–15.4)
WBC: 7.1 10*3/uL (ref 3.4–10.8)

## 2017-02-13 LAB — LIPID PANEL
Chol/HDL Ratio: 2.5 ratio (ref 0.0–5.0)
Cholesterol, Total: 140 mg/dL (ref 100–199)
HDL: 56 mg/dL (ref >39)
LDL Calculated: 70 mg/dL (ref 0–99)
Triglycerides: 70 mg/dL (ref 0–149)
VLDL CHOLESTEROL CAL: 14 mg/dL (ref 5–40)

## 2017-02-13 LAB — TSH: TSH: 2.19 u[IU]/mL (ref 0.450–4.500)

## 2017-02-18 ENCOUNTER — Ambulatory Visit (INDEPENDENT_AMBULATORY_CARE_PROVIDER_SITE_OTHER): Payer: Medicare Other | Admitting: Family Medicine

## 2017-02-18 ENCOUNTER — Encounter: Payer: Self-pay | Admitting: Family Medicine

## 2017-02-18 VITALS — BP 130/65 | HR 72 | Temp 96.7°F | Ht 67.0 in | Wt 163.4 lb

## 2017-02-18 DIAGNOSIS — D649 Anemia, unspecified: Secondary | ICD-10-CM

## 2017-02-18 DIAGNOSIS — E7800 Pure hypercholesterolemia, unspecified: Secondary | ICD-10-CM

## 2017-02-18 DIAGNOSIS — I482 Chronic atrial fibrillation, unspecified: Secondary | ICD-10-CM

## 2017-02-18 NOTE — Patient Instructions (Signed)
Great to see you!  Call Dr. Johnnette Gourd office for an appointment to discuss his blood counts, be sure to let him know you are on a blood thinner.

## 2017-02-18 NOTE — Progress Notes (Signed)
   HPI  Patient presents today here for follow-up chronic medical conditions.  Patient has had recent labs, these were reviewed with him. Cholesterol is at goal for heart disease. Good medication compliance.  Atrial fibrillation, anemia Hemoglobin over 1 year is stable, however reduced over the last 2 checks. Patient has occasional small amount of fresh blood on the stool in the toilet Reasons to seek emergent medical care reviewed. His GI doctor for colonoscopy. Has history of peptic ulcer disease   PMH: Smoking status noted ROS: Per HPI  Objective: BP 130/65   Pulse 72   Temp (!) 96.7 F (35.9 C) (Oral)   Ht 5\' 7"  (1.702 m)   Wt 163 lb 6.4 oz (74.1 kg)   BMI 25.59 kg/m  Gen: NAD, alert, cooperative with exam HEENT: NCAT CV: RRR, good S1/S2, no murmur Resp: CTABL, no wheezes, non-labored Ext: No edema, warm Neuro: Alert and oriented, No gross deficits  Assessment and plan:  # Normocytic anemia Hemoglobin slipping slightly With eliquis discussed great caution with any bleeding PT will call GI for f/u  # Hypercholesterolemia On statin, at goal No changes Goal- LDL less than 70 with CAD  # A fib Rate controlled with BB On anticoag Has cardiology f/u next month.    Henry Apple, MD [facility] 02/18/2017, 12:14 PM

## 2017-03-12 NOTE — Progress Notes (Signed)
Cardiology Office Note  Date: 03/13/2017   ID: RAFIK KOPPEL, DOB 04-Dec-1930, MRN [id number]  PCP: Kenn File, MD  Primary Cardiologist: Rozann Lesches, MD   Chief Complaint  Patient presents with  . Coronary Artery Disease    History of Present Illness: Henry Ayala is an 83 y.o. male last seen in November 2017. He is here today for a routine follow-up visit. He does not report any obvious angina symptoms or palpitations on current medical regimen. Not overly active, but has started playing some golf with the improving weather.  I reviewed his medications which are stable from a cardiac perspective. He continues on Eliquis, Mevacor, and Lopressor. Recent lab work reviewed below.  I personally reviewed his ECG today which shows rate-controlled atrial fibrillation with incomplete left bundle-branch block, increased voltage, probable old inferior infarct pattern, nonspecific ST-T changes.  Past Medical History:  Diagnosis Date  . Anteroseptal myocardial infarction [facility])    PTCA to LAD 1992  . BPH (benign prostatic hyperplasia)   . CAD (coronary artery disease)    PTCA to LAD 1992, BMS to RCA 2004  . Chronic atrial fibrillation ([place])   . Essential hypertension   . GERD (gastroesophageal reflux disease)   . GI bleed    March 2017 - esophagitis  . Hiatal hernia   . Hyperlipidemia   . Ischemic cardiomyopathy    [name] 48% 2015  . Myocardial infarction, inferior wall ([name])    BMS to proximal RCA 2004 at [facility]  . Nephrolithiasis   . Sleep apnea    On CPAP    Past Surgical History:  Procedure Laterality Date  . CARDIOVERSION N/A 06/30/2014   Procedure: CARDIOVERSION;  Surgeon: Sanda Klein, MD;  Location: [facility] ENDOSCOPY;  Service: Cardiovascular;  Laterality: N/A;  . ESOPHAGOGASTRODUODENOSCOPY Left 02/14/2016   Procedure: ESOPHAGOGASTRODUODENOSCOPY (EGD);  Surgeon: Wonda Horner, MD;  Location: [facility];  Service: Endoscopy;  Laterality: Left;  . TOTAL KNEE  ARTHROPLASTY     right    Current Outpatient Prescriptions  Medication Sig Dispense Refill  . alum & mag hydroxide-simeth (MAALOX/MYLANTA) 200-200-20 MG/5ML suspension Take 30 mLs by mouth every 6 (six) hours as needed for indigestion or heartburn. 355 mL 2  . apixaban (ELIQUIS) 5 MG TABS tablet Take 1 tablet (5 mg total) by mouth 2 (two) times daily. 180 tablet 3  . bismuth subsalicylate (PEPTO BISMOL) 262 MG/15ML suspension Take 30 mLs by mouth every 6 (six) hours as needed for diarrhea or loose stools.    . doxazosin (CARDURA) 4 MG tablet Take 1 tablet (4 mg total) by mouth daily. 90 tablet 3  . escitalopram (LEXAPRO) 5 MG tablet Take 1 tablet (5 mg total) by mouth daily. 90 tablet 3  . lovastatin (MEVACOR) 40 MG tablet Take 1 tablet (40 mg total) by mouth daily. 90 tablet 3  . metoCLOPramide (REGLAN) 5 MG tablet Take 1 tablet (5 mg total) by mouth every 8 (eight) hours as needed for nausea or vomiting. 30 tablet 0  . metoprolol tartrate (LOPRESSOR) 25 MG tablet TAKE ONE-HALF TABLET BY MOUTH ONCE DAILY OR TAKE AS DIRECTED 90 tablet 1  . pantoprazole (PROTONIX) 40 MG tablet Take 1 tablet (40 mg total) by mouth 2 (two) times daily. 60 tablet 3   No current facility-administered medications for this visit.    Allergies:  Iodinated diagnostic agents; Lipitor [atorvastatin calcium]; and Sulfa drugs cross reactors   Social History: The patient  reports that he has never smoked. He  has never used smokeless tobacco. He reports that he does not drink alcohol or use drugs.   ROS:  Please see the history of present illness. Otherwise, complete review of systems is positive for hearing loss.  All other systems are reviewed and negative.   Physical Exam: VS:  BP 99/62   Ht 5\' 7"  (1.702 m)   Wt 162 lb (73.5 kg)   SpO2 98% Comment: on room air  BMI 25.37 kg/m , BMI Body mass index is 25.37 kg/m.  Wt Readings from Last 3 Encounters:  03/13/17 162 lb (73.5 kg)  02/18/17 163 lb 6.4 oz (74.1 kg)    01/25/17 163 lb 12.8 oz (74.3 kg)    General: Elderly male, appears comfortable at rest. HEENT: Conjunctiva and lids normal, oropharynx clear. Neck: Supple, no elevated JVP or carotid bruits, no thyromegaly. Lungs: Clear to auscultation, nonlabored breathing at rest. Cardiac: Irregularly irregular, no S3 or significant systolic murmur, no pericardial rub. Abdomen: Soft, nontender, bowel sounds present, no guarding or rebound. Extremities: No pitting edema, distal pulses 2+. Skin: Warm and dry. Musculoskeletal: Mild kyphosis. Neuropsychiatric: Alert and oriented x3, affect grossly appropriate.  ECG: I personally reviewed the tracing from 05/09/2016 which showed rate-controlled atrial fibrillation with possible old anterolateral infarct pattern and nonspecific ST changes.  Recent Labwork: 05/10/2016: Magnesium 2.3 05/11/2016: Hemoglobin 11.9 02/12/2017: ALT 9; AST 14; BUN 14; Creatinine, Ser 1.12; Platelets 229; Potassium 4.2; Sodium 141; TSH 2.190     Component Value Date/Time   CHOL 140 02/12/2017 0908   TRIG 70 02/12/2017 0908   HDL 56 02/12/2017 0908   CHOLHDL 2.5 02/12/2017 0908   CHOLHDL 2.5 12/16/2015 0931   VLDL 19 12/16/2015 0931   LDLCALC 70 02/12/2017 0908    Other Studies Reviewed Today:  Echocardiogram 05/21/2014: Study Conclusions  - Left ventricle: The cavity size was mildly dilated. Wall thickness was normal. The estimated ejection fraction was 48% by Simpson&'s biplane. Mild inferior hypokinesis. The study is not technically sufficient to allow evaluation of LV diastolic function. - Mitral valve: Mildly thickened leaflets . There was mild regurgitation. - Left atrium: Severely dilated (68 ml/m2). - Atrial septum: No defect or patent foramen ovale was identified. - Tricuspid valve: There was trivial regurgitation. - Pulmonary arteries: PA peak pressure: 27 mm Hg (S).  Impressions:  - [name] 48% by Simpson's, mild inferior hypokinesis, severe  LAE, mild MR, trace to mild TR, normal RVSP.  Assessment and Plan:  1. CAD status post angioplasty of the LAD in 1992 with BMS to the RCA in 2004. He remains clinically stable without angina on medical therapy and we will continue with conservative follow-up for now. ECG reviewed.  2. Ischemic cardiomyopathy with mildly reduced [name], no evidence of volume overload on examination. Relatively low blood pressure limits further up titration of medical therapy. He is not on standing diuretic at this time.  3. Hyperlipidemia, continues on Mevacor, recent LDL 70.  4. Chronic atrial fibrillation, no palpitations reported. Heart rate control is adequate. He is tolerating Eliquis without obvious GI bleeding. Recent lab work noted above.  Current medicines were reviewed with the patient today.   Orders Placed This Encounter  Procedures  . EKG 12-Lead    Disposition: Follow-up in 6 months.  Signed, Satira Sark, MD, [facility] 03/13/2017 10:57 AM    [facility], [place], [place] [address] Phone: [phone]; Fax: [phone]

## 2017-03-13 ENCOUNTER — Encounter: Payer: Self-pay | Admitting: Cardiology

## 2017-03-13 ENCOUNTER — Ambulatory Visit (INDEPENDENT_AMBULATORY_CARE_PROVIDER_SITE_OTHER): Payer: Medicare Other | Admitting: Cardiology

## 2017-03-13 VITALS — BP 99/62 | Ht 67.0 in | Wt 162.0 lb

## 2017-03-13 DIAGNOSIS — I255 Ischemic cardiomyopathy: Secondary | ICD-10-CM | POA: Diagnosis not present

## 2017-03-13 DIAGNOSIS — I2510 Atherosclerotic heart disease of native coronary artery without angina pectoris: Secondary | ICD-10-CM | POA: Diagnosis not present

## 2017-03-13 DIAGNOSIS — I482 Chronic atrial fibrillation, unspecified: Secondary | ICD-10-CM

## 2017-03-13 DIAGNOSIS — E782 Mixed hyperlipidemia: Secondary | ICD-10-CM

## 2017-03-13 NOTE — Patient Instructions (Signed)

## 2017-03-19 DIAGNOSIS — Z08 Encounter for follow-up examination after completed treatment for malignant neoplasm: Secondary | ICD-10-CM | POA: Diagnosis not present

## 2017-03-19 DIAGNOSIS — Z85828 Personal history of other malignant neoplasm of skin: Secondary | ICD-10-CM | POA: Diagnosis not present

## 2017-03-26 DIAGNOSIS — Z7901 Long term (current) use of anticoagulants: Secondary | ICD-10-CM | POA: Diagnosis not present

## 2017-03-26 DIAGNOSIS — Z86010 Personal history of colonic polyps: Secondary | ICD-10-CM | POA: Diagnosis not present

## 2017-03-26 DIAGNOSIS — I482 Chronic atrial fibrillation: Secondary | ICD-10-CM | POA: Diagnosis not present

## 2017-03-26 DIAGNOSIS — K219 Gastro-esophageal reflux disease without esophagitis: Secondary | ICD-10-CM | POA: Diagnosis not present

## 2017-05-27 DIAGNOSIS — Z86010 Personal history of colonic polyps: Secondary | ICD-10-CM | POA: Diagnosis not present

## 2017-06-20 ENCOUNTER — Encounter: Payer: Self-pay | Admitting: Family Medicine

## 2017-06-20 ENCOUNTER — Ambulatory Visit (INDEPENDENT_AMBULATORY_CARE_PROVIDER_SITE_OTHER): Payer: Medicare Other | Admitting: Family Medicine

## 2017-06-20 VITALS — BP 109/66 | HR 51 | Temp 96.8°F | Ht 67.0 in | Wt 164.4 lb

## 2017-06-20 DIAGNOSIS — I482 Chronic atrial fibrillation, unspecified: Secondary | ICD-10-CM

## 2017-06-20 DIAGNOSIS — D649 Anemia, unspecified: Secondary | ICD-10-CM | POA: Diagnosis not present

## 2017-06-20 DIAGNOSIS — I255 Ischemic cardiomyopathy: Secondary | ICD-10-CM

## 2017-06-20 DIAGNOSIS — I10 Essential (primary) hypertension: Secondary | ICD-10-CM

## 2017-06-20 NOTE — Progress Notes (Signed)
   HPI  Patient presents today here for follow-up chronic medical conditions.  Patient denies any side effects from medications or concerns. Hypertension Patient has good medication compliance, no chest pain. No leg swelling.  A fib No worrisome palpitations. No bleeding. Good medication compliance with anticoagulant  States months ago noticed a small amount of blood in stool, has resolved and FOBt negative with GI recently.   PMH: Smoking status noted ROS: Per HPI  Objective: BP 109/66   Pulse (!) 51   Temp (!) 96.8 F (36 C) (Oral)   Ht 5\' 7"  (1.702 m)   Wt 164 lb 6.4 oz (74.6 kg)   BMI 25.75 kg/m  Gen: NAD, alert, cooperative with exam HEENT: NCAT, EOMI, PERRL CV: irregularly irregular, regular rate Resp: CTABL, no wheezes, non-labored Ext: No edema, warm Neuro: Alert and oriented, No gross deficits  Assessment and plan:  # Atrial fibrillation Exam today shows rate controlled A. fib. Continue beta blocker and anticoagulant No changes  # Normocytic anemia Mild, stable over time No recent signs of bleeding, with small amount of stool and blood a few months ago I recommended a very low threshold for follow-up or follow-up with GI. Continue eliquis CBC, ferritin  # HTN  Very well controlled with current meds, continue BB, also on Alpha blocker for BPH which is likely affecting BP as well.  No changes    Orders Placed This Encounter  Procedures  . CBC with Differential/Platelet  . [place], MD [facility] 06/20/2017, 11:44 AM

## 2017-06-20 NOTE — Patient Instructions (Signed)
Great to see you!  Come back in 4 months unless you need us sooner.    

## 2017-06-21 LAB — CBC WITH DIFFERENTIAL/PLATELET
BASOS: 0 %
Basophils Absolute: 0 10*3/uL (ref 0.0–0.2)
EOS (ABSOLUTE): 0.2 10*3/uL (ref 0.0–0.4)
EOS: 3 %
HEMATOCRIT: 41.6 % (ref 37.5–51.0)
Hemoglobin: 12.8 g/dL — ABNORMAL LOW (ref 13.0–17.7)
Immature Grans (Abs): 0 10*3/uL (ref 0.0–0.1)
Immature Granulocytes: 0 %
Lymphocytes Absolute: 1.4 10*3/uL (ref 0.7–3.1)
Lymphs: 23 %
MCH: 25 pg — AB (ref 26.6–33.0)
MCHC: 30.8 g/dL — ABNORMAL LOW (ref 31.5–35.7)
MCV: 81 fL (ref 79–97)
MONOS ABS: 0.4 10*3/uL (ref 0.1–0.9)
Monocytes: 7 %
NEUTROS ABS: 4.1 10*3/uL (ref 1.4–7.0)
Neutrophils: 67 %
PLATELETS: 220 10*3/uL (ref 150–379)
RBC: 5.11 x10E6/uL (ref 4.14–5.80)
RDW: 16.5 % — ABNORMAL HIGH (ref 12.3–15.4)
WBC: 6.1 10*3/uL (ref 3.4–10.8)

## 2017-06-21 LAB — FERRITIN: Ferritin: 12 ng/mL — ABNORMAL LOW (ref 30–400)

## 2017-06-24 ENCOUNTER — Other Ambulatory Visit: Payer: Self-pay | Admitting: Family Medicine

## 2017-06-24 MED ORDER — FERROUS SULFATE 325 (65 FE) MG PO TABS: 325.0000 mg | ORAL_TABLET | Freq: Two times a day (BID) | ORAL | 2 refills | Status: DC

## 2017-07-17 ENCOUNTER — Other Ambulatory Visit: Payer: Self-pay | Admitting: Family Medicine

## 2017-07-30 ENCOUNTER — Encounter: Payer: Self-pay | Admitting: Family Medicine

## 2017-07-30 ENCOUNTER — Ambulatory Visit (INDEPENDENT_AMBULATORY_CARE_PROVIDER_SITE_OTHER): Payer: Medicare Other | Admitting: Family Medicine

## 2017-07-30 ENCOUNTER — Ambulatory Visit (INDEPENDENT_AMBULATORY_CARE_PROVIDER_SITE_OTHER): Payer: Medicare Other

## 2017-07-30 VITALS — BP 106/63 | HR 90 | Temp 96.8°F | Ht 67.0 in | Wt 171.0 lb

## 2017-07-30 DIAGNOSIS — K449 Diaphragmatic hernia without obstruction or gangrene: Secondary | ICD-10-CM | POA: Diagnosis not present

## 2017-07-30 DIAGNOSIS — R0602 Shortness of breath: Secondary | ICD-10-CM | POA: Diagnosis not present

## 2017-07-30 DIAGNOSIS — I482 Chronic atrial fibrillation, unspecified: Secondary | ICD-10-CM

## 2017-07-30 DIAGNOSIS — R0600 Dyspnea, unspecified: Secondary | ICD-10-CM

## 2017-07-30 DIAGNOSIS — I255 Ischemic cardiomyopathy: Secondary | ICD-10-CM

## 2017-07-30 NOTE — Progress Notes (Signed)
Subjective:  Patient ID: Henry Ayala, male    DOB: [birth year]  Age: 83 y.o. MRN: [id number]  CC: Fatigue (pt here today c/o not sleeping well at night and feels weak and has "heavy breathing when up and moving")   HPI Henry Ayala presents for difficulty breathing when he lays down. Not sleeping well. Awakens multiple times nightly. Breathing heavily- brought on by exertion. Limited tolerance for exertion. Denies pain. Onset over several weeks to months gradually worsening.  Depression screen [facility] 2/9 06/20/2017 02/18/2017 01/25/2017  Decreased Interest 0 0 0  Down, Depressed, Hopeless 0 0 0  PHQ - 2 Score 0 0 0  Altered sleeping - - -  Tired, decreased energy - - -  Change in appetite - - -  Feeling bad or failure about yourself  - - -  Trouble concentrating - - -  Moving slowly or fidgety/restless - - -  Suicidal thoughts - - -  PHQ-9 Score - - -    History Henry Ayala has a past medical history of Anteroseptal myocardial infarction ([place]); BPH (benign prostatic hyperplasia); CAD (coronary artery disease); Chronic atrial fibrillation ([place]); Essential hypertension; GERD (gastroesophageal reflux disease); GI bleed; Hiatal hernia; Hyperlipidemia; Ischemic cardiomyopathy; Myocardial infarction, inferior wall ([place]); Nephrolithiasis; and Sleep apnea.   He has a past surgical history that includes Total knee arthroplasty; Cardioversion (N/A, 06/30/2014); and Esophagogastroduodenoscopy (Left, 02/14/2016).   His family history includes Colon cancer in his sister; Coronary artery disease in his brother; Heart attack in his brother and father; Stroke in his mother.He reports that he has never smoked. He has never used smokeless tobacco. He reports that he does not drink alcohol or use drugs.    ROS Review of Systems  Constitutional: Negative for chills, diaphoresis, fever and unexpected weight change.  HENT: Negative for congestion, hearing loss, rhinorrhea and sore throat.   Eyes: Negative  for visual disturbance.  Respiratory: Positive for shortness of breath. Negative for cough.   Cardiovascular: Negative for chest pain.  Gastrointestinal: Negative for abdominal pain, constipation and diarrhea.  Genitourinary: Negative for dysuria and flank pain.  Musculoskeletal: Negative for arthralgias and joint swelling.  Skin: Negative for rash.  Neurological: Negative for dizziness and headaches.  Psychiatric/Behavioral: Negative for dysphoric mood and sleep disturbance.    Objective:  BP 106/63   Pulse 90   Temp (!) 96.8 F (36 C) (Oral)   Ht _0  (1.702 m)   Wt 171 lb (77.6 kg)   BMI 26.78 kg/m   BP Readings from Last 3 Encounters:  07/30/17 106/63  06/20/17 109/66  03/13/17 99/62    Wt Readings from Last 3 Encounters:  07/30/17 171 lb (77.6 kg)  06/20/17 164 lb 6.4 oz (74.6 kg)  03/13/17 162 lb (73.5 kg)     Physical Exam  Constitutional: He is oriented to person, place, and time. He appears well-developed and well-nourished. No distress.  HENT:  Head: Normocephalic and atraumatic.  Right Ear: External ear normal.  Left Ear: External ear normal.  Nose: Nose normal.  Mouth/Throat: Oropharynx is clear and moist.  Eyes: Pupils are equal, round, and reactive to light. Conjunctivae and EOM are normal.  Neck: Normal range of motion. Neck supple. No thyromegaly present.  Cardiovascular: Normal rate, regular rhythm and normal heart sounds.   No murmur heard. Pulmonary/Chest: Effort normal and breath sounds normal. No respiratory distress. He has no wheezes. He has no rales.  Abdominal: Soft. Bowel sounds are normal. He exhibits no distension. There is  no tenderness.  Lymphadenopathy:    He has no cervical adenopathy.  Neurological: He is alert and oriented to person, place, and time. He has normal reflexes.  Skin: Skin is warm and dry.  Psychiatric: He has a normal mood and affect. His behavior is normal. Judgment and thought content normal.    EKG - a. Fib  with controlled response. ST elevations - present on previous tracing. CXR - lg hiatal hernia PFT - severe restriction  Assessment & Plan:   Henry Ayala was seen today for fatigue.  Diagnoses and all orders for this visit:  SOB (shortness of breath) -     EKG 12-Lead -     PR BREATHING CAPACITY TEST  Dyspnea, unspecified type -     DG Chest 2 View; Future -     D-dimer, quantitative (not at [facility]) -     Brain natriuretic peptide -     CBC with Differential/Platelet -     CMP14+EGFR  Hiatal hernia  Chronic atrial fibrillation ([place])       I have discontinued Mr. Azam ferrous sulfate. I am also having him maintain his bismuth subsalicylate, alum & mag hydroxide-simeth, apixaban, doxazosin, escitalopram, lovastatin, pantoprazole, metoprolol tartrate, and multivitamin with minerals.  Allergies as of 07/30/2017      Reactions   Iodinated Diagnostic Agents    unknown   Lipitor [atorvastatin Calcium]    unknown   Sulfa Drugs Cross Reactors    unknown      Medication List       Accurate as of 07/30/17 10:28 PM. Always use your most recent med list.          alum & mag hydroxide-simeth 200-200-20 MG/5ML suspension Commonly known as:  MAALOX/MYLANTA Take 30 mLs by mouth every 6 (six) hours as needed for indigestion or heartburn.   apixaban 5 MG Tabs tablet Commonly known as:  ELIQUIS Take 1 tablet (5 mg total) by mouth 2 (two) times daily.   bismuth subsalicylate 161 WR/60AV suspension Commonly known as:  PEPTO BISMOL Take 30 mLs by mouth every 6 (six) hours as needed for diarrhea or loose stools.   doxazosin 4 MG tablet Commonly known as:  CARDURA Take 1 tablet (4 mg total) by mouth daily.   escitalopram 5 MG tablet Commonly known as:  LEXAPRO Take 1 tablet (5 mg total) by mouth daily.   lovastatin 40 MG tablet Commonly known as:  MEVACOR Take 1 tablet (40 mg total) by mouth daily.   metoprolol tartrate 25 MG tablet Commonly known as:  LOPRESSOR TAKE  ONE-HALF TABLET BY MOUTH ONCE DAILY OR TAKE AS DIRECTED   multivitamin with minerals tablet Take 1 tablet by mouth daily.   pantoprazole 40 MG tablet Commonly known as:  PROTONIX Take 1 tablet (40 mg total) by mouth 2 (two) times daily.            Discharge Care Instructions        Start     Ordered   07/30/17 0000  EKG 12-Lead     07/30/17 1109   07/30/17 0000  PR BREATHING CAPACITY TEST     07/30/17 1109   07/30/17 0000  DG Chest 2 View    Question Answer Comment  Reason for Exam (SYMPTOM  OR DIAGNOSIS REQUIRED) Dyspnea   Preferred imaging location? Internal      07/30/17 1127   07/30/17 0000  D-dimer, quantitative (not at [facility])     07/30/17 1130   07/30/17 0000  Brain  natriuretic peptide     07/30/17 1130   07/30/17 0000  CBC with Differential/Platelet     07/30/17 1131   07/30/17 0000  CMP14+EGFR     07/30/17 1131       Follow-up: Return in about 3 months (around 10/29/2017) for with Dr. Wendi Snipes.  Claretta Fraise, M.D.

## 2017-07-31 ENCOUNTER — Telehealth: Payer: Self-pay

## 2017-07-31 ENCOUNTER — Other Ambulatory Visit: Payer: Self-pay | Admitting: Family Medicine

## 2017-07-31 ENCOUNTER — Telehealth: Payer: Self-pay | Admitting: Cardiology

## 2017-07-31 DIAGNOSIS — I2510 Atherosclerotic heart disease of native coronary artery without angina pectoris: Secondary | ICD-10-CM

## 2017-07-31 LAB — CBC WITH DIFFERENTIAL/PLATELET
BASOS: 1 %
Basophils Absolute: 0 10*3/uL (ref 0.0–0.2)
EOS (ABSOLUTE): 0.1 10*3/uL (ref 0.0–0.4)
Eos: 2 %
HEMATOCRIT: 43.1 % (ref 37.5–51.0)
HEMOGLOBIN: 13.3 g/dL (ref 13.0–17.7)
Immature Grans (Abs): 0 10*3/uL (ref 0.0–0.1)
Immature Granulocytes: 0 %
LYMPHS ABS: 1.5 10*3/uL (ref 0.7–3.1)
Lymphs: 23 %
MCH: 25.4 pg — ABNORMAL LOW (ref 26.6–33.0)
MCHC: 30.9 g/dL — AB (ref 31.5–35.7)
MCV: 82 fL (ref 79–97)
MONOS ABS: 0.5 10*3/uL (ref 0.1–0.9)
Monocytes: 7 %
NEUTROS ABS: 4.5 10*3/uL (ref 1.4–7.0)
NEUTROS PCT: 67 %
Platelets: 212 10*3/uL (ref 150–379)
RBC: 5.23 x10E6/uL (ref 4.14–5.80)
RDW: 17.8 % — AB (ref 12.3–15.4)
WBC: 6.7 10*3/uL (ref 3.4–10.8)

## 2017-07-31 LAB — CMP14+EGFR
ALT: 19 IU/L (ref 0–44)
AST: 18 IU/L (ref 0–40)
Albumin/Globulin Ratio: 1.6 (ref 1.2–2.2)
Albumin: 4.2 g/dL (ref 3.5–4.7)
Alkaline Phosphatase: 58 IU/L (ref 39–117)
BUN/Creatinine Ratio: 9 — ABNORMAL LOW (ref 10–24)
BUN: 11 mg/dL (ref 8–27)
Bilirubin Total: 0.5 mg/dL (ref 0.0–1.2)
CHLORIDE: 101 mmol/L (ref 96–106)
CO2: 29 mmol/L (ref 20–29)
Calcium: 9.4 mg/dL (ref 8.6–10.2)
Creatinine, Ser: 1.19 mg/dL (ref 0.76–1.27)
GFR, EST AFRICAN AMERICAN: 64 mL/min/{1.73_m2} (ref >59)
GFR, EST NON AFRICAN AMERICAN: 56 mL/min/{1.73_m2} — AB (ref >59)
Globulin, Total: 2.6 g/dL (ref 1.5–4.5)
Glucose: 85 mg/dL (ref 65–99)
Potassium: 4.5 mmol/L (ref 3.5–5.2)
Sodium: 142 mmol/L (ref 134–144)
Total Protein: 6.8 g/dL (ref 6.0–8.5)

## 2017-07-31 LAB — D-DIMER, QUANTITATIVE (NOT AT ARMC): D-DIMER: 0.4 mg{FEU}/L (ref 0.00–0.49)

## 2017-07-31 LAB — BRAIN NATRIURETIC PEPTIDE: BNP: 438.2 pg/mL — AB (ref 0.0–100.0)

## 2017-07-31 MED ORDER — SPIRONOLACTONE-HCTZ 25-25 MG PO TABS: 1.0000 | ORAL_TABLET | Freq: Every day | ORAL | 1 refills | Status: DC

## 2017-07-31 NOTE — Telephone Encounter (Signed)
Per phone call from pt's care giver Cristela Blue [phone] --pt started on spironolactone from breathing Dr. --would like to know if he should go ahead and start it when they pick it up.

## 2017-07-31 NOTE — Telephone Encounter (Signed)
Son is wanting Dr. Wendi Snipes to review the labs from 07/30/17. Was placed on aldactazide and stay on it until he next see's [place]. Next follow up is in December- wanting to know if they should come back before then to see Wendi Snipes or is it okay to wait ill December? Please advise

## 2017-08-01 MED ORDER — SPIRONOLACTONE 25 MG PO TABS: 25.0000 mg | ORAL_TABLET | Freq: Every day | ORAL | 3 refills | Status: DC

## 2017-08-01 NOTE — Telephone Encounter (Signed)
Patient complained of heavy breathing and unable to sleep. Caregiver took patient to PCP. Dr. Livia Snellen saw patient, did blood work and prescribed aldactazide 25-25 mg. Caregiver just wanted to make sure Dr. Domenic Polite was ok with patient starting this.

## 2017-08-01 NOTE — Telephone Encounter (Signed)
[place] pt, will forward

## 2017-08-01 NOTE — Telephone Encounter (Signed)
Called his son to discuss  Will proceed with plan spironolactone 25 mg , new rx sent and pharmacy notified Explained frequent lab monitoring with starting spironon, BMP in 3 days, 1 week, and 1 month for 3 months.   Labs on Monday, son is agreeable, Repeat Friday ( pt will start med tomorrow)  Laroy Apple, MD [facility] 08/01/2017, 12:17 PM

## 2017-08-01 NOTE — Addendum Note (Signed)
Addended by: Timmothy Euler on: 08/01/2017 12:19 PM   Modules accepted: Orders

## 2017-08-01 NOTE — Telephone Encounter (Signed)
Patients caregiver tara notified and verbalized understanding

## 2017-08-01 NOTE — Telephone Encounter (Signed)
Not unreasonable to start Aldactazide. They should keep up with PCP in terms of how well he tolerates the medication and also whether he needs any follow-up lab work.

## 2017-08-02 ENCOUNTER — Encounter: Payer: Self-pay | Admitting: *Deleted

## 2017-08-05 ENCOUNTER — Other Ambulatory Visit: Payer: Medicare Other

## 2017-08-05 DIAGNOSIS — I2510 Atherosclerotic heart disease of native coronary artery without angina pectoris: Secondary | ICD-10-CM

## 2017-08-06 LAB — BMP8+EGFR
BUN/Creatinine Ratio: 13 (ref 10–24)
BUN: 17 mg/dL (ref 8–27)
CALCIUM: 9.7 mg/dL (ref 8.6–10.2)
CO2: 27 mmol/L (ref 20–29)
CREATININE: 1.28 mg/dL — AB (ref 0.76–1.27)
Chloride: 103 mmol/L (ref 96–106)
GFR calc Af Amer: 59 mL/min/{1.73_m2} — ABNORMAL LOW (ref >59)
GFR, EST NON AFRICAN AMERICAN: 51 mL/min/{1.73_m2} — AB (ref >59)
Glucose: 104 mg/dL — ABNORMAL HIGH (ref 65–99)
Potassium: 5 mmol/L (ref 3.5–5.2)
SODIUM: 145 mmol/L — AB (ref 134–144)

## 2017-08-09 NOTE — Progress Notes (Signed)
Cardiology Office Note  Date: 08/12/2017   ID: Henry Ayala, DOB [birth year], MRN [id number]  PCP: Timmothy Euler, MD  Primary Cardiologist: Rozann Lesches, MD   Chief Complaint  Patient presents with  . Coronary Artery Disease  . Cardiomyopathy    History of Present Illness: Henry Ayala is an 83 y.o. male last seen in May. He saw Dr. Livia Snellen recently complaining of dyspnea on exertion as well as orthopnea. Lab work was obtained and he was ultimately placed on Aldactone. He is here today with his son and daughter for a follow-up visit. He himself does not report any major symptomatology, denies substantial dyspnea on exertion, no angina symptoms. His son mentions that he and his sister both noticed their father being more short of breath when he was outside picking up sticks after the recent storm. The patient and also complained of his pants not fitting well due to relative protuberance of his abdomen. His weight has come down compared to the recent weight with PCP since initiation of Aldactone. He has insomnia, does not use CPAP, but does not indicate any obvious orthopnea or PND. No leg swelling.  Echocardiogram from 2015 is reviewed below. [name] was in the 45-50% range at that time with inferior wall motion abnormality consistent with previous infarct. Today we discussed obtaining a follow-up echocardiogram to reassess [name]. We also discussed arranging a Lexiscan Myoview to assess ischemic burden as this could also be a contributor to dyspnea on exertion.  I went over the patient's recent lab work which is outlined below. He does have mild renal insufficiency with potassium 5.0. This will need to be followed up, particular since he is on Aldactone.  Past Medical History:  Diagnosis Date  . Anteroseptal myocardial infarction [facility])    PTCA to LAD 1992  . BPH (benign prostatic hyperplasia)   . CAD (coronary artery disease)    PTCA to LAD 1992, BMS to RCA 2004  . Chronic  atrial fibrillation ([place])   . Essential hypertension   . GERD (gastroesophageal reflux disease)   . GI bleed    March 2017 - esophagitis  . Hiatal hernia   . Hyperlipidemia   . Ischemic cardiomyopathy    [name] 48% 2015  . Myocardial infarction, inferior wall ([name])    BMS to proximal RCA 2004 at [facility]  . Nephrolithiasis   . Sleep apnea    On CPAP    Past Surgical History:  Procedure Laterality Date  . CARDIOVERSION N/A 06/30/2014   Procedure: CARDIOVERSION;  Surgeon: Sanda Klein, MD;  Location: [facility] ENDOSCOPY;  Service: Cardiovascular;  Laterality: N/A;  . ESOPHAGOGASTRODUODENOSCOPY Left 02/14/2016   Procedure: ESOPHAGOGASTRODUODENOSCOPY (EGD);  Surgeon: Wonda Horner, MD;  Location: [facility];  Service: Endoscopy;  Laterality: Left;  . TOTAL KNEE ARTHROPLASTY     right    Current Outpatient Prescriptions  Medication Sig Dispense Refill  . alum & mag hydroxide-simeth (MAALOX/MYLANTA) 200-200-20 MG/5ML suspension Take 30 mLs by mouth every 6 (six) hours as needed for indigestion or heartburn. 355 mL 2  . apixaban (ELIQUIS) 5 MG TABS tablet Take 1 tablet (5 mg total) by mouth 2 (two) times daily. 180 tablet 3  . bismuth subsalicylate (PEPTO BISMOL) 262 MG/15ML suspension Take 30 mLs by mouth every 6 (six) hours as needed for diarrhea or loose stools.    . doxazosin (CARDURA) 4 MG tablet Take 1 tablet (4 mg total) by mouth daily. 90 tablet 3  . escitalopram (LEXAPRO) 5 MG  tablet Take 1 tablet (5 mg total) by mouth daily. 90 tablet 3  . lovastatin (MEVACOR) 40 MG tablet Take 1 tablet (40 mg total) by mouth daily. 90 tablet 3  . metoprolol tartrate (LOPRESSOR) 25 MG tablet TAKE ONE-HALF TABLET BY MOUTH ONCE DAILY OR TAKE AS DIRECTED 90 tablet 1  . Multiple Vitamins-Minerals (MULTIVITAMIN WITH MINERALS) tablet Take 1 tablet by mouth daily.    . pantoprazole (PROTONIX) 40 MG tablet Take 1 tablet (40 mg total) by mouth 2 (two) times daily. 60 tablet 3  . spironolactone (ALDACTONE) 25 MG  tablet Take 1 tablet (25 mg total) by mouth daily. 30 tablet 3   No current facility-administered medications for this visit.    Allergies:  Iodinated diagnostic agents; Lipitor [atorvastatin calcium]; and Sulfa drugs cross reactors   Social History: The patient  reports that he has never smoked. He has never used smokeless tobacco. He reports that he does not drink alcohol or use drugs.   ROS:  Please see the history of present illness. Otherwise, complete review of systems is positive for hearing loss, insomnia.  All other systems are reviewed and negative.   Physical Exam: VS:  BP 124/78 (BP Location: Right Arm)   Pulse 87   Ht 5\' 7"  (1.702 m)   Wt 165 lb (74.8 kg)   SpO2 96%   BMI 25.84 kg/m , BMI Body mass index is 25.84 kg/m.  Wt Readings from Last 3 Encounters:  08/12/17 165 lb (74.8 kg)  07/30/17 171 lb (77.6 kg)  06/20/17 164 lb 6.4 oz (74.6 kg)    General: Elderly male, appears comfortable at rest. HEENT: Conjunctiva and lids normal, oropharynx clear. Neck: Supple, no elevated JVP or carotid bruits, no thyromegaly. Lungs: Decreased breath sounds in the bases, no wheezing,, nonlabored breathing at rest. Cardiac: Regular rate and rhythm, no S3, soft systolic murmur, no pericardial rub. Abdomen: Soft, nontender, bowel sounds present, no guarding or rebound. Extremities: No pitting edema, distal pulses 2+. Skin: Warm and dry. Musculoskeletal: No kyphosis. Neuropsychiatric: Alert and oriented x3, affect grossly appropriate.  ECG: I personally reviewed the tracing from 07/30/2017 which showed rate-controlled atrial fibrillation with old inferior infarct pattern, IVCD with poor R-wave progression and repolarization abnormalities.  Recent Labwork: 02/12/2017: TSH 2.190 07/30/2017: ALT 19; AST 18; BNP 438.2; Hemoglobin 13.3; Platelets 212 08/05/2017: BUN 17; Creatinine, Ser 1.28; Potassium 5.0; Sodium 145     Component Value Date/Time   CHOL 140 02/12/2017 0908   TRIG 70  02/12/2017 0908   HDL 56 02/12/2017 0908   CHOLHDL 2.5 02/12/2017 0908   CHOLHDL 2.5 12/16/2015 0931   VLDL 19 12/16/2015 0931   LDLCALC 70 02/12/2017 0908    Other Studies Reviewed Today:  Echocardiogram 05/21/2014: Study Conclusions  - Left ventricle: The cavity size was mildly dilated. Wall thickness was normal. The estimated ejection fraction was 48% by Simpson&'s biplane. Mild inferior hypokinesis. The study is not technically sufficient to allow evaluation of LV diastolic function. - Mitral valve: Mildly thickened leaflets . There was mild regurgitation. - Left atrium: Severely dilated (68 ml/m2). - Atrial septum: No defect or patent foramen ovale was identified. - Tricuspid valve: There was trivial regurgitation. - Pulmonary arteries: PA peak pressure: 27 mm Hg (S).  Impressions:  - [name] 48% by Simpson's, mild inferior hypokinesis, severe LAE, mild MR, trace to mild TR, normal RVSP.  Assessment and Plan:  1. Dyspnea on exertion, no definite angina symptoms, also feeling of abdominal protuberance. Since starting Aldactone about  a week ago per PCP his weight has come down. He does not report any progressive symptoms at this time.  2. Ischemic cardiomyopathy, [name] 45-50% range as of 2015. Follow-up echocardiogram will be obtained.  3. CAD status post LAD angioplasty in 1992 and BMS to the RCA in 2004. Plan to obtain a Lexiscan Myoview to assess ischemic burden. Expect the results will be abnormal, will bring him back to the office and discuss with patient and his children options for management. It may be that we can adjust his medical therapy.  4. Chronic atrial fibrillation. Heart rate looks to be adequately controlled and he continues on Eliquis for stroke prophylaxis.  5. Possible CKD stage II, creatinine 1.28, potassium 5.0. He has follow with PCP later in the month per son's recollection. He should have a repeat BMET since started on Aldactone.  Current  medicines were reviewed with the patient today.   Orders Placed This Encounter  Procedures  . NM Myocar Multi W/Spect W/Wall Motion / EF  . ECHOCARDIOGRAM COMPLETE    Disposition: Follow-up in one month.  Signed, Satira Sark, MD, [facility] 08/12/2017 10:38 AM    [place] [facility] [place]. [address] [place], [place] [address] Phone: [phone]; Fax: [phone]

## 2017-08-12 ENCOUNTER — Encounter: Payer: Self-pay | Admitting: Cardiology

## 2017-08-12 ENCOUNTER — Ambulatory Visit (INDEPENDENT_AMBULATORY_CARE_PROVIDER_SITE_OTHER): Payer: Medicare Other | Admitting: Cardiology

## 2017-08-12 VITALS — BP 124/78 | HR 87 | Ht 67.0 in | Wt 165.0 lb

## 2017-08-12 DIAGNOSIS — I482 Chronic atrial fibrillation, unspecified: Secondary | ICD-10-CM

## 2017-08-12 DIAGNOSIS — R0609 Other forms of dyspnea: Secondary | ICD-10-CM | POA: Diagnosis not present

## 2017-08-12 DIAGNOSIS — I2510 Atherosclerotic heart disease of native coronary artery without angina pectoris: Secondary | ICD-10-CM

## 2017-08-12 DIAGNOSIS — I255 Ischemic cardiomyopathy: Secondary | ICD-10-CM | POA: Diagnosis not present

## 2017-08-12 NOTE — Patient Instructions (Signed)
Medication Instructions: No change  Labwork: None   Procedures/Testing: Your physician has requested that you have a lexiscan myoview. For further information please visit [email or web address]. Please follow instruction sheet, as given.  Your physician has requested that you have an echocardiogram. Echocardiography is a painless test that uses sound waves to create images of your heart. It provides your doctor with information about the size and shape of your heart and how well your heart's chambers and valves are working. This procedure takes approximately one hour. There are no restrictions for this procedure.    Follow-Up: 1 month Dr Domenic Polite  Any Additional Special Instructions Will Be Listed Below (If Applicable).     If you need a refill on your cardiac medications before your next appointment, please call your pharmacy.        Thank you for choosing [facility] !

## 2017-08-15 ENCOUNTER — Encounter (HOSPITAL_COMMUNITY)
Admission: RE | Admit: 2017-08-15 | Discharge: 2017-08-15 | Disposition: A | Discharge status: Home / Self Care | Payer: Medicare Other | Source: Ambulatory Visit | Attending: Cardiology | Admitting: Cardiology

## 2017-08-15 ENCOUNTER — Encounter (HOSPITAL_BASED_OUTPATIENT_CLINIC_OR_DEPARTMENT_OTHER)
Admission: RE | Admit: 2017-08-15 | Discharge: 2017-08-15 | Disposition: A | Discharge status: Home / Self Care | Payer: Medicare Other | Source: Ambulatory Visit | Attending: Cardiology | Admitting: Cardiology

## 2017-08-15 ENCOUNTER — Encounter (HOSPITAL_COMMUNITY): Payer: Self-pay

## 2017-08-15 ENCOUNTER — Ambulatory Visit (HOSPITAL_BASED_OUTPATIENT_CLINIC_OR_DEPARTMENT_OTHER)
Admission: RE | Admit: 2017-08-15 | Discharge: 2017-08-15 | Disposition: A | Discharge status: Home / Self Care | Payer: Medicare Other | Source: Ambulatory Visit | Attending: Cardiology | Admitting: Cardiology

## 2017-08-15 DIAGNOSIS — I420 Dilated cardiomyopathy: Secondary | ICD-10-CM

## 2017-08-15 DIAGNOSIS — I255 Ischemic cardiomyopathy: Secondary | ICD-10-CM | POA: Insufficient documentation

## 2017-08-15 DIAGNOSIS — I080 Rheumatic disorders of both mitral and aortic valves: Secondary | ICD-10-CM | POA: Insufficient documentation

## 2017-08-15 LAB — NM MYOCAR MULTI W/SPECT W/WALL MOTION / EF
CHL CUP NUCLEAR SSS: 22
CSEPPHR: 100 {beats}/min
LV dias vol: 260 mL (ref 62–150)
LV sys vol: 218 mL
RATE: 0.39
Rest HR: 73 {beats}/min
SDS: 2
SRS: 20
TID: 1

## 2017-08-15 LAB — ECHOCARDIOGRAM COMPLETE
AV Area VTI: 2.34 cm2
AV Mean grad: 3 mmHg
AV VEL mean LVOT/AV: 0.79
AV area mean vel ind: 1.31 cm2/m2
AV peak Index: 1.24
AV vel: 2.24
AVA: 2.24 cm2
AVAREAMEANV: 2.48 cm2
AVAREAVTIIND: 1.18 cm2/m2
AVLVOTPG: 3 mmHg
AVPG: 6 mmHg
AVPHT: 636 ms
AVPKVEL: 120 cm/s
Ao pk vel: 0.75 m/s
CHL CUP STROKE VOLUME: 64 mL
CHL CUP TV REG PEAK VELOCITY: 227 cm/s
DOP CAL AO MEAN VELOCITY: 75.6 cm/s
E decel time: 141 msec
FS: 16 % — AB (ref 28–44)
IV/PV OW: 0.68
LA ID, A-P, ES: 46 mm
LA diam end sys: 46 mm
LA diam index: 2.43 cm/m2
LA vol A4C: 121 ml
LAVOL: 118 mL
LAVOLIN: 62.3 mL/m2
LV PW d: 10.8 mm — AB (ref 0.6–1.1)
LV SIMPSON'S DISK: 35
LV dias vol: 185 mL — AB (ref 62–150)
LV sys vol index: 64 mL/m2
LVDIAVOLIN: 98 mL/m2
LVOT VTI: 17.8 cm
LVOT area: 3.14 cm2
LVOT peak VTI: 0.71 cm
LVOTD: 20 mm
LVOTPV: 89.4 cm/s
LVOTSV: 56 mL
LVSYSVOL: 121 mL — AB
MV Dec: 141
MV pk E vel: 80.9 m/s
MVPG: 3 mmHg
PISA EROA: 0.07 cm2
RV LATERAL S' VELOCITY: 8.81 cm/s
RV TAPSE: 11.8 mm
RV sys press: 24 mmHg
TRMAXVEL: 227 cm/s
VTI: 160 cm
VTI: 24.9 cm
Valve area index: 1.18

## 2017-08-15 MED ORDER — TECHNETIUM TC 99M TETROFOSMIN IV KIT
30.0000 | PACK | Freq: Once | INTRAVENOUS | Status: AC | PRN
  Administered 2017-08-15: 30 via INTRAVENOUS

## 2017-08-15 MED ORDER — REGADENOSON 0.4 MG/5ML IV SOLN
INTRAVENOUS | Status: AC
  Administered 2017-08-15: 0.4 mg via INTRAVENOUS
  Filled 2017-08-15: qty 5

## 2017-08-15 MED ORDER — SODIUM CHLORIDE 0.9% FLUSH
INTRAVENOUS | Status: AC
  Administered 2017-08-15: 10 mL via INTRAVENOUS
  Filled 2017-08-15: qty 10

## 2017-08-15 MED ORDER — TECHNETIUM TC 99M TETROFOSMIN IV KIT
10.0000 | PACK | Freq: Once | INTRAVENOUS | Status: AC | PRN
  Administered 2017-08-15: 10 via INTRAVENOUS

## 2017-08-15 NOTE — Progress Notes (Signed)
*  PRELIMINARY RESULTS* Echocardiogram 2D Echocardiogram has been performed.  Henry Ayala 08/15/2017, 9:25 AM

## 2017-08-20 ENCOUNTER — Other Ambulatory Visit: Payer: Self-pay | Admitting: Family Medicine

## 2017-08-29 ENCOUNTER — Other Ambulatory Visit: Payer: Self-pay | Admitting: Family Medicine

## 2017-08-29 DIAGNOSIS — I10 Essential (primary) hypertension: Secondary | ICD-10-CM

## 2017-09-02 ENCOUNTER — Ambulatory Visit: Payer: Medicare Other | Admitting: Cardiology

## 2017-09-03 ENCOUNTER — Other Ambulatory Visit: Payer: Self-pay | Admitting: Family Medicine

## 2017-09-03 DIAGNOSIS — I10 Essential (primary) hypertension: Secondary | ICD-10-CM

## 2017-09-05 ENCOUNTER — Ambulatory Visit (INDEPENDENT_AMBULATORY_CARE_PROVIDER_SITE_OTHER): Payer: Medicare Other | Admitting: Family Medicine

## 2017-09-05 ENCOUNTER — Encounter: Payer: Self-pay | Admitting: Family Medicine

## 2017-09-05 VITALS — BP 122/75 | HR 71 | Temp 97.0°F | Ht 67.0 in | Wt 168.8 lb

## 2017-09-05 DIAGNOSIS — Z23 Encounter for immunization: Secondary | ICD-10-CM

## 2017-09-05 DIAGNOSIS — I5022 Chronic systolic (congestive) heart failure: Secondary | ICD-10-CM | POA: Diagnosis not present

## 2017-09-05 DIAGNOSIS — I255 Ischemic cardiomyopathy: Secondary | ICD-10-CM

## 2017-09-05 LAB — BMP8+EGFR
BUN/Creatinine Ratio: 11 (ref 10–24)
BUN: 14 mg/dL (ref 8–27)
CO2: 25 mmol/L (ref 20–29)
CREATININE: 1.24 mg/dL (ref 0.76–1.27)
Calcium: 9.6 mg/dL (ref 8.6–10.2)
Chloride: 100 mmol/L (ref 96–106)
GFR calc Af Amer: 61 mL/min/{1.73_m2} (ref >59)
GFR, EST NON AFRICAN AMERICAN: 53 mL/min/{1.73_m2} — AB (ref >59)
GLUCOSE: 82 mg/dL (ref 65–99)
Potassium: 4.7 mmol/L (ref 3.5–5.2)
SODIUM: 142 mmol/L (ref 134–144)

## 2017-09-05 NOTE — Patient Instructions (Signed)
Great to see you!   

## 2017-09-05 NOTE — Progress Notes (Signed)
   HPI  Patient presents today follow-up for dyspnea on exertion.  Patient was seen about 4-6 weeks ago with worsening dyspnea on exertion.  He was found to have elevated BNP and placed on spironolactone.  He is tolerated this well.  He has had follow-up with cardiology since that time and has had repeat echocardiogram showing EF of 25-30%.  Patient denies any shortness of breath and states that he is tolerating medication well.  He believes his breathing is back to normal.    PMH: Smoking status noted ROS: Per HPI  Objective: BP 122/75   Pulse 71   Temp (!) 97 F (36.1 C) (Oral)   Ht '5\' 7"'$  (1.702 m)   Wt 168 lb 12.8 oz (76.6 kg)   BMI 26.44 kg/m  Gen: NAD, alert, cooperative with exam HEENT: NCAT CV: RRR, good S1/S2, no murmur Resp: CTABL, no wheezes, non-labored Ext: No edema, warm Neuro: Alert and oriented, No gross deficits  Assessment and plan:  #Chronic systolic CHF New diagnosis with echocardiogram on October 4. Patient doing well, asymptomatic at this time per his report. Continue spironolactone, repeat BMP today, discussed the importance of frequent lab checks with spironolactone.  Patient's last potassium was 5.0. Repeat monthly for 2 more months, then plan potassium checks every 3 months or so.  Need for influenza vaccine-counseling provided for all vaccine components  Orders Placed This Encounter  Procedures  . [place], MD [facility] 09/05/2017, 10:59 AM

## 2017-09-24 NOTE — Progress Notes (Signed)
Cardiology Office Note  Date: 09/25/2017   ID: SUE MCALEXANDER, DOB 25-May-1931, MRN [id number]  PCP: Timmothy Euler, MD  Primary Cardiologist: Rozann Lesches, MD   Chief Complaint  Patient presents with  . Cardiomyopathy  . Coronary Artery Disease    History of Present Illness: Henry Ayala is an 83 y.o. male last seen in October.  He presents today with his son for a follow-up visit.  Clinically, he is doing much better, shortness of breath has improved significantly.  His weight is down about 2 pounds.  Follow-up echocardiogram in October revealed reduction in [name] to the range of 25-30% with wall motion abnormality consistent with ischemic heart disease, also moderate aortic regurgitation, and mild to moderate mitral regurgitation.  Lexiscan Myoview also revealed a large region of scar involving the anterior, anteroseptal, and inferior walls without active ischemia.  We went over the results and implications today.  Our plan will be medical therapy at this time.  I reviewed his medications.  We will be switching from Lopressor to Toprol-XL.  Eventually ARB will be considered depending on blood pressure and renal function.  Past Medical History:  Diagnosis Date  . Anteroseptal myocardial infarction [facility])    PTCA to LAD 1992  . BPH (benign prostatic hyperplasia)   . CAD (coronary artery disease)    PTCA to LAD 1992, BMS to RCA 2004  . Chronic atrial fibrillation ([place])   . Essential hypertension   . GERD (gastroesophageal reflux disease)   . GI bleed    March 2017 - esophagitis  . Hiatal hernia   . Hyperlipidemia   . Ischemic cardiomyopathy    [name] 48% 2015  . Myocardial infarction, inferior wall ([name])    BMS to proximal RCA 2004 at [facility]  . Nephrolithiasis   . Sleep apnea    On CPAP    Past Surgical History:  Procedure Laterality Date  . TOTAL KNEE ARTHROPLASTY     right    Current Outpatient Medications  Medication Sig Dispense Refill  . alum &  mag hydroxide-simeth (MAALOX/MYLANTA) 200-200-20 MG/5ML suspension Take 30 mLs by mouth every 6 (six) hours as needed for indigestion or heartburn. 355 mL 2  . apixaban (ELIQUIS) 5 MG TABS tablet Take 1 tablet (5 mg total) by mouth 2 (two) times daily. 180 tablet 3  . bismuth subsalicylate (PEPTO BISMOL) 262 MG/15ML suspension Take 30 mLs by mouth every 6 (six) hours as needed for diarrhea or loose stools.    . doxazosin (CARDURA) 4 MG tablet Take 1 tablet (4 mg total) by mouth daily. 90 tablet 3  . escitalopram (LEXAPRO) 5 MG tablet Take 1 tablet (5 mg total) by mouth daily. 90 tablet 3  . lovastatin (MEVACOR) 40 MG tablet Take 1 tablet (40 mg total) by mouth daily. 90 tablet 3  . metoprolol tartrate (LOPRESSOR) 25 MG tablet TAKE ONE-HALF TABLET BY MOUTH ONCE DAILY OR TAKE AS DIRECTED 90 tablet 1  . Multiple Vitamins-Minerals (MULTIVITAMIN WITH MINERALS) tablet Take 1 tablet by mouth daily.    . pantoprazole (PROTONIX) 40 MG tablet TAKE 1 TABLET BY MOUTH TWICE DAILY 180 tablet 1  . spironolactone (ALDACTONE) 25 MG tablet Take 1 tablet (25 mg total) by mouth daily. 30 tablet 3   No current facility-administered medications for this visit.    Allergies:  Iodinated diagnostic agents; Lipitor [atorvastatin calcium]; and Sulfa drugs cross reactors   Social History: The patient  reports that  has never smoked. he has  never used smokeless tobacco. He reports that he does not drink alcohol or use drugs.   ROS:  Please see the history of present illness. Otherwise, complete review of systems is positive for hearing loss.  All other systems are reviewed and negative.   Physical Exam: VS:  BP 124/80   Pulse 74   Ht 5\' 7"  (1.702 m)   Wt 166 lb (75.3 kg)   SpO2 94%   BMI 26.00 kg/m , BMI Body mass index is 26 kg/m.  Wt Readings from Last 3 Encounters:  09/25/17 166 lb (75.3 kg)  09/05/17 168 lb 12.8 oz (76.6 kg)  08/12/17 165 lb (74.8 kg)    General: Elderly male, appears comfortable at  rest. HEENT: Conjunctiva and lids normal, oropharynx clear. Neck: Supple, no elevated JVP or carotid bruits, no thyromegaly. Lungs: Decreased breath sounds without wheezing, nonlabored breathing at rest. Cardiac: Regular rate and rhythm, no S3, 2/6 systolic murmur, no pericardial rub. Abdomen: Soft, nontender, bowel sounds present, no guarding or rebound. Extremities: No pitting edema, distal pulses 2+. Skin: Warm and dry. Musculoskeletal: No kyphosis. Neuropsychiatric: Alert and oriented x3, affect grossly appropriate.  ECG: I personally reviewed the tracing from 07/30/2017 which showed atrial fibrillation with IVCD, possible old anterior infarct pattern, left anterior fascicular block.  Recent Labwork: 02/12/2017: TSH 2.190 07/30/2017: ALT 19; AST 18; BNP 438.2; Hemoglobin 13.3; Platelets 212 09/05/2017: BUN 14; Creatinine, Ser 1.24; Potassium 4.7; Sodium 142     Component Value Date/Time   CHOL 140 02/12/2017 0908   TRIG 70 02/12/2017 0908   HDL 56 02/12/2017 0908   CHOLHDL 2.5 02/12/2017 0908   CHOLHDL 2.5 12/16/2015 0931   VLDL 19 12/16/2015 0931   LDLCALC 70 02/12/2017 0908    Other Studies Reviewed Today:  Echocardiogram 08/15/2017: Study Conclusions  - Left ventricle: The cavity size was moderately dilated. Wall   thickness was normal. Systolic function was severely reduced. The   estimated ejection fraction was in the range of 25% to 30%.   Diffuse hypokinesis. The study is not technically sufficient to   allow evaluation of LV diastolic function. - Regional wall motion abnormality: Akinesis of the mid anterior   and basal-mid anteroseptal myocardium. - Aortic valve: There was moderate regurgitation. Valve area (VTI):   2.24 cm^2. Valve area (Vmax): 2.34 cm^2. Valve area (Vmean): 2.48   cm^2. - Mitral valve: Mildly calcified annulus. Normal thickness leaflets   . There was mild to moderate regurgitation. The MR VC is 0.4 cm. - Left atrium: The atrium was severely  dilated. - Right ventricle: Systolic function was mildly reduced. - Right atrium: The atrium was mildly dilated. - Technically adequate study.  Lexiscan Myoview 08/15/2017:  There was no ST segment deviation noted during stress.  Findings consistent with large anterior/anteroseptal/inferior prior myocardial infarction. Large extensive scarring with current ischemia  The left ventricular ejection fraction is severely decreased (<30%).  This is a high risk study. High risk due to low [name] and extensive scar. No evidence of current ischemia.  Assessment and Plan:  1.  Ischemic cardiomyopathy, [name] 25-30% range based on recent testing.  Myoview study showed large region of scar in the anteroseptal, anterior, and inferior walls, although no large ischemic burden.  He is not reporting angina and had improvement in shortness of breath following initiation of Aldactone.  Plan is to continue medical therapy.  We will switch from Lopressor to Toprol-XL, and then consider addition of low-dose ARB next depending on blood pressure and renal  function.  2.  CAD with previous LAD angioplasty in 1992 with BMS to the RCA in 2004.  Recent testing is consistent with progressive ischemic heart disease.  In the absence of accelerating angina we will continue with medical therapy at this point.  3.  Chronic atrial fibrillation, heart rate adequately controlled on beta-blocker.  Continue Eliquis for stroke prophylaxis.  4.  CKD stage II, recent creatinine 1.24.  Current medicines were reviewed with the patient today.  Disposition: Follow-up in 3 months.  Signed, Satira Sark, MD, [facility] 09/25/2017 11:51 AM    [place] [facility] [place]. [address], [place], [place] [address] Phone: [phone]; Fax: [phone]

## 2017-09-25 ENCOUNTER — Ambulatory Visit (INDEPENDENT_AMBULATORY_CARE_PROVIDER_SITE_OTHER): Payer: Medicare Other | Admitting: Cardiology

## 2017-09-25 ENCOUNTER — Encounter: Payer: Self-pay | Admitting: Cardiology

## 2017-09-25 VITALS — BP 124/80 | HR 74 | Ht 67.0 in | Wt 166.0 lb

## 2017-09-25 DIAGNOSIS — I482 Chronic atrial fibrillation, unspecified: Secondary | ICD-10-CM

## 2017-09-25 DIAGNOSIS — N182 Chronic kidney disease, stage 2 (mild): Secondary | ICD-10-CM | POA: Diagnosis not present

## 2017-09-25 DIAGNOSIS — I255 Ischemic cardiomyopathy: Secondary | ICD-10-CM | POA: Diagnosis not present

## 2017-09-25 DIAGNOSIS — I2510 Atherosclerotic heart disease of native coronary artery without angina pectoris: Secondary | ICD-10-CM

## 2017-09-25 MED ORDER — METOPROLOL SUCCINATE ER 25 MG PO TB24: 25.0000 mg | ORAL_TABLET | Freq: Every day | ORAL | 3 refills | Status: DC

## 2017-09-25 NOTE — Patient Instructions (Signed)
Medication Instructions:  STOP LOPRESSOR  START TOPROL XL 25 MG - ONCE DAILY   Labwork: NONE  Testing/Procedures: NONE  Follow-Up: Your physician recommends that you schedule a follow-up appointment in: 3 MONTHS   Any Other Special Instructions Will Be Listed Below (If Applicable).     If you need a refill on your cardiac medications before your next appointment, please call your pharmacy.

## 2017-10-02 ENCOUNTER — Other Ambulatory Visit: Payer: Medicare Other

## 2017-10-02 DIAGNOSIS — I2510 Atherosclerotic heart disease of native coronary artery without angina pectoris: Secondary | ICD-10-CM

## 2017-10-03 LAB — BMP8+EGFR
BUN/Creatinine Ratio: 12 (ref 10–24)
BUN: 13 mg/dL (ref 8–27)
CO2: 28 mmol/L (ref 20–29)
CREATININE: 1.11 mg/dL (ref 0.76–1.27)
Calcium: 9.6 mg/dL (ref 8.6–10.2)
Chloride: 98 mmol/L (ref 96–106)
GFR calc Af Amer: 70 mL/min/{1.73_m2} (ref >59)
GFR calc non Af Amer: 61 mL/min/{1.73_m2} (ref >59)
GLUCOSE: 87 mg/dL (ref 65–99)
Potassium: 4 mmol/L (ref 3.5–5.2)
Sodium: 142 mmol/L (ref 134–144)

## 2017-10-04 ENCOUNTER — Encounter: Payer: Self-pay | Admitting: Family Medicine

## 2017-10-21 ENCOUNTER — Ambulatory Visit: Payer: Medicare Other | Admitting: Family Medicine

## 2017-10-22 ENCOUNTER — Ambulatory Visit: Payer: Medicare Other | Admitting: Family Medicine

## 2017-10-28 ENCOUNTER — Encounter: Payer: Self-pay | Admitting: Family Medicine

## 2017-10-28 ENCOUNTER — Ambulatory Visit (INDEPENDENT_AMBULATORY_CARE_PROVIDER_SITE_OTHER): Payer: Medicare Other | Admitting: Family Medicine

## 2017-10-28 VITALS — BP 123/80 | HR 78 | Temp 94.8°F | Ht 67.0 in | Wt 167.4 lb

## 2017-10-28 DIAGNOSIS — I255 Ischemic cardiomyopathy: Secondary | ICD-10-CM

## 2017-10-28 DIAGNOSIS — I5022 Chronic systolic (congestive) heart failure: Secondary | ICD-10-CM | POA: Diagnosis not present

## 2017-10-28 DIAGNOSIS — I482 Chronic atrial fibrillation, unspecified: Secondary | ICD-10-CM

## 2017-10-28 NOTE — Patient Instructions (Signed)
Great to see you!  Come back in 3 months unless you need us sooner.    

## 2017-10-28 NOTE — Progress Notes (Signed)
   HPI  Patient presents today here for follow-up of CHF.  Patient was seen with dyspnea felt to be due to CHF after elevated BNP was found. He was started on Aldactone and has done well.  He has had standard follow-up potassiums with no problems. Is tolerating medication well. Dyspnea has improved.  No leg swelling.  Good medication compliance with novel anticoagulant, no bleeding. No racing heart  PMH: Smoking status noted ROS: Per HPI  Objective: BP 123/80   Pulse 78   Temp (!) 94.8 F (34.9 C) (Oral)   Ht '5\' 7"'$  (1.702 m)   Wt 167 lb 6.4 oz (75.9 kg)   BMI 26.22 kg/m  Gen: NAD, alert, cooperative with exam HEENT: NCAT CV: RRR, good S1/S2, no murmur Resp: CTABL, no wheezes, non-labored Ext: No edema, warm Neuro: Alert and oriented, No gross deficits  Assessment and plan:  #Chronic CHF Patient doing well, appears euvolemic Dyspnea improved. Continue statin, beta-blocker, Aldactone. BMP today  #Chronic atrial fibrillation Rate controlled on beta-blocker, also on chronic anticoagulation with eliquis CBC   Orders Placed This Encounter  Procedures  . CBC with Differential/Platelet  . [place], MD [facility] 10/28/2017, 1:12 PM

## 2017-10-29 LAB — CBC WITH DIFFERENTIAL/PLATELET
Basophils Absolute: 0 10*3/uL (ref 0.0–0.2)
Basos: 0 %
EOS (ABSOLUTE): 0.2 10*3/uL (ref 0.0–0.4)
EOS: 2 %
HEMATOCRIT: 46.8 % (ref 37.5–51.0)
Hemoglobin: 15.7 g/dL (ref 13.0–17.7)
IMMATURE GRANULOCYTES: 0 %
Immature Grans (Abs): 0 10*3/uL (ref 0.0–0.1)
LYMPHS ABS: 2.1 10*3/uL (ref 0.7–3.1)
Lymphs: 28 %
MCH: 30.2 pg (ref 26.6–33.0)
MCHC: 33.5 g/dL (ref 31.5–35.7)
MCV: 90 fL (ref 79–97)
MONOS ABS: 0.4 10*3/uL (ref 0.1–0.9)
Monocytes: 5 %
NEUTROS PCT: 65 %
Neutrophils Absolute: 4.9 10*3/uL (ref 1.4–7.0)
PLATELETS: 179 10*3/uL (ref 150–379)
RBC: 5.2 x10E6/uL (ref 4.14–5.80)
RDW: 18.3 % — ABNORMAL HIGH (ref 12.3–15.4)
WBC: 7.5 10*3/uL (ref 3.4–10.8)

## 2017-10-29 LAB — BMP8+EGFR
BUN/Creatinine Ratio: 14 (ref 10–24)
BUN: 15 mg/dL (ref 8–27)
CO2: 31 mmol/L — AB (ref 20–29)
Calcium: 9.6 mg/dL (ref 8.6–10.2)
Chloride: 100 mmol/L (ref 96–106)
Creatinine, Ser: 1.08 mg/dL (ref 0.76–1.27)
GFR calc Af Amer: 72 mL/min/{1.73_m2} (ref >59)
GFR, EST NON AFRICAN AMERICAN: 63 mL/min/{1.73_m2} (ref >59)
Glucose: 92 mg/dL (ref 65–99)
Potassium: 5 mmol/L (ref 3.5–5.2)
SODIUM: 143 mmol/L (ref 134–144)

## 2017-11-20 ENCOUNTER — Ambulatory Visit (INDEPENDENT_AMBULATORY_CARE_PROVIDER_SITE_OTHER): Payer: Medicare Other | Admitting: *Deleted

## 2017-11-20 VITALS — BP 118/68 | HR 50 | Temp 96.7°F | Ht 67.0 in | Wt 167.0 lb

## 2017-11-20 DIAGNOSIS — Z0000 Encounter for general adult medical examination without abnormal findings: Secondary | ICD-10-CM

## 2017-11-20 DIAGNOSIS — Z23 Encounter for immunization: Secondary | ICD-10-CM

## 2017-11-20 MED ORDER — SPIRONOLACTONE 25 MG PO TABS: 25.0000 mg | ORAL_TABLET | Freq: Every day | ORAL | 3 refills | Status: DC

## 2017-11-20 MED ORDER — ESCITALOPRAM OXALATE 5 MG PO TABS: 5.0000 mg | ORAL_TABLET | Freq: Every day | ORAL | 3 refills | Status: DC

## 2017-11-20 NOTE — Progress Notes (Signed)
Subjective:   Henry Ayala is a 84 y.o. male who presents for Medicare Annual/Subsequent preventive examination. He is a retired Multimedia programmer who enjoys playing golf and watching ball games. For exercise, he walks some when the weather is nice. He eats a semi-healthy diet and has 3 good meals a day. He attends church regularly and gets out of the house for dinner with friends about twice a week. His wife passed away about 3 years ago and since then his son has moved in with him. He has a caregiver that comes in daily and cooks breakfast for him and helps with his medications. His daughter lives in [place]. They do not have pets in the home and fall precautions were discussed today. He states that his health is about the same as it was a year ago.   tCardiac Risk Factors include: advanced age (>16men, >93 women);male gender;hypertension     Objective:    Vitals: BP 118/68 (BP Location: Right Arm)   Pulse (!) 50   Temp (!) 96.7 F (35.9 C) (Oral)   Ht 5\' 7"  (1.702 m)   Wt 167 lb (75.8 kg)   BMI 26.16 kg/m   Body mass index is 26.16 kg/m.  Advanced Directives 11/20/2017 05/09/2016 02/12/2016 06/30/2014  Does Patient Have a Medical Advance Directive? No Yes No No  Type of Advance Directive - Living will - -  Would patient like information on creating a medical advance directive? No - Patient declined - No - patient declined information No - patient declined information    Tobacco Social History   Tobacco Use  Smoking Status Never Smoker  Smokeless Tobacco Never Used     Counseling given: Not Answered   Clinical Intake:                       Past Medical History:  Diagnosis Date  . Anteroseptal myocardial infarction [facility])    PTCA to LAD 1992  . BPH (benign prostatic hyperplasia)   . CAD (coronary artery disease)    PTCA to LAD 1992, BMS to RCA 2004  . Chronic atrial fibrillation ([place])   . Essential hypertension   . GERD (gastroesophageal reflux disease)   . GI  bleed    March 2017 - esophagitis  . Hiatal hernia   . Hyperlipidemia   . Ischemic cardiomyopathy    [name] 48% 2015  . Myocardial infarction, inferior wall ([name])    BMS to proximal RCA 2004 at [facility]  . Nephrolithiasis   . Sleep apnea    On CPAP   Past Surgical History:  Procedure Laterality Date  . CARDIOVERSION N/A 06/30/2014   Procedure: CARDIOVERSION;  Surgeon: Sanda Klein, MD;  Location: [facility] ENDOSCOPY;  Service: Cardiovascular;  Laterality: N/A;  . ESOPHAGOGASTRODUODENOSCOPY Left 02/14/2016   Procedure: ESOPHAGOGASTRODUODENOSCOPY (EGD);  Surgeon: Wonda Horner, MD;  Location: [facility];  Service: Endoscopy;  Laterality: Left;  . TOTAL KNEE ARTHROPLASTY     right   Family History  Problem Relation Age of Onset  . Heart attack Father   . Stroke Mother   . Cancer Mother        colon  . Heart attack Brother 26  . Cancer Brother        prostate  . Cancer Sister        colon  . Parkinson's disease Sister   . Cancer Daughter        unknown origin   . GI problems Son  Social History   Socioeconomic History  . Marital status: Widowed    Spouse name: None  . Number of children: None  . Years of education: None  . Highest education level: None  Social Needs  . Financial resource strain: None  . Food insecurity - worry: None  . Food insecurity - inability: None  . Transportation needs - medical: None  . Transportation needs - non-medical: None  Occupational History  . Occupation: retired    Comment: 1999- [employer]  Tobacco Use  . Smoking status: Never Smoker  . Smokeless tobacco: Never Used  Substance and Sexual Activity  . Alcohol use: Yes    Alcohol/week: 0.0 oz    Comment: occasionally drink  . Drug use: No  . Sexual activity: None  Other Topics Concern  . None  Social History Narrative  . None    Outpatient Encounter Medications as of 11/20/2017  Medication Sig  . alum & mag hydroxide-simeth (MAALOX/MYLANTA) 200-200-20 MG/5ML suspension Take 30 mLs  by mouth every 6 (six) hours as needed for indigestion or heartburn.  [name] apixaban (ELIQUIS) 5 MG TABS tablet Take 1 tablet (5 mg total) by mouth 2 (two) times daily.  [name] bismuth subsalicylate (PEPTO BISMOL) 262 MG/15ML suspension Take 30 mLs by mouth every 6 (six) hours as needed for diarrhea or loose stools.  . doxazosin (CARDURA) 4 MG tablet Take 1 tablet (4 mg total) by mouth daily.  [name] escitalopram (LEXAPRO) 5 MG tablet Take 1 tablet (5 mg total) by mouth daily.  [name] lovastatin (MEVACOR) 40 MG tablet Take 1 tablet (40 mg total) by mouth daily.  . metoprolol succinate (TOPROL XL) 25 MG 24 hr tablet Take 1 tablet (25 mg total) daily by mouth.  . Multiple Vitamins-Minerals (MULTIVITAMIN WITH MINERALS) tablet Take 1 tablet by mouth daily.  . pantoprazole (PROTONIX) 40 MG tablet TAKE 1 TABLET BY MOUTH TWICE DAILY  . spironolactone (ALDACTONE) 25 MG tablet Take 1 tablet (25 mg total) by mouth daily.  . [DISCONTINUED] escitalopram (LEXAPRO) 5 MG tablet Take 1 tablet (5 mg total) by mouth daily.  . [DISCONTINUED] spironolactone (ALDACTONE) 25 MG tablet Take 1 tablet (25 mg total) by mouth daily.   No facility-administered encounter medications on file as of 11/20/2017.     Activities of Daily Living In your present state of health, do you have any difficulty performing the following activities: 11/20/2017  Hearing? N  Vision? N  Difficulty concentrating or making decisions? Y  Walking or climbing stairs? N  Dressing or bathing? N  Doing errands, shopping? N  Preparing Food and eating ? N  Using the Toilet? N  In the past six months, have you accidently leaked urine? N  Do you have problems with loss of bowel control? N  Managing your Medications? N  Managing your Finances? N  Housekeeping or managing your Housekeeping? Y  Comment has helper  Some recent data might be hidden    Patient Care Team: Timmothy Euler, MD as PCP - General (Family Medicine) Satira Sark, MD as Consulting  Physician (Cardiology) Wonda Horner, MD as Consulting Physician (Gastroenterology)   Assessment:   This is a routine wellness examination for Brown.  Exercise Activities and Dietary recommendations Current Exercise Habits: Home exercise routine, Type of exercise: walking, Time (Minutes): 10, Frequency (Times/Week): 3, Weekly Exercise (Minutes/Week): 30, Intensity: Mild, Exercise limited by: None identified  Goals    . Prevent falls     STAY ACTIVE  Fall Risk Fall Risk  11/20/2017 10/28/2017 09/05/2017 06/20/2017 02/18/2017  Falls in the past year? No No No No No   Is the patient's home free of loose throw rugs in walkways, pet beds, electrical cords, etc?  Fall risks, precautions and hazards discussed today.  Depression Screen PHQ 2/9 Scores 11/20/2017 10/28/2017 09/05/2017 06/20/2017  PHQ - 2 Score 0 0 0 0  PHQ- 9 Score - - - -    Cognitive Function MMSE - Mini Mental State Exam 11/20/2017 10/18/2016 04/17/2016  Orientation to time 4 3 4   Orientation to Place 5 5 4   Registration 3 3 3   Attention/ Calculation 5 3 3   Recall 1 2 1   Language- name 2 objects 2 2 2   Language- repeat 1 1 1   Language- follow 3 step command 3 3 3   Language- read & follow direction 1 1 1   Write a sentence 1 1 1   Copy design 1 1 1   Total score 27 25 24         Immunization History  Administered Date(s) Administered  . Influenza Whole 10/13/2011  . Influenza, High Dose Seasonal PF 08/28/2016, 09/05/2017  . Influenza-Unspecified 09/12/2013  . Pneumococcal Polysaccharide-23 02/14/2016    Qualifies for Shingles Vaccine? Denied   Screening Tests Health Maintenance  Topic Date Due  . TETANUS/TDAP  05/28/1950  . PNA vac Low Risk Adult (2 of 2 - PCV13) 02/13/2017  . INFLUENZA VACCINE  Completed   Cancer Screenings: Lung: Low Dose CT Chest recommended if Age 31-80 years, 30 pack-year currently smoking OR have quit w/in 15years. Patient does qualify.  Additional Screenings: Hepatitis  B/HIV/Syphillis: Hepatitis C Screening:    denied  Plan:   he is to look for his advanced directives and bring [place] in a copy. He will keep follow up with Dr Wendi Snipes and other specialist He is due an eye exam - they will try to schedule Prevnar 13 given today   I have personally reviewed and noted the following in the patient's chart:   . Medical and social history . Use of alcohol, tobacco or illicit drugs  . Current medications and supplements . Functional ability and status . Nutritional status . Physical activity . Advanced directives . List of other physicians . Hospitalizations, surgeries, and ER visits in previous 12 months . Vitals . Screenings to include cognitive, depression, and falls . Referrals and appointments  In addition, I have reviewed and discussed with patient certain preventive protocols, quality metrics, and best practice recommendations. A written personalized care plan for preventive services as well as general preventive health recommendations were provided to patient.     Tashan Kreitzer, Cameron Proud, LPN  06/16/6313  I have reviewed and agree with the above AWV documentation.   Terald Sleeper PA-C [facility] [address]  [place], [place] [address] [phone]

## 2017-11-20 NOTE — Patient Instructions (Signed)
  Mr. Henry Ayala , Thank you for taking time to come for your Medicare Wellness Visit. I appreciate your ongoing commitment to your health goals. Please review the following plan we discussed and let me know if I can assist you in the future.   These are the goals we discussed: Goals    None      This is a list of the screening recommended for you and due dates:  Health Maintenance  Topic Date Due  . Tetanus Vaccine  05/28/1950  . Pneumonia vaccines (2 of 2 - PCV13) 02/13/2017  . Flu Shot  Completed   Keep follow up with DR Wendi Snipes Review the advanced directives Today you received the PREVNAR 13 - pneumonia vaccine.

## 2017-12-17 DIAGNOSIS — Z08 Encounter for follow-up examination after completed treatment for malignant neoplasm: Secondary | ICD-10-CM | POA: Diagnosis not present

## 2017-12-17 DIAGNOSIS — C44519 Basal cell carcinoma of skin of other part of trunk: Secondary | ICD-10-CM | POA: Diagnosis not present

## 2017-12-17 DIAGNOSIS — X32XXXD Exposure to sunlight, subsequent encounter: Secondary | ICD-10-CM | POA: Diagnosis not present

## 2017-12-17 DIAGNOSIS — L570 Actinic keratosis: Secondary | ICD-10-CM | POA: Diagnosis not present

## 2017-12-17 DIAGNOSIS — Z85828 Personal history of other malignant neoplasm of skin: Secondary | ICD-10-CM | POA: Diagnosis not present

## 2017-12-17 DIAGNOSIS — Z1283 Encounter for screening for malignant neoplasm of skin: Secondary | ICD-10-CM | POA: Diagnosis not present

## 2017-12-27 NOTE — Progress Notes (Signed)
Cardiology Office Note  Date: 12/31/2017   ID: [name], DOB 1931/02/26, MRN [id number]  PCP: Timmothy Euler, MD  Primary Cardiologist: Rozann Lesches, MD   Chief Complaint  Patient presents with  . Coronary Artery Disease  . Cardiomyopathy    History of Present Illness: Henry Ayala is an 84 y.o. male last seen in November 2018.  He is here today with his son for a follow-up visit.  Reports no major change in stamina, no angina symptoms or increasing shortness of breath with typical activities.  He reports compliance with his medications.  Current cardiac regimen includes Eliquis, Mevacor, Toprol-XL, and Aldactone.  His weight has been stable, he reports no orthopnea or leg swelling.  I reviewed his most recent lab work from December 2018, outlined below.  He has had no bleeding problems on Eliquis.  Past Medical History:  Diagnosis Date  . Anteroseptal myocardial infarction [facility])    PTCA to LAD 1992  . BPH (benign prostatic hyperplasia)   . CAD (coronary artery disease)    PTCA to LAD 1992, BMS to RCA 2004  . Chronic atrial fibrillation ([place])   . Essential hypertension   . GERD (gastroesophageal reflux disease)   . GI bleed    March 2017 - esophagitis  . Hiatal hernia   . Hyperlipidemia   . Ischemic cardiomyopathy    [name] 48% 2015  . Myocardial infarction, inferior wall ([name])    [facility] to proximal RCA 2004 at [facility]  . Nephrolithiasis   . Sleep apnea    On CPAP    Past Surgical History:  Procedure Laterality Date  . CARDIOVERSION N/A 06/30/2014   Procedure: CARDIOVERSION;  Surgeon: Sanda Klein, MD;  Location: [facility] ENDOSCOPY;  Service: Cardiovascular;  Laterality: N/A;  . ESOPHAGOGASTRODUODENOSCOPY Left 02/14/2016   Procedure: ESOPHAGOGASTRODUODENOSCOPY (EGD);  Surgeon: Wonda Horner, MD;  Location: [facility];  Service: Endoscopy;  Laterality: Left;  . TOTAL KNEE ARTHROPLASTY     right    Current Outpatient Medications  Medication Sig  Dispense Refill  . alum & mag hydroxide-simeth (MAALOX/MYLANTA) 200-200-20 MG/5ML suspension Take 30 mLs by mouth every 6 (six) hours as needed for indigestion or heartburn. 355 mL 2  . apixaban (ELIQUIS) 5 MG TABS tablet Take 1 tablet (5 mg total) by mouth 2 (two) times daily. 180 tablet 3  . bismuth subsalicylate (PEPTO BISMOL) 262 MG/15ML suspension Take 30 mLs by mouth every 6 (six) hours as needed for diarrhea or loose stools.    . doxazosin (CARDURA) 4 MG tablet Take 1 tablet (4 mg total) by mouth daily. 90 tablet 3  . escitalopram (LEXAPRO) 5 MG tablet Take 1 tablet (5 mg total) by mouth daily. 90 tablet 3  . lovastatin (MEVACOR) 40 MG tablet Take 1 tablet (40 mg total) by mouth daily. 90 tablet 3  . metoprolol succinate (TOPROL XL) 25 MG 24 hr tablet Take 1 tablet (25 mg total) daily by mouth. 90 tablet 3  . Multiple Vitamins-Minerals (MULTIVITAMIN WITH MINERALS) tablet Take 1 tablet by mouth daily.    . pantoprazole (PROTONIX) 40 MG tablet TAKE 1 TABLET BY MOUTH TWICE DAILY 180 tablet 1  . spironolactone (ALDACTONE) 25 MG tablet Take 1 tablet (25 mg total) by mouth daily. 90 tablet 3   No current facility-administered medications for this visit.    Allergies:  Iodinated diagnostic agents; Lipitor [atorvastatin calcium]; and Sulfa drugs cross reactors   Social History: The patient  reports that  has never  smoked. he has never used smokeless tobacco. He reports that he drinks alcohol. He reports that he does not use drugs.   ROS:  Please see the history of present illness. Otherwise, complete review of systems is positive for hearing loss.  All other systems are reviewed and negative.   Physical Exam: VS:  BP 118/74 (BP Location: Left Arm)   Pulse 76   Ht 5\' 7"  (1.702 m)   Wt 167 lb (75.8 kg)   SpO2 93%   BMI 26.16 kg/m , BMI Body mass index is 26.16 kg/m.  Wt Readings from Last 3 Encounters:  12/31/17 167 lb (75.8 kg)  11/20/17 167 lb (75.8 kg)  10/28/17 167 lb 6.4 oz (75.9  kg)    General: Elderly male, appears comfortable at rest. HEENT: Conjunctiva and lids normal, oropharynx clear. Neck: Supple, no elevated JVP or carotid bruits, no thyromegaly. Lungs: Decreased breath sounds without wheezing, nonlabored breathing at rest. Cardiac: Irregularly irregular, no S3, 2/6 systolic murmur. Abdomen: Soft, nontender, bowel sounds present. Extremities: No pitting edema, distal pulses 2+. Skin: Warm and dry. Musculoskeletal: No kyphosis. Neuropsychiatric: Alert and oriented x3, affect grossly appropriate.  ECG: I personally reviewed the tracing from 07/30/2017 which showed atrial fibrillation with PVC, IVCD, old anterior infarct pattern.  Recent Labwork: 02/12/2017: TSH 2.190 07/30/2017: ALT 19; AST 18; BNP 438.2 10/28/2017: BUN 15; Creatinine, Ser 1.08; Hemoglobin 15.7; Platelets 179; Potassium 5.0; Sodium 143     Component Value Date/Time   CHOL 140 02/12/2017 0908   TRIG 70 02/12/2017 0908   HDL 56 02/12/2017 0908   CHOLHDL 2.5 02/12/2017 0908   CHOLHDL 2.5 12/16/2015 0931   VLDL 19 12/16/2015 0931   LDLCALC 70 02/12/2017 0908    Other Studies Reviewed Today:  Echocardiogram 08/15/2017: Study Conclusions  - Left ventricle: The cavity size was moderately dilated. Wall thickness was normal. Systolic function was severely reduced. The estimated ejection fraction was in the range of 25% to 30%. Diffuse hypokinesis. The study is not technically sufficient to allow evaluation of LV diastolic function. - Regional wall motion abnormality: Akinesis of the mid anterior and basal-mid anteroseptal myocardium. - Aortic valve: There was moderate regurgitation. Valve area (VTI): 2.24 cm^2. Valve area (Vmax): 2.34 cm^2. Valve area (Vmean): 2.48 cm^2. - Mitral valve: Mildly calcified annulus. Normal thickness leaflets . There was mild to moderate regurgitation. The MR VC is 0.4 cm. - Left atrium: The atrium was severely dilated. - Right ventricle:  Systolic function was mildly reduced. - Right atrium: The atrium was mildly dilated. - Technically adequate study.  Lexiscan Myoview 08/15/2017:  There was no ST segment deviation noted during stress.  Findings consistent with large anterior/anteroseptal/inferior prior myocardial infarction. Large extensive scarring with current ischemia  The left ventricular ejection fraction is severely decreased (<30%).  This is a high risk study. High risk due to low [name] and extensive scar. No evidence of current ischemia.  Assessment and Plan:  1.  Chronic atrial fibrillation.  Continue current dose of Toprol-XL along with Eliquis.  I reviewed his lab work from December 2018.  2.  Ischemic cardiomyopathy with [name] 25-30% and no large ischemic territories by recent Myoview.  He does not report any angina symptoms and we will plan to continue with current medical regimen.  ARB could be considered but his blood pressure is low normal and he is doing well clinically at this time.  3.  CAD with previous LAD angioplasty in 1992 as well as BMS to the RCA in  2004.  He continues on statin therapy.  4.  CKD stage II, last creatinine 1.1.  Current medicines were reviewed with the patient today.  Disposition: Follow-up in 6 months, sooner if needed.  Signed, Satira Sark, MD, [facility] 12/31/2017 1:51 PM    [facility] [address] [place], [place] [address] Phone: [phone]; Fax: [phone]

## 2017-12-31 ENCOUNTER — Ambulatory Visit (INDEPENDENT_AMBULATORY_CARE_PROVIDER_SITE_OTHER): Payer: Medicare Other | Admitting: Cardiology

## 2017-12-31 ENCOUNTER — Encounter: Payer: Self-pay | Admitting: Cardiology

## 2017-12-31 VITALS — BP 118/74 | HR 76 | Ht 67.0 in | Wt 167.0 lb

## 2017-12-31 DIAGNOSIS — I482 Chronic atrial fibrillation, unspecified: Secondary | ICD-10-CM

## 2017-12-31 DIAGNOSIS — N182 Chronic kidney disease, stage 2 (mild): Secondary | ICD-10-CM | POA: Diagnosis not present

## 2017-12-31 DIAGNOSIS — I255 Ischemic cardiomyopathy: Secondary | ICD-10-CM | POA: Diagnosis not present

## 2017-12-31 DIAGNOSIS — I2510 Atherosclerotic heart disease of native coronary artery without angina pectoris: Secondary | ICD-10-CM | POA: Diagnosis not present

## 2017-12-31 NOTE — Patient Instructions (Signed)
Your physician wants you to follow-up in: 6 months with Dr Ferne Reus will receive a reminder letter in the mail two months in advance. If you don't receive a letter, please call our office to schedule the follow-up appointment.   Your physician recommends that you continue on your current medications as directed. Please refer to the Current Medication list given to you today.    If you need a refill on your cardiac medications before your next appointment, please call your pharmacy.      No tests ordered today       Thank you for choosing [facility] !

## 2018-01-28 DIAGNOSIS — X32XXXD Exposure to sunlight, subsequent encounter: Secondary | ICD-10-CM | POA: Diagnosis not present

## 2018-01-28 DIAGNOSIS — L570 Actinic keratosis: Secondary | ICD-10-CM | POA: Diagnosis not present

## 2018-02-20 ENCOUNTER — Encounter: Payer: Self-pay | Admitting: Family Medicine

## 2018-02-20 ENCOUNTER — Ambulatory Visit (INDEPENDENT_AMBULATORY_CARE_PROVIDER_SITE_OTHER): Payer: Medicare Other | Admitting: Family Medicine

## 2018-02-20 VITALS — BP 125/77 | HR 94 | Temp 97.8°F | Ht 67.0 in | Wt 167.8 lb

## 2018-02-20 DIAGNOSIS — I255 Ischemic cardiomyopathy: Secondary | ICD-10-CM | POA: Diagnosis not present

## 2018-02-20 DIAGNOSIS — E7800 Pure hypercholesterolemia, unspecified: Secondary | ICD-10-CM

## 2018-02-20 DIAGNOSIS — I10 Essential (primary) hypertension: Secondary | ICD-10-CM

## 2018-02-20 DIAGNOSIS — Z23 Encounter for immunization: Secondary | ICD-10-CM

## 2018-02-20 NOTE — Patient Instructions (Signed)
Great to see you!  Follow up with Dr. Darnell Level in 4 months

## 2018-02-20 NOTE — Progress Notes (Signed)
   HPI  Patient presents today for follow-up chronic medical conditions.  Patient feels well, no complaints. Good medication compliance and tolerance.  He watches his diet moderately. Hypertension No headaches or chest pain.  We discussed the tetanus vaccine, he would like to get that done today  PMH: Smoking status noted ROS: Per HPI  Objective: BP 125/77   Pulse 94   Temp 97.8 F (36.6 C) (Oral)   Ht '5\' 7"'$  (1.702 m)   Wt 167 lb 12.8 oz (76.1 kg)   BMI 26.28 kg/m   Gen: NAD, alert, cooperative with exam HEENT: NCAT CV: RRR, good S1/S2 Resp: CTABL, no wheezes, non-labored Abd: SNTND, BS present, no guarding or organomegaly Ext: No edema, warm Neuro: Alert and oriented, No gross deficits  Assessment and plan:  # Hypertension Well-controlled Continue beta-blocker plus Aldactone Labs    hypercholesterolemia LDL goal less than 70 considering CAD.  Need for tetanus vaccine-she will consider, cost and benefits and risks discussed    Orders Placed This Encounter  Procedures  . Lipid panel  . CMP14+EGFR  . CBC with [place], MD [facility] 02/20/2018, 8:50 AM

## 2018-02-21 ENCOUNTER — Encounter: Payer: Self-pay | Admitting: Family Medicine

## 2018-02-21 LAB — CBC WITH DIFFERENTIAL/PLATELET
Basophils Absolute: 0 10*3/uL (ref 0.0–0.2)
Basos: 1 %
EOS (ABSOLUTE): 0.1 10*3/uL (ref 0.0–0.4)
EOS: 2 %
Hematocrit: 47.5 % (ref 37.5–51.0)
Hemoglobin: 16.5 g/dL (ref 13.0–17.7)
Immature Grans (Abs): 0 10*3/uL (ref 0.0–0.1)
Immature Granulocytes: 0 %
LYMPHS ABS: 1.7 10*3/uL (ref 0.7–3.1)
Lymphs: 27 %
MCH: 33.7 pg — AB (ref 26.6–33.0)
MCHC: 34.7 g/dL (ref 31.5–35.7)
MCV: 97 fL (ref 79–97)
MONOS ABS: 0.5 10*3/uL (ref 0.1–0.9)
Monocytes: 8 %
NEUTROS ABS: 4 10*3/uL (ref 1.4–7.0)
NEUTROS PCT: 62 %
PLATELETS: 167 10*3/uL (ref 150–379)
RBC: 4.9 x10E6/uL (ref 4.14–5.80)
RDW: 13.7 % (ref 12.3–15.4)
WBC: 6.3 10*3/uL (ref 3.4–10.8)

## 2018-02-21 LAB — CMP14+EGFR
A/G RATIO: 1.7 (ref 1.2–2.2)
ALBUMIN: 4 g/dL (ref 3.5–4.7)
ALT: 16 IU/L (ref 0–44)
AST: 16 IU/L (ref 0–40)
Alkaline Phosphatase: 49 IU/L (ref 39–117)
BUN / CREAT RATIO: 16 (ref 10–24)
BUN: 17 mg/dL (ref 8–27)
Bilirubin Total: 0.6 mg/dL (ref 0.0–1.2)
CALCIUM: 9.1 mg/dL (ref 8.6–10.2)
CO2: 27 mmol/L (ref 20–29)
Chloride: 101 mmol/L (ref 96–106)
Creatinine, Ser: 1.09 mg/dL (ref 0.76–1.27)
GFR calc Af Amer: 72 mL/min/{1.73_m2} (ref >59)
GFR calc non Af Amer: 62 mL/min/{1.73_m2} (ref >59)
GLOBULIN, TOTAL: 2.4 g/dL (ref 1.5–4.5)
Glucose: 82 mg/dL (ref 65–99)
POTASSIUM: 4.2 mmol/L (ref 3.5–5.2)
SODIUM: 143 mmol/L (ref 134–144)
Total Protein: 6.4 g/dL (ref 6.0–8.5)

## 2018-02-21 LAB — LIPID PANEL
CHOLESTEROL TOTAL: 136 mg/dL (ref 100–199)
Chol/HDL Ratio: 2.7 ratio (ref 0.0–5.0)
HDL: 50 mg/dL (ref >39)
LDL Calculated: 71 mg/dL (ref 0–99)
TRIGLYCERIDES: 74 mg/dL (ref 0–149)
VLDL Cholesterol Cal: 15 mg/dL (ref 5–40)

## 2018-02-27 ENCOUNTER — Other Ambulatory Visit: Payer: Self-pay | Admitting: Family Medicine

## 2018-03-20 ENCOUNTER — Other Ambulatory Visit: Payer: Self-pay | Admitting: Family Medicine

## 2018-04-08 ENCOUNTER — Telehealth: Payer: Self-pay

## 2018-04-08 NOTE — Telephone Encounter (Signed)
Son called to say father had insomnia and was concerned it was because of his dx of heart failure.I see he is diagnosed with sleep apnea and is on C-PAP.Son states that after pt's wife died a few years ago, he quit wearing the machine.I explained this could have direct correlation to his insomnia and encourage him to have his father resume using C-PAP. He will speak with father regarding this.

## 2018-05-05 ENCOUNTER — Other Ambulatory Visit: Payer: Self-pay | Admitting: Family Medicine

## 2018-05-08 IMAGING — NM NM MYOCAR MULTI W/SPECT W/WALL MOTION & EF
2 series · 12 of 12 positions shown · non-contrast
Comparison: none

[Series 1: rest · 6.51mm/px · 6 of 64 frames shown]
[frame 6/64]
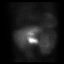
[frame 16/64]
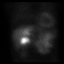
[frame 27/64]
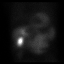
[frame 38/64]
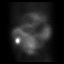
[frame 48/64]
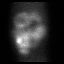
[frame 59/64]
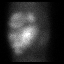

[Series 2: stress gated · 6.51mm/px · 6 of 64 frames shown]
[frame 6/64]
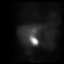
[frame 16/64]
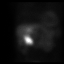
[frame 27/64]
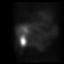
[frame 38/64]
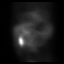
[frame 48/64]
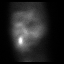
[frame 59/64]
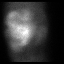

[12 of 12 positions shown; findings below may reference images not displayed]

Canned report from images found in remote index.

Refer to host system for actual result text.

## 2018-06-23 ENCOUNTER — Encounter: Payer: Self-pay | Admitting: Family Medicine

## 2018-06-23 ENCOUNTER — Ambulatory Visit (INDEPENDENT_AMBULATORY_CARE_PROVIDER_SITE_OTHER): Payer: Medicare Other | Admitting: Family Medicine

## 2018-06-23 VITALS — BP 117/77 | HR 80 | Temp 96.9°F | Ht 67.0 in | Wt 164.0 lb

## 2018-06-23 DIAGNOSIS — I482 Chronic atrial fibrillation, unspecified: Secondary | ICD-10-CM

## 2018-06-23 DIAGNOSIS — E7800 Pure hypercholesterolemia, unspecified: Secondary | ICD-10-CM | POA: Diagnosis not present

## 2018-06-23 DIAGNOSIS — I10 Essential (primary) hypertension: Secondary | ICD-10-CM

## 2018-06-23 DIAGNOSIS — Z7901 Long term (current) use of anticoagulants: Secondary | ICD-10-CM

## 2018-06-23 MED ORDER — PANTOPRAZOLE SODIUM 40 MG PO TBEC: 40.0000 mg | DELAYED_RELEASE_TABLET | Freq: Two times a day (BID) | ORAL | 1 refills | Status: DC

## 2018-06-23 MED ORDER — ESCITALOPRAM OXALATE 5 MG PO TABS: 5.0000 mg | ORAL_TABLET | Freq: Every day | ORAL | 1 refills | Status: DC

## 2018-06-23 MED ORDER — LOVASTATIN 40 MG PO TABS: 40.0000 mg | ORAL_TABLET | Freq: Every day | ORAL | 1 refills | Status: DC

## 2018-06-23 MED ORDER — DOXAZOSIN MESYLATE 4 MG PO TABS: 4.0000 mg | ORAL_TABLET | Freq: Every day | ORAL | 1 refills | Status: DC

## 2018-06-23 MED ORDER — APIXABAN 5 MG PO TABS: 5.0000 mg | ORAL_TABLET | Freq: Two times a day (BID) | ORAL | 1 refills | Status: DC

## 2018-06-23 NOTE — Patient Instructions (Signed)
Follow up in 4-6 months for recheck blood pressure.

## 2018-06-23 NOTE — Progress Notes (Signed)
Subjective: CC: HTN, GERD, AFib PCP: Timmothy Euler, MD [id number] Henry Ayala is a 84 y.o. male presenting to clinic today for:  1. Hypertension w/ HLD and Afib on chronic anticoagulation Patient reports compliance with medications.  He is prescribed metoprolol 25 mg XL, Cardura 4 mg daily, Eliquis 5 mg Henry.o. twice daily and spironolactone 25 mg daily.  He is on Mevaor for lipid control.  He is followed by Dr. Domenic Polite, whom he has an appointment with in the next week, Side effects: None.  He does note occasional scant blood in stool.  He saw gastroenterology last year and was told that as long as FIT tests were negative, no colonoscopies would be needed.  He has a history of polyps which required removal on previous colonoscopies.  Denies headache, dizziness, visual changes, nausea, vomiting, chest pain, LE swelling, abdominal pain or shortness of breath.  No blood in urine.  2.  GERD Patient reports compliance with Protonix Henry.o. twice daily.  FIT testing as above.  Patient denies any nausea, vomiting.    ROS: Per HPI  Allergies  Allergen Reactions  . Iodinated Diagnostic Agents     unknown  . Lipitor [Atorvastatin Calcium]     unknown  . Sulfa Drugs Cross Reactors     unknown   Past Medical History:  Diagnosis Date  . Anteroseptal myocardial infarction [facility])    PTCA to LAD 1992  . BPH (benign prostatic hyperplasia)   . CAD (coronary artery disease)    PTCA to LAD 1992, BMS to RCA 2004  . Chronic atrial fibrillation ([facility])   . Essential hypertension   . GERD (gastroesophageal reflux disease)   . GI bleed    March 2017 - esophagitis  . Hiatal hernia   . Hyperlipidemia   . Ischemic cardiomyopathy    [name] 48% 2015  . Myocardial infarction, inferior wall ([name])    BMS to proximal RCA 2004 at [facility]  . Nephrolithiasis   . Sleep apnea    On CPAP    Current Outpatient Medications:  .  alum & mag hydroxide-simeth (MAALOX/MYLANTA) 200-200-20 MG/5ML suspension, Take 30 mLs  by mouth every 6 (six) hours as needed for indigestion or heartburn., Disp: 355 mL, Rfl: 2 .  bismuth subsalicylate (PEPTO BISMOL) 262 MG/15ML suspension, Take 30 mLs by mouth every 6 (six) hours as needed for diarrhea or loose stools., Disp: , Rfl:  .  doxazosin (CARDURA) 4 MG tablet, TAKE 1 TABLET BY MOUTH ONCE DAILY, Disp: 90 tablet, Rfl: 0 .  ELIQUIS 5 MG TABS tablet, TAKE 1 TABLET BY MOUTH TWICE DAILY, Disp: 180 tablet, Rfl: 1 .  escitalopram (LEXAPRO) 5 MG tablet, Take 1 tablet (5 mg total) by mouth daily., Disp: 90 tablet, Rfl: 3 .  lovastatin (MEVACOR) 40 MG tablet, TAKE 1 TABLET BY MOUTH ONCE DAILY, Disp: 90 tablet, Rfl: 1 .  metoprolol succinate (TOPROL XL) 25 MG 24 hr tablet, Take 1 tablet (25 mg total) daily by mouth., Disp: 90 tablet, Rfl: 3 .  Multiple Vitamins-Minerals (MULTIVITAMIN WITH MINERALS) tablet, Take 1 tablet by mouth daily., Disp: , Rfl:  .  pantoprazole (PROTONIX) 40 MG tablet, TAKE 1 TABLET BY MOUTH TWICE DAILY, Disp: 180 tablet, Rfl: 0 .  spironolactone (ALDACTONE) 25 MG tablet, Take 1 tablet (25 mg total) by mouth daily., Disp: 90 tablet, Rfl: 3 Social History   Socioeconomic History  . Marital status: Widowed    Spouse name: Not on file  . Number of children: Not on  file  . Years of education: Not on file  . Highest education level: Not on file  Occupational History  . Occupation: retired    Comment: 1999- [employer]  Social Needs  . Financial resource strain: Not on file  . Food insecurity:    Worry: Not on file    Inability: Not on file  . Transportation needs:    Medical: Not on file    Non-medical: Not on file  Tobacco Use  . Smoking status: Never Smoker  . Smokeless tobacco: Never Used  Substance and Sexual Activity  . Alcohol use: Yes    Alcohol/week: 0.0 standard drinks    Comment: occasionally drink  . Drug use: No  . Sexual activity: Not on file  Lifestyle  . Physical activity:    Days per week: Not on file    Minutes per session: Not on  file  . Stress: Not on file  Relationships  . Social connections:    Talks on phone: Not on file    Gets together: Not on file    Attends religious service: Not on file    Active member of club or organization: Not on file    Attends meetings of clubs or organizations: Not on file    Relationship status: Not on file  . Intimate partner violence:    Fear of current or ex partner: Not on file    Emotionally abused: Not on file    Physically abused: Not on file    Forced sexual activity: Not on file  Other Topics Concern  . Not on file  Social History Narrative  . Not on file   Family History  Problem Relation Age of Onset  . Heart attack Father   . Stroke Mother   . Cancer Mother        colon  . Heart attack Brother 59  . Cancer Brother        prostate  . Cancer Sister        colon  . Parkinson's disease Sister   . Cancer Daughter        unknown origin   . GI problems Son     Objective: Office vital signs reviewed. BP 117/77   Pulse 80   Temp (!) 96.9 F (36.1 C) (Oral)   Ht 5\' 7"  (1.702 m)   Wt 164 lb (74.4 kg)   BMI 25.69 kg/m   Physical Examination:  General: Awake, alert, well nourished, No acute distress HEENT: Normal, no carotid bruits. No JVD.  Sclera white. MMM. Cardio: rate controlled but irregularly irregular.  S1S2 heard, no murmurs appreciated Pulm: clear to auscultation bilaterally, no wheezes, rhonchi or rales; normal work of breathing on room air Extremities: warm, well perfused, No edema, cyanosis or clubbing; +2 pulses bilaterally MSK: normal gait and normal station Skin: dry; intact; no rashes or lesions  Assessment/ Plan: 84 y.o. male   1. Chronic atrial fibrillation ([name]) Rate controlled.  No changes to medication regimen.  I refilled all of his medications except for his metoprolol and spironolactone since he has follow-up with cardiology soon.  I suspect that he will continue on his current medication since he is doing well.  2.  Anticoagulant long-term use He does have occasional scant blood on stool.  Likely related to use of Eliquis.  We will continue to monitor closely.  Reasons for return discussed.  3. Benign essential HTN Well-controlled on current regimen.  No changes made.  Refills sent to pharmacy.  4. Hypercholesterolemia Well-controlled per last lipid panel.  Refill sent in for medications.  Meds ordered this encounter  Medications  . doxazosin (CARDURA) 4 MG tablet    Sig: Take 1 tablet (4 mg total) by mouth daily.    Dispense:  90 tablet    Refill:  1  . apixaban (ELIQUIS) 5 MG TABS tablet    Sig: Take 1 tablet (5 mg total) by mouth 2 (two) times daily.    Dispense:  180 tablet    Refill:  1  . escitalopram (LEXAPRO) 5 MG tablet    Sig: Take 1 tablet (5 mg total) by mouth daily.    Dispense:  90 tablet    Refill:  1    Please consider 90 day supplies to promote better adherence  . lovastatin (MEVACOR) 40 MG tablet    Sig: Take 1 tablet (40 mg total) by mouth daily.    Dispense:  90 tablet    Refill:  1  . pantoprazole (PROTONIX) 40 MG tablet    Sig: Take 1 tablet (40 mg total) by mouth 2 (two) times daily.    Dispense:  180 tablet    Refill:  1   Follow up in 4-6 m.  Henry Norlander, DO [place] [phone]

## 2018-06-30 ENCOUNTER — Ambulatory Visit: Payer: Medicare Other | Admitting: Cardiology

## 2018-08-14 NOTE — Progress Notes (Deleted)
Cardiology Office Note  Date: 08/14/2018   ID: [name], DOB 1931-07-16, MRN [id number]  PCP: Timmothy Euler, MD  Primary Cardiologist: Rozann Lesches, MD   No chief complaint on file.   History of Present Illness: Henry Ayala is an 84 y.o. male last seen in February.  We discussed follow-up lab work on [employer].  Past Medical History:  Diagnosis Date  . Anteroseptal myocardial infarction [facility])    PTCA to LAD 1992  . BPH (benign prostatic hyperplasia)   . CAD (coronary artery disease)    PTCA to LAD 1992, BMS to RCA 2004  . Chronic atrial fibrillation ([place])   . Essential hypertension   . GERD (gastroesophageal reflux disease)   . GI bleed    March 2017 - esophagitis  . Hiatal hernia   . Hyperlipidemia   . Ischemic cardiomyopathy    [name] 48% 2015  . Myocardial infarction, inferior wall ([name])    BMS to proximal RCA 2004 [facility]  . Nephrolithiasis   . Sleep apnea    On CPAP    Past Surgical History:  Procedure Laterality Date  . CARDIOVERSION N/A 06/30/2014   Procedure: CARDIOVERSION;  Surgeon: Sanda Klein, MD;  Location: [facility] ENDOSCOPY;  Service: Cardiovascular;  Laterality: N/A;  . ESOPHAGOGASTRODUODENOSCOPY Left 02/14/2016   Procedure: ESOPHAGOGASTRODUODENOSCOPY (EGD);  Surgeon: Wonda Horner, MD;  Location: [facility];  Service: Endoscopy;  Laterality: Left;  . TOTAL KNEE ARTHROPLASTY     right    Current Outpatient Medications  Medication Sig Dispense Refill  . alum & mag hydroxide-simeth (MAALOX/MYLANTA) 200-200-20 MG/5ML suspension Take 30 mLs by mouth every 6 (six) hours as needed for indigestion or heartburn. 355 mL 2  . apixaban (ELIQUIS) 5 MG TABS tablet Take 1 tablet (5 mg total) by mouth 2 (two) times daily. 180 tablet 1  . bismuth subsalicylate (PEPTO BISMOL) 262 MG/15ML suspension Take 30 mLs by mouth every 6 (six) hours as needed for diarrhea or loose stools.    . doxazosin (CARDURA) 4 MG tablet Take 1 tablet (4 mg total) by  mouth daily. 90 tablet 1  . escitalopram (LEXAPRO) 5 MG tablet Take 1 tablet (5 mg total) by mouth daily. 90 tablet 1  . lovastatin (MEVACOR) 40 MG tablet Take 1 tablet (40 mg total) by mouth daily. 90 tablet 1  . metoprolol succinate (TOPROL XL) 25 MG 24 hr tablet Take 1 tablet (25 mg total) daily by mouth. 90 tablet 3  . Multiple Vitamins-Minerals (MULTIVITAMIN WITH MINERALS) tablet Take 1 tablet by mouth daily.    . pantoprazole (PROTONIX) 40 MG tablet Take 1 tablet (40 mg total) by mouth 2 (two) times daily. 180 tablet 1  . spironolactone (ALDACTONE) 25 MG tablet Take 1 tablet (25 mg total) by mouth daily. 90 tablet 3   No current facility-administered medications for this visit.    Allergies:  Iodinated diagnostic agents; Lipitor [atorvastatin calcium]; and Sulfa drugs cross reactors   Social History: The patient  reports that he has never smoked. He has never used smokeless tobacco. He reports that he drinks alcohol. He reports that he does not use drugs.   Family History: The patient's family history includes Cancer in his brother, daughter, mother, and sister; GI problems in his son; Heart attack in his father; Heart attack (age of onset: 33) in his brother; Parkinson's disease in his sister; Stroke in his mother.   ROS:  Please see the history of present illness. Otherwise, complete review  of systems is positive for {NONE DEFAULTED:18576::"none"}.  All other systems are reviewed and negative.   Physical Exam: VS:  There were no vitals taken for this visit., BMI There is no height or weight on file to calculate BMI.  Wt Readings from Last 3 Encounters:  06/23/18 164 lb (74.4 kg)  02/20/18 167 lb 12.8 oz (76.1 kg)  12/31/17 167 lb (75.8 kg)    General: Patient appears comfortable at rest. HEENT: Conjunctiva and lids normal, oropharynx clear with moist mucosa. Neck: Supple, no elevated JVP or carotid bruits, no thyromegaly. Lungs: Clear to auscultation, nonlabored breathing at  rest. Cardiac: Regular rate and rhythm, no S3 or significant systolic murmur, no pericardial rub. Abdomen: Soft, nontender, no hepatomegaly, bowel sounds present, no guarding or rebound. Extremities: No pitting edema, distal pulses 2+. Skin: Warm and dry. Musculoskeletal: No kyphosis. Neuropsychiatric: Alert and oriented x3, affect grossly appropriate.  ECG: I personally reviewed the tracing from 07/30/2017 which showed atrial fibrillation with PVC, IVCD, and old anterior infarct pattern.  Recent Labwork: 02/20/2018: ALT 16; AST 16; BUN 17; Creatinine, Ser 1.09; Hemoglobin 16.5; Platelets 167; Potassium 4.2; Sodium 143     Component Value Date/Time   CHOL 136 02/20/2018 0855   TRIG 74 02/20/2018 0855   HDL 50 02/20/2018 0855   CHOLHDL 2.7 02/20/2018 0855   CHOLHDL 2.5 12/16/2015 0931   VLDL 19 12/16/2015 0931   LDLCALC 71 02/20/2018 0855    Other Studies Reviewed Today:  Echocardiogram 08/15/2017: Study Conclusions  - Left ventricle: The cavity size was moderately dilated. Wall thickness was normal. Systolic function was severely reduced. The estimated ejection fraction was in the range of 25% to 30%. Diffuse hypokinesis. The study is not technically sufficient to allow evaluation of LV diastolic function. - Regional wall motion abnormality: Akinesis of the mid anterior and basal-mid anteroseptal myocardium. - Aortic valve: There was moderate regurgitation. Valve area (VTI): 2.24 cm^2. Valve area (Vmax): 2.34 cm^2. Valve area (Vmean): 2.48 cm^2. - Mitral valve: Mildly calcified annulus. Normal thickness leaflets . There was mild to moderate regurgitation. The MR VC is 0.4 cm. - Left atrium: The atrium was severely dilated. - Right ventricle: Systolic function was mildly reduced. - Right atrium: The atrium was mildly dilated. - Technically adequate study.  Lexiscan Myoview 08/15/2017:  There was no ST segment deviation noted during stress.  Findings  consistent with large anterior/anteroseptal/inferior prior myocardial infarction. Large extensive scarring with current ischemia  The left ventricular ejection fraction is severely decreased (<30%).  This is a high risk study. High risk due to low [name] and extensive scar. No evidence of current ischemia.  Assessment and Plan:    Current medicines were reviewed with the patient today.  No orders of the defined types were placed in this encounter.   Disposition:  Signed, Satira Sark, MD, [facility] 08/14/2018 10:48 AM    [facility] [place], [place], [place] [address] Phone: [phone]; Fax: [phone]

## 2018-08-15 ENCOUNTER — Ambulatory Visit: Payer: Medicare Other | Admitting: Cardiology

## 2018-09-15 ENCOUNTER — Ambulatory Visit (INDEPENDENT_AMBULATORY_CARE_PROVIDER_SITE_OTHER): Payer: Medicare Other

## 2018-09-15 DIAGNOSIS — Z23 Encounter for immunization: Secondary | ICD-10-CM

## 2018-10-03 ENCOUNTER — Encounter: Payer: Self-pay | Admitting: Family Medicine

## 2018-10-03 ENCOUNTER — Ambulatory Visit (INDEPENDENT_AMBULATORY_CARE_PROVIDER_SITE_OTHER): Payer: Medicare Other

## 2018-10-03 ENCOUNTER — Ambulatory Visit (INDEPENDENT_AMBULATORY_CARE_PROVIDER_SITE_OTHER): Payer: Medicare Other | Admitting: Family Medicine

## 2018-10-03 VITALS — BP 129/66 | HR 67 | Temp 97.1°F | Ht 67.0 in | Wt 166.0 lb

## 2018-10-03 DIAGNOSIS — M25561 Pain in right knee: Secondary | ICD-10-CM

## 2018-10-03 DIAGNOSIS — W19XXXA Unspecified fall, initial encounter: Secondary | ICD-10-CM | POA: Diagnosis not present

## 2018-10-03 DIAGNOSIS — S8001XA Contusion of right knee, initial encounter: Secondary | ICD-10-CM | POA: Diagnosis not present

## 2018-10-03 DIAGNOSIS — S8991XA Unspecified injury of right lower leg, initial encounter: Secondary | ICD-10-CM | POA: Diagnosis not present

## 2018-10-03 NOTE — Patient Instructions (Signed)
He had an x-ray obtained today to rule out any fractures of the tibia. The large soft tissue swelling and bruise is a hematoma.  This is likely related to your use of anticoagulation.  As we discussed, I want you to continue the blood thinner as the risk of complications of a hematoma is lower than the risk of foregoing anticoagulation with atrial fibrillation. Keep the knee elevated, apply ice.  You may use a knee compression sleeve if you desire.  If symptoms worsen, you develop significant pain or inability to ambulate or move the knee, please return for reevaluation.  Otherwise, anticipate this should resolve within the next couple of weeks.  Hematoma A hematoma is a collection of blood under the skin, in an organ, in a body space, in a joint space, or in other tissue. The blood can thicken (clot) to form a lump that you can see and feel. The lump is often firm and may become sore and tender. Most hematomas get better in a few days to weeks. However, some hematomas may be serious and require medical care. Hematomas can range from very small to very large. What are the causes? This condition is caused by:  A blunt or penetrating injury.  A leakage from a blood vessel under the skin. This leak happens on its own (is spontaneous) and is more likely to occur in older people, especially those who take blood thinners.  Some medical procedures including surgeries, such as oral surgery, face lifts, and surgeries that involve the joints.  Some medical conditions that cause bleeding or bruising problems. There may be multiple hematomas that appear in different areas of the body.  What are the signs or symptoms? Symptoms of this condition can depend on where the hematoma is located. Common symptoms of a hematoma under the skin include:  A firm lump on the body.  Pain and tenderness in the area.  Bruising. Blue, dark blue, purple-red, or yellowish skin (discoloration) may appear at the site of the  hematoma if the hematoma is close to the surface of the skin.  For hematomas in deeper tissues or body spaces, symptoms may be less obvious. A collection of blood in the stomach (intra-abdominal hematoma) may cause pain in the abdomen, weakness, fainting, and shortness of breath. A collection of blood in the head (intracranial hematoma) may cause a headache or symptoms such as weakness, trouble speaking or understanding, or a change in consciousness. How is this diagnosed? This condition is diagnosed based on:  Your medical history.  A physical exam.  Imaging tests, such as an ultrasonogram or CT scan. These may be needed if your health care provider suspects a hematoma in deeper tissues or body spaces.  Blood tests. These may be needed if your health care provider believes that the hematoma is caused by a medical condition.  How is this treated? This condition usually does not need treatment because many hematomas go away on their own over time. However, large hematomas, or those that may affect vital organs, may need surgical drainage or monitoring. If the hematoma is caused by a medical condition, medicines may be prescribed. Follow these instructions at home: Managing pain, stiffness, and swelling  If directed, apply ice to the affected area: ? Put ice in a plastic bag. ? Place a towel between your skin and the bag. ? Leave the ice on for 20 minutes, 2-3 times a day for the first couple of days.  After applying ice for a couple of  days, your health care provider may recommend that you apply warm compresses to the affected area instead. Do this as told by your health care provider. Remove the heat if your skin turns bright red. This is especially important if you are unable to feel pain, heat, or cold. You may have a greater risk of getting burned  Raise (elevate) the affected area above the level of your heart while you are sitting or lying down.  Wrap the affected area with an  elastic bandage, if told by your health care provider. The bandage applies pressure (compression) to the area, which may help to reduce swelling and help the hematoma heal. Make sure the bandage is not wrapped too tight.  If your hematoma is on a leg or foot (lower extremity) and is painful, your health care provider may recommend crutches. Use them as told by your health care provider. General instructions  Take over-the-counter and prescription medicines only as told by your health care provider.  Keep all follow-up visits as told by your health care provider. This is important. Contact a health care provider if:  You have a fever.  The swelling or discoloration gets worse.  You develop more hematomas. Get help right away if:  Your pain is worse or your pain is not controlled with medicine.  Your skin over the hematoma breaks or starts bleeding.  Your hematoma is in your chest or abdomen and you have weakness, shortness of breath, or a change in consciousness.  You have a hematoma on your scalp caused by a fall or injury and you have a headache that gets worse, trouble speaking or understanding, weakness, or a change in alertness or consciousness. Summary  A hematoma is a collection of blood under the skin, in an organ, in a body space, in a joint space, or in other tissue.  This condition usually does not need treatment because many hematomas go away on their own over time.  Large hematomas, or those that may affect vital organs, may need surgical drainage or monitoring. If the hematoma is caused by a medical condition, medicines may be prescribed. This information is not intended to replace advice given to you by your health care provider. Make sure you discuss any questions you have with your health care provider. Document Released: 06/12/2004 Document Revised: 12/01/2016 Document Reviewed: 12/01/2016 Elsevier Interactive Patient Education  2018 [employer].

## 2018-10-03 NOTE — Progress Notes (Signed)
Subjective: CC: fall PCP: [name], DO YFV:CBSWHQ P Henry Ayala is a 84 y.o. male presenting to clinic today for:  1. Fall Patient sustained a mechanical fall yesterday afternoon.  He landed on the right knee.  He denies any immediate bruising or swelling after the fall.  He woke up this morning and had a large knot on the lateral aspect of the right knee.  He notes some decreased extension but is able to ambulate independently.  No significant pain with ambulation.  He is on a blood thinner and wanted to get checked out today.   ROS: Per HPI  Allergies  Allergen Reactions  . Iodinated Diagnostic Agents     unknown  . Lipitor [Atorvastatin Calcium]     unknown  . Sulfa Drugs Cross Reactors     unknown   Past Medical History:  Diagnosis Date  . Anteroseptal myocardial infarction [facility])    PTCA to LAD 1992  . BPH (benign prostatic hyperplasia)   . CAD (coronary artery disease)    PTCA to LAD 1992, BMS to RCA 2004  . Chronic atrial fibrillation ([place])   . Essential hypertension   . GERD (gastroesophageal reflux disease)   . GI bleed    March 2017 - esophagitis  . Hiatal hernia   . Hyperlipidemia   . Ischemic cardiomyopathy    [name] 48% 2015  . Myocardial infarction, inferior wall ([name])    BMS to proximal RCA 2004 at [facility]  . Nephrolithiasis   . Sleep apnea    On CPAP    Current Outpatient Medications:  .  alum & mag hydroxide-simeth (MAALOX/MYLANTA) 200-200-20 MG/5ML suspension, Take 30 mLs by mouth every 6 (six) hours as needed for indigestion or heartburn., Disp: 355 mL, Rfl: 2 .  apixaban (ELIQUIS) 5 MG TABS tablet, Take 1 tablet (5 mg total) by mouth 2 (two) times daily., Disp: 180 tablet, Rfl: 1 .  bismuth subsalicylate (PEPTO BISMOL) 262 MG/15ML suspension, Take 30 mLs by mouth every 6 (six) hours as needed for diarrhea or loose stools., Disp: , Rfl:  .  doxazosin (CARDURA) 4 MG tablet, Take 1 tablet (4 mg total) by mouth daily., Disp: 90 tablet, Rfl: 1 .   escitalopram (LEXAPRO) 5 MG tablet, Take 1 tablet (5 mg total) by mouth daily., Disp: 90 tablet, Rfl: 1 .  lovastatin (MEVACOR) 40 MG tablet, Take 1 tablet (40 mg total) by mouth daily., Disp: 90 tablet, Rfl: 1 .  metoprolol succinate (TOPROL XL) 25 MG 24 hr tablet, Take 1 tablet (25 mg total) daily by mouth., Disp: 90 tablet, Rfl: 3 .  Multiple Vitamins-Minerals (MULTIVITAMIN WITH MINERALS) tablet, Take 1 tablet by mouth daily., Disp: , Rfl:  .  pantoprazole (PROTONIX) 40 MG tablet, Take 1 tablet (40 mg total) by mouth 2 (two) times daily., Disp: 180 tablet, Rfl: 1 .  spironolactone (ALDACTONE) 25 MG tablet, Take 1 tablet (25 mg total) by mouth daily., Disp: 90 tablet, Rfl: 3 Social History   Socioeconomic History  . Marital status: Widowed    Spouse name: Not on file  . Number of children: Not on file  . Years of education: Not on file  . Highest education level: Not on file  Occupational History  . Occupation: retired    Comment: 1999- [employer]  Social Needs  . Financial resource strain: Not on file  . Food insecurity:    Worry: Not on file    Inability: Not on file  . Transportation needs:  Medical: Not on file    Non-medical: Not on file  Tobacco Use  . Smoking status: Never Smoker  . Smokeless tobacco: Never Used  Substance and Sexual Activity  . Alcohol use: Yes    Alcohol/week: 0.0 standard drinks    Comment: occasionally drink  . Drug use: No  . Sexual activity: Not on file  Lifestyle  . Physical activity:    Days per week: Not on file    Minutes per session: Not on file  . Stress: Not on file  Relationships  . Social connections:    Talks on phone: Not on file    Gets together: Not on file    Attends religious service: Not on file    Active member of club or organization: Not on file    Attends meetings of clubs or organizations: Not on file    Relationship status: Not on file  . Intimate partner violence:    Fear of current or ex partner: Not on file     Emotionally abused: Not on file    Physically abused: Not on file    Forced sexual activity: Not on file  Other Topics Concern  . Not on file  Social History Narrative  . Not on file   Family History  Problem Relation Age of Onset  . Heart attack Father   . Stroke Mother   . Cancer Mother        colon  . Heart attack Brother 101  . Cancer Brother        prostate  . Cancer Sister        colon  . Parkinson's disease Sister   . Cancer Daughter        unknown origin   . GI problems Son     Objective: Office vital signs reviewed. BP 129/66   Pulse 67   Temp (!) 97.1 F (36.2 C) (Oral)   Ht 5\' 7"  (1.702 m)   Wt 166 lb (75.3 kg)   BMI 26.00 kg/m   Physical Examination:  General: Awake, alert, well nourished, No acute distress Extremities: warm, well perfused, No edema, cyanosis or clubbing; +2 pulses bilaterally MSK: minimally antalgic gait and station  Right knee: Has about a 10 degree loss of extension.  Flexion is preserved.  He has a golf ball sized hematoma with associated soft tissue swelling along the lateral aspect of the anterior knee.  No tenderness to palpation of the patella, patellar tendon or quad tendon.  He does have tenderness to palpation of the lateral joint line.  No ligamentous laxity.  No palpable posterior popliteal masses or TTP. Skin: ecchymosis and swelling as above.  Well-healed postop scar noted. Neuro: light touch sensation grossly in tact  No results found.   Assessment/ Plan: 84 y.o. male   1. Fall, initial encounter Mechanical. - DG Knee 1-2 Views Right; Future  2. Acute pain of right knee Large hematoma noted on exam. - DG Knee 1-2 Views Right; Future  3. Traumatic hematoma of right knee, initial encounter Not impairing his ability to ambulate.  Not causing significant pain.  X-rays were obtained as above to rule out any complications given history of knee replacement.  He had tenderness to palpation along the lateral joint line  today.  No appreciable fracture noted on personal view of the x-ray.  Hardware was visible as was the large hematoma.  I am awaiting formal review by radiology.  Home care instruction reviewed with patient.  Reasons for  return discussed.  For now, recommended supportive care, rest, ice, elevation and compression.  Avoid NSAIDs given anticoagulation but may use Tylenol if needed for any pain.    Orders Placed This Encounter  Procedures  . DG Knee 1-2 Views Right    Standing Status:   Future    Number of Occurrences:   1    Standing Expiration Date:   12/03/2019    Order Specific Question:   Reason for Exam (SYMPTOM  OR DIAGNOSIS REQUIRED)    Answer:   fall on blood thinner.  pain along right lateral joint line.    Order Specific Question:   Preferred imaging location?    Answer:   Internal   No orders of the defined types were placed in this encounter.    [name], DO [place] [phone]

## 2018-10-03 NOTE — Progress Notes (Signed)
[id number] 

## 2018-10-06 NOTE — Progress Notes (Signed)
Cardiology Office Note  Date: 10/07/2018   ID: Henry Ayala, DOB 08/22/1931, MRN [id number]  PCP: [name], DO  Primary Cardiologist: Rozann Lesches, MD   Chief Complaint  Patient presents with  . Atrial Fibrillation    History of Present Illness: Henry Ayala is an 84 y.o. male last seen in February.  He is here today for a follow-up visit.  He does not report any chest pain or palpitations.  He did have a mechanical fall while at a restaurant, stepped on an uneven surface, bruised his right knee but no other major injuries.  Continues to follow at [facility].  Last lab work was in April, he is due for follow-up CBC and BMET on Eliquis.  He does not report any recent changes in stool.  I personally reviewed his ECG today which shows rate controlled atrial fibrillation with IVCD and old inferolateral infarct pattern.  He is on Toprol-XL for heart rate control.  Past Medical History:  Diagnosis Date  . Anteroseptal myocardial infarction [facility])    PTCA to LAD 1992  . BPH (benign prostatic hyperplasia)   . CAD (coronary artery disease)    PTCA to LAD 1992, BMS to RCA 2004  . Chronic atrial fibrillation   . Essential hypertension   . GERD (gastroesophageal reflux disease)   . GI bleed    March 2017 - esophagitis  . Hiatal hernia   . Hyperlipidemia   . Ischemic cardiomyopathy    [name] 48% 2015  . Myocardial infarction, inferior wall ([name])    [facility]MS to proximal RCA [facility] at [facility]  . Nephrolithiasis   . Sleep apnea    On CPAP    Past Surgical History:  Procedure Laterality Date  . CARDIOVERSION N/A 06/30/2014   Procedure: CARDIOVERSION;  Surgeon: Sanda Klein, MD;  Location: [facility] ENDOSCOPY;  Service: Cardiovascular;  Laterality: N/A;  . ESOPHAGOGASTRODUODENOSCOPY Left 02/14/2016   Procedure: ESOPHAGOGASTRODUODENOSCOPY (EGD);  Surgeon: Wonda Horner, MD;  Location: [facility];  Service: Endoscopy;  Laterality: Left;  . TOTAL KNEE ARTHROPLASTY     right     Current Outpatient Medications  Medication Sig Dispense Refill  . alum & mag hydroxide-simeth (MAALOX/MYLANTA) 200-200-20 MG/5ML suspension Take 30 mLs by mouth every 6 (six) hours as needed for indigestion or heartburn. 355 mL 2  . apixaban (ELIQUIS) 5 MG TABS tablet Take 1 tablet (5 mg total) by mouth 2 (two) times daily. 180 tablet 1  . bismuth subsalicylate (PEPTO BISMOL) 262 MG/15ML suspension Take 30 mLs by mouth every 6 (six) hours as needed for diarrhea or loose stools.    . doxazosin (CARDURA) 4 MG tablet Take 1 tablet (4 mg total) by mouth daily. 90 tablet 1  . escitalopram (LEXAPRO) 5 MG tablet Take 1 tablet (5 mg total) by mouth daily. 90 tablet 1  . lovastatin (MEVACOR) 40 MG tablet Take 1 tablet (40 mg total) by mouth daily. 90 tablet 1  . metoprolol succinate (TOPROL XL) 25 MG 24 hr tablet Take 1 tablet (25 mg total) daily by mouth. 90 tablet 3  . Multiple Vitamins-Minerals (MULTIVITAMIN WITH MINERALS) tablet Take 1 tablet by mouth daily.    . pantoprazole (PROTONIX) 40 MG tablet Take 1 tablet (40 mg total) by mouth 2 (two) times daily. 180 tablet 1  . spironolactone (ALDACTONE) 25 MG tablet Take 1 tablet (25 mg total) by mouth daily. 90 tablet 3   No current facility-administered medications for this visit.    Allergies:  Iodinated diagnostic agents; Lipitor [atorvastatin calcium]; and Sulfa drugs cross reactors   Social History: The patient  reports that he has never smoked. He has never used smokeless tobacco. He reports that he drinks alcohol. He reports that he does not use drugs.   ROS:  Please see the history of present illness. Otherwise, complete review of systems is positive for hearing loss, arthritic stiffness.  All other systems are reviewed and negative.   Physical Exam: VS:  BP 115/76   Pulse 93   Ht 5\' 7"  (1.702 m)   Wt 167 lb (75.8 kg)   SpO2 96%   BMI 26.16 kg/m , BMI Body mass index is 26.16 kg/m.  Wt Readings from Last 3 Encounters:  10/07/18  167 lb (75.8 kg)  10/03/18 166 lb (75.3 kg)  06/23/18 164 lb (74.4 kg)    General: Elderly male, appears comfortable at rest. HEENT: Conjunctiva and lids normal, oropharynx clear. Neck: Supple, no elevated JVP or carotid bruits, no thyromegaly. Lungs: Diminished breath sounds, nonlabored breathing at rest. Cardiac: Irregularly irregular, no S3, 2/6 systolic murmur. Abdomen: Soft, nontender, bowel sounds present. Extremities: No pitting edema, distal pulses 2+. Skin: Warm and dry. Musculoskeletal: No kyphosis. Neuropsychiatric: Alert and oriented x3, affect grossly appropriate.  ECG: I personally reviewed the tracing from 07/30/2017 which showed atrial fibrillation with PVC, IVCD, old anterior infarct pattern.  Recent Labwork: 02/20/2018: ALT 16; AST 16; BUN 17; Creatinine, Ser 1.09; Hemoglobin 16.5; Platelets 167; Potassium 4.2; Sodium 143     Component Value Date/Time   CHOL 136 02/20/2018 0855   TRIG 74 02/20/2018 0855   HDL 50 02/20/2018 0855   CHOLHDL 2.7 02/20/2018 0855   CHOLHDL 2.5 12/16/2015 0931   VLDL 19 12/16/2015 0931   LDLCALC 71 02/20/2018 0855    Other Studies Reviewed Today:  Echocardiogram 08/15/2017: Study Conclusions  - Left ventricle: The cavity size was moderately dilated. Wall thickness was normal. Systolic function was severely reduced. The estimated ejection fraction was in the range of 25% to 30%. Diffuse hypokinesis. The study is not technically sufficient to allow evaluation of LV diastolic function. - Regional wall motion abnormality: Akinesis of the mid anterior and basal-mid anteroseptal myocardium. - Aortic valve: There was moderate regurgitation. Valve area (VTI): 2.24 cm^2. Valve area (Vmax): 2.34 cm^2. Valve area (Vmean): 2.48 cm^2. - Mitral valve: Mildly calcified annulus. Normal thickness leaflets . There was mild to moderate regurgitation. The MR VC is 0.4 cm. - Left atrium: The atrium was severely dilated. - Right  ventricle: Systolic function was mildly reduced. - Right atrium: The atrium was mildly dilated. - Technically adequate study.  Lexiscan Myoview 08/15/2017:  There was no ST segment deviation noted during stress.  Findings consistent with large anterior/anteroseptal/inferior prior myocardial infarction. Large extensive scarring with current ischemia  The left ventricular ejection fraction is severely decreased (<30%).  This is a high risk study. High risk due to low [name] and extensive scar. No evidence of current ischemia.  Assessment and Plan:  1.  Permanent atrial fibrillation.  Continue heart rate control and anticoagulation.  He is on Toprol-XL and Eliquis.  He is due to see PCP soon, needs follow-up CBC and BMET in comparison to April.  2.  Ischemic cardiomyopathy with [name] 25 to 30%.  We are managing him conservatively on medical therapy.  Weight has been stable.  3.  CAD with history of previous LAD angioplasty in 1992 with BMS to the RCA in 2004.  Continue medical therapy including statin.  He is not on aspirin given concurrent use of Eliquis.  4.  CKD stage 2, most recent creatinine 1.09.  Current medicines were reviewed with the patient today.   Orders Placed This Encounter  Procedures  . EKG 12-Lead    Disposition: Follow-up in 6 months.  Signed, Satira Sark, MD, [facility] 10/07/2018 12:02 PM    [place] [facility] [place], [place], [place] [address] Phone: [phone]; Fax: [phone]

## 2018-10-07 ENCOUNTER — Encounter: Payer: Self-pay | Admitting: Cardiology

## 2018-10-07 ENCOUNTER — Ambulatory Visit (INDEPENDENT_AMBULATORY_CARE_PROVIDER_SITE_OTHER): Payer: Medicare Other | Admitting: Cardiology

## 2018-10-07 VITALS — BP 115/76 | HR 93 | Ht 67.0 in | Wt 167.0 lb

## 2018-10-07 DIAGNOSIS — N182 Chronic kidney disease, stage 2 (mild): Secondary | ICD-10-CM | POA: Diagnosis not present

## 2018-10-07 DIAGNOSIS — I4821 Permanent atrial fibrillation: Secondary | ICD-10-CM | POA: Diagnosis not present

## 2018-10-07 DIAGNOSIS — I2510 Atherosclerotic heart disease of native coronary artery without angina pectoris: Secondary | ICD-10-CM | POA: Diagnosis not present

## 2018-10-07 DIAGNOSIS — I255 Ischemic cardiomyopathy: Secondary | ICD-10-CM

## 2018-10-07 NOTE — Patient Instructions (Addendum)

## 2018-10-14 ENCOUNTER — Other Ambulatory Visit: Payer: Self-pay | Admitting: Cardiology

## 2018-12-17 ENCOUNTER — Other Ambulatory Visit: Payer: Self-pay

## 2018-12-17 MED ORDER — SPIRONOLACTONE 25 MG PO TABS: 25.0000 mg | ORAL_TABLET | Freq: Every day | ORAL | 1 refills | Status: DC

## 2018-12-24 ENCOUNTER — Ambulatory Visit (INDEPENDENT_AMBULATORY_CARE_PROVIDER_SITE_OTHER): Payer: Medicare Other | Admitting: Family Medicine

## 2018-12-24 VITALS — BP 119/72 | HR 64 | Temp 96.8°F | Ht 67.0 in | Wt 160.0 lb

## 2018-12-24 DIAGNOSIS — I259 Chronic ischemic heart disease, unspecified: Secondary | ICD-10-CM

## 2018-12-24 DIAGNOSIS — F341 Dysthymic disorder: Secondary | ICD-10-CM

## 2018-12-24 DIAGNOSIS — I10 Essential (primary) hypertension: Secondary | ICD-10-CM | POA: Diagnosis not present

## 2018-12-24 DIAGNOSIS — Z7901 Long term (current) use of anticoagulants: Secondary | ICD-10-CM | POA: Diagnosis not present

## 2018-12-24 DIAGNOSIS — D649 Anemia, unspecified: Secondary | ICD-10-CM

## 2018-12-24 DIAGNOSIS — I482 Chronic atrial fibrillation, unspecified: Secondary | ICD-10-CM | POA: Diagnosis not present

## 2018-12-24 DIAGNOSIS — E7800 Pure hypercholesterolemia, unspecified: Secondary | ICD-10-CM

## 2018-12-24 MED ORDER — ESCITALOPRAM OXALATE 5 MG PO TABS: 5.0000 mg | ORAL_TABLET | Freq: Every day | ORAL | 1 refills | Status: DC

## 2018-12-24 MED ORDER — APIXABAN 5 MG PO TABS: 5.0000 mg | ORAL_TABLET | Freq: Two times a day (BID) | ORAL | 1 refills | Status: DC

## 2018-12-24 MED ORDER — PANTOPRAZOLE SODIUM 40 MG PO TBEC: 40.0000 mg | DELAYED_RELEASE_TABLET | Freq: Two times a day (BID) | ORAL | 1 refills | Status: DC

## 2018-12-24 MED ORDER — DOXAZOSIN MESYLATE 4 MG PO TABS: 4.0000 mg | ORAL_TABLET | Freq: Every day | ORAL | 1 refills | Status: DC

## 2018-12-24 MED ORDER — LOVASTATIN 40 MG PO TABS: 40.0000 mg | ORAL_TABLET | Freq: Every day | ORAL | 1 refills | Status: DC

## 2018-12-24 NOTE — Progress Notes (Signed)
Subjective: CC: Hypertension/hyperlipidemia/coronary artery disease/atrial fibrillation PCP: [name], DO Henry Ayala is a 85 y.o. male presenting to clinic today for:  1.  Hypertension/hyperlipidemia/coronary artery disease/atrial fibrillation Patient was seen by his cardiologist in November and had a good checkup per his daughter's report.  He has a history of permanent atrial fibrillation and is on Toprol and anticoagulated with Eliquis.  He denies any GI bleed, blood in urine or bleeding with brushing teeth.  He is compliant with his lovastatin and Aldactone.  He does not endorse any chest pain, shortness of breath, lower extremity edema or recurrent falls.  He is on Protonix twice daily for history of GI bleed.  2.  Mood Patient with history of depressive disorder, which his daughter notes is well controlled on Lexapro 5 mg daily.   ROS: Per HPI  Allergies  Allergen Reactions  . Iodinated Diagnostic Agents     unknown  . Lipitor [Atorvastatin Calcium]     unknown  . Sulfa Drugs Cross Reactors     unknown   Past Medical History:  Diagnosis Date  . Anteroseptal myocardial infarction [facility])    PTCA to LAD 1992  . BPH (benign prostatic hyperplasia)   . CAD (coronary artery disease)    PTCA to LAD 1992, BMS to RCA 2004  . Chronic atrial fibrillation   . Essential hypertension   . GERD (gastroesophageal reflux disease)   . GI bleed    March 2017 - esophagitis  . Hiatal hernia   . Hyperlipidemia   . Ischemic cardiomyopathy    [name] 48% 2015  . Myocardial infarction, inferior wall ([name])    BMS to proximal RCA 2004 at [facility]  . Nephrolithiasis   . Sleep apnea    On CPAP    Current Outpatient Medications:  .  alum & mag hydroxide-simeth (MAALOX/MYLANTA) 200-200-20 MG/5ML suspension, Take 30 mLs by mouth every 6 (six) hours as needed for indigestion or heartburn., Disp: 355 mL, Rfl: 2 .  apixaban (ELIQUIS) 5 MG TABS tablet, Take 1 tablet (5 mg total) by  mouth 2 (two) times daily., Disp: 180 tablet, Rfl: 1 .  bismuth subsalicylate (PEPTO BISMOL) 262 MG/15ML suspension, Take 30 mLs by mouth every 6 (six) hours as needed for diarrhea or loose stools., Disp: , Rfl:  .  doxazosin (CARDURA) 4 MG tablet, Take 1 tablet (4 mg total) by mouth daily., Disp: 90 tablet, Rfl: 1 .  escitalopram (LEXAPRO) 5 MG tablet, Take 1 tablet (5 mg total) by mouth daily., Disp: 90 tablet, Rfl: 1 .  lovastatin (MEVACOR) 40 MG tablet, Take 1 tablet (40 mg total) by mouth daily., Disp: 90 tablet, Rfl: 1 .  metoprolol succinate (TOPROL-XL) 25 MG 24 hr tablet, TAKE 1 TABLET BY MOUTH ONCE DAILY, Disp: 90 tablet, Rfl: 3 .  Multiple Vitamins-Minerals (MULTIVITAMIN WITH MINERALS) tablet, Take 1 tablet by mouth daily., Disp: , Rfl:  .  pantoprazole (PROTONIX) 40 MG tablet, Take 1 tablet (40 mg total) by mouth 2 (two) times daily., Disp: 180 tablet, Rfl: 1 .  spironolactone (ALDACTONE) 25 MG tablet, Take 1 tablet (25 mg total) by mouth daily., Disp: 90 tablet, Rfl: 1 Social History   Socioeconomic History  . Marital status: Widowed    Spouse name: Not on file  . Number of children: Not on file  . Years of education: Not on file  . Highest education level: Not on file  Occupational History  . Occupation: retired    Comment: [place]  Social Needs  . Financial resource strain: Not on file  . Food insecurity:    Worry: Not on file    Inability: Not on file  . Transportation needs:    Medical: Not on file    Non-medical: Not on file  Tobacco Use  . Smoking status: Never Smoker  . Smokeless tobacco: Never Used  Substance and Sexual Activity  . Alcohol use: Yes    Alcohol/week: 0.0 standard drinks    Comment: occasionally drink  . Drug use: No  . Sexual activity: Not on file  Lifestyle  . Physical activity:    Days per week: Not on file    Minutes per session: Not on file  . Stress: Not on file  Relationships  . Social connections:    Talks on phone: Not on  file    Gets together: Not on file    Attends religious service: Not on file    Active member of club or organization: Not on file    Attends meetings of clubs or organizations: Not on file    Relationship status: Not on file  . Intimate partner violence:    Fear of current or ex partner: Not on file    Emotionally abused: Not on file    Physically abused: Not on file    Forced sexual activity: Not on file  Other Topics Concern  . Not on file  Social History Narrative  . Not on file   Family History  Problem Relation Age of Onset  . Heart attack Father   . Stroke Mother   . Cancer Mother        colon  . Heart attack Brother 59  . Cancer Brother        prostate  . Cancer Sister        colon  . Parkinson's disease Sister   . Cancer Daughter        unknown origin   . GI problems Son     Objective: Office vital signs reviewed. BP 119/72   Pulse 64   Temp (!) 96.8 F (36 C) (Oral)   Ht _0  (1.702 m)   Wt 160 lb (72.6 kg)   BMI 25.06 kg/m   Physical Examination:  General: Awake, alert, well nourished, well appearing elderly male. No acute distress HEENT: Normal. MMM Cardio: irregularly irregular w/ rate control. S1S2 heard, no murmurs appreciated Pulm: clear to auscultation bilaterally, no wheezes, rhonchi or rales; normal work of breathing on room air Extremities: warm, well perfused, No edema, cyanosis or clubbing; +2 pulses bilaterally MSK: slow gait and hunched station Psych: Mood stable, pleasant and interactive Depression screen [facility] 2/9 12/24/2018 10/03/2018 06/23/2018  Decreased Interest 0 0 0  Down, Depressed, Hopeless 0 0 0  PHQ - 2 Score 0 0 0  Altered sleeping 0 0 -  Tired, decreased energy 0 0 -  Change in appetite 0 0 -  Feeling bad or failure about yourself  0 0 -  Trouble concentrating 0 0 -  Moving slowly or fidgety/restless 0 0 -  Suicidal thoughts 0 0 -  PHQ-9 Score 0 0 -  Difficult doing work/chores - Not difficult at all -   Assessment/  Plan: 85 y.o. male   1. Benign essential HTN Under excellent control on current therapies.  No changes made.  Check CMP and lipid panel.  Patient is fasting. - CMP14+EGFR - Lipid Panel  2. Chronic atrial fibrillation Rate controlled.  Continue beta-blocker and chronic  anticoagulation.  Not having any issues with bleeding. - TSH - Lipid Panel  3. Ischemic heart disease - CMP14+EGFR - TSH - Lipid Panel  4. Hypercholesterolemia - CMP14+EGFR - Lipid Panel  5. Anticoagulant long-term use Check CBC - CBC  6. Normocytic anemia - CBC  7. Dysthymia Stable with Lexapro.  Refill has been sent. - TSH   Orders Placed This Encounter  Procedures  . CMP14+EGFR  . TSH  . Lipid Panel  . CBC   Meds ordered this encounter  Medications  . apixaban (ELIQUIS) 5 MG TABS tablet    Sig: Take 1 tablet (5 mg total) by mouth 2 (two) times daily.    Dispense:  180 tablet    Refill:  1  . doxazosin (CARDURA) 4 MG tablet    Sig: Take 1 tablet (4 mg total) by mouth daily.    Dispense:  90 tablet    Refill:  1  . escitalopram (LEXAPRO) 5 MG tablet    Sig: Take 1 tablet (5 mg total) by mouth daily.    Dispense:  90 tablet    Refill:  1    Please consider 90 day supplies to promote better adherence  . lovastatin (MEVACOR) 40 MG tablet    Sig: Take 1 tablet (40 mg total) by mouth daily.    Dispense:  90 tablet    Refill:  1  . pantoprazole (PROTONIX) 40 MG tablet    Sig: Take 1 tablet (40 mg total) by mouth 2 (two) times daily.    Dispense:  180 tablet    Refill:  [place], [place] [phone]

## 2018-12-24 NOTE — Patient Instructions (Signed)
You had labs performed today.  You will be contacted with the results of the labs once they are available, usually in the next 3 business days for routine lab work.    Your medicines have been refilled.

## 2018-12-25 LAB — CMP14+EGFR
ALT: 17 IU/L (ref 0–44)
AST: 21 IU/L (ref 0–40)
Albumin/Globulin Ratio: 2.1 (ref 1.2–2.2)
Albumin: 4.1 g/dL (ref 3.6–4.6)
Alkaline Phosphatase: 52 IU/L (ref 39–117)
BUN/Creatinine Ratio: 15 (ref 10–24)
BUN: 16 mg/dL (ref 8–27)
Bilirubin Total: 0.5 mg/dL (ref 0.0–1.2)
CO2: 27 mmol/L (ref 20–29)
Calcium: 9.4 mg/dL (ref 8.6–10.2)
Chloride: 99 mmol/L (ref 96–106)
Creatinine, Ser: 1.06 mg/dL (ref 0.76–1.27)
GFR calc Af Amer: 74 mL/min/{1.73_m2} (ref >59)
GFR calc non Af Amer: 64 mL/min/{1.73_m2} (ref >59)
GLOBULIN, TOTAL: 2 g/dL (ref 1.5–4.5)
Glucose: 88 mg/dL (ref 65–99)
Potassium: 4.7 mmol/L (ref 3.5–5.2)
SODIUM: 140 mmol/L (ref 134–144)
Total Protein: 6.1 g/dL (ref 6.0–8.5)

## 2018-12-25 LAB — LIPID PANEL
Chol/HDL Ratio: 2.6 ratio (ref 0.0–5.0)
Cholesterol, Total: 127 mg/dL (ref 100–199)
HDL: 48 mg/dL (ref >39)
LDL Calculated: 66 mg/dL (ref 0–99)
Triglycerides: 64 mg/dL (ref 0–149)
VLDL Cholesterol Cal: 13 mg/dL (ref 5–40)

## 2018-12-25 LAB — CBC
Hematocrit: 47.5 % (ref 37.5–51.0)
Hemoglobin: 16.2 g/dL (ref 13.0–17.7)
MCH: 32.6 pg (ref 26.6–33.0)
MCHC: 34.1 g/dL (ref 31.5–35.7)
MCV: 96 fL (ref 79–97)
Platelets: 197 10*3/uL (ref 150–450)
RBC: 4.97 x10E6/uL (ref 4.14–5.80)
RDW: 12 % (ref 11.6–15.4)
WBC: 6.9 10*3/uL (ref 3.4–10.8)

## 2018-12-25 LAB — TSH: TSH: 2.18 u[IU]/mL (ref 0.450–4.500)

## 2019-05-26 ENCOUNTER — Ambulatory Visit (INDEPENDENT_AMBULATORY_CARE_PROVIDER_SITE_OTHER): Payer: Medicare Other | Admitting: *Deleted

## 2019-05-26 VITALS — BP 119/72 | HR 70 | Ht 67.0 in | Wt 160.0 lb

## 2019-05-26 DIAGNOSIS — Z0000 Encounter for general adult medical examination without abnormal findings: Secondary | ICD-10-CM | POA: Diagnosis not present

## 2019-05-26 MED ORDER — ESCITALOPRAM OXALATE 5 MG PO TABS: 5.0000 mg | ORAL_TABLET | Freq: Every day | ORAL | 1 refills | Status: DC

## 2019-05-26 MED ORDER — PANTOPRAZOLE SODIUM 40 MG PO TBEC: 40.0000 mg | DELAYED_RELEASE_TABLET | Freq: Two times a day (BID) | ORAL | 1 refills | Status: AC

## 2019-05-26 MED ORDER — SPIRONOLACTONE 25 MG PO TABS: 25.0000 mg | ORAL_TABLET | Freq: Every day | ORAL | 1 refills | Status: DC

## 2019-05-26 MED ORDER — LOVASTATIN 40 MG PO TABS: 40.0000 mg | ORAL_TABLET | Freq: Every day | ORAL | 1 refills | Status: DC

## 2019-05-26 NOTE — Addendum Note (Signed)
Addended by: Zannie Cove on: 05/26/2019 04:09 PM   Modules accepted: Orders

## 2019-05-26 NOTE — Patient Instructions (Signed)
  Henry Ayala , Thank you for taking time to come for your Medicare Wellness Visit. I appreciate your ongoing commitment to your health goals. Please review the following plan we discussed and let me know if I can assist you in the future.   These are the goals we discussed: Goals    . Prevent falls     STAY ACTIVE        This is a list of the screening recommended for you and due dates:  Health Maintenance  Topic Date Due  . Tetanus Vaccine  05/28/1950  . Flu Shot  06/13/2019  . Pneumonia vaccines  Completed

## 2019-05-26 NOTE — Progress Notes (Signed)
MEDICARE ANNUAL WELLNESS VISIT  05/26/2019  Telephone Visit Disclaimer This Medicare AWV was conducted by telephone due to national recommendations for restrictions regarding the COVID-19 Pandemic (e.g. social distancing).  I verified, using two identifiers, that I am speaking with Henry Ayala or their authorized healthcare agent. I discussed the limitations, risks, security, and privacy concerns of performing an evaluation and management service by telephone and the potential availability of an in-person appointment in the future. The patient expressed understanding and agreed to proceed.   Subjective:  Henry Ayala is a 85 y.o. male patient of [name], DO who had a Medicare Annual Wellness Visit today via telephone. Henry Ayala is Retired and lives alone. he has 2 children. he reports that he is socially active and does interact with friends/family regularly. he is minimally physically active and enjoys golfing and seeing his friends.  Patient Care Team: [name], DO as PCP - General (Family Medicine) Satira Sark, MD as PCP - Cardiology (Cardiology) Wonda Horner, MD as Consulting Physician (Gastroenterology)  Advanced Directives 05/26/2019 11/20/2017 05/09/2016 02/12/2016 06/30/2014  Does Patient Have a Medical Advance Directive? No;Yes No Yes No No  Type of Advance Directive [place] will - -  Does patient want to make changes to medical advance directive? No - Patient declined - - - -  Would patient like information on creating a medical advance directive? No - Patient declined No - Patient declined - No - patient declined information No - patient declined information    Hospital Utilization Over the Past 12 Months: # of hospitalizations or ER visits: 0 # of surgeries: 0  Review of Systems    Patient reports that his overall health is unchanged compared to last year.  Patient Reported Readings (BP, Pulse, CBG, Weight,  etc) BP 119/72 Comment: last OV  Pulse 70   Ht 5\' 7"  (1.702 m)   Wt 160 lb (72.6 kg)   BMI 25.06 kg/m    Review of Systems: General ROS: negative  All other systems negative.  Pain Assessment       Current Medications & Allergies (verified) Allergies as of 05/26/2019      Reactions   Iodinated Diagnostic Agents    unknown   Lipitor [atorvastatin Calcium]    unknown   Sulfa Drugs Cross Reactors    unknown      Medication List       Accurate as of May 26, 2019  4:01 PM. If you have any questions, ask your nurse or doctor.        alum & mag hydroxide-simeth 200-200-20 MG/5ML suspension Commonly known as: MAALOX/MYLANTA Take 30 mLs by mouth every 6 (six) hours as needed for indigestion or heartburn.   apixaban 5 MG Tabs tablet Commonly known as: Eliquis Take 1 tablet (5 mg total) by mouth 2 (two) times daily.   bismuth subsalicylate 675 FF/63WG suspension Commonly known as: PEPTO BISMOL Take 30 mLs by mouth every 6 (six) hours as needed for diarrhea or loose stools.   doxazosin 4 MG tablet Commonly known as: CARDURA Take 1 tablet (4 mg total) by mouth daily.   escitalopram 5 MG tablet Commonly known as: LEXAPRO Take 1 tablet (5 mg total) by mouth daily.   lovastatin 40 MG tablet Commonly known as: MEVACOR Take 1 tablet (40 mg total) by mouth daily.   metoprolol succinate 25 MG 24 hr tablet Commonly known as: TOPROL-XL TAKE 1 TABLET BY  MOUTH ONCE DAILY   multivitamin with minerals tablet Take 1 tablet by mouth daily.   pantoprazole 40 MG tablet Commonly known as: PROTONIX Take 1 tablet (40 mg total) by mouth 2 (two) times daily.   spironolactone 25 MG tablet Commonly known as: Aldactone Take 1 tablet (25 mg total) by mouth daily.       History (reviewed): Past Medical History:  Diagnosis Date  . Anteroseptal myocardial infarction [facility])    PTCA to LAD 1992  . BPH (benign prostatic hyperplasia)   . CAD (coronary artery disease)    PTCA to  LAD 1992, BMS to RCA 2004  . Chronic atrial fibrillation   . Essential hypertension   . GERD (gastroesophageal reflux disease)   . GI bleed    March 2017 - esophagitis  . Hiatal hernia   . Hyperlipidemia   . Ischemic cardiomyopathy    [name] 48% 2015  . Myocardial infarction, inferior wall ([name])    BMS to proximal RCA [facility]  . Nephrolithiasis    stones   . Sleep apnea    On CPAP   Past Surgical History:  Procedure Laterality Date  . CARDIOVERSION N/A 06/30/2014   Procedure: CARDIOVERSION;  Surgeon: Sanda Klein, MD;  Location: [facility] ENDOSCOPY;  Service: Cardiovascular;  Laterality: N/A;  . ESOPHAGOGASTRODUODENOSCOPY Left 02/14/2016   Procedure: ESOPHAGOGASTRODUODENOSCOPY (EGD);  Surgeon: Wonda Horner, MD;  Location: [facility];  Service: Endoscopy;  Laterality: Left;  . EYE SURGERY Bilateral   . TONSILLECTOMY    . TOTAL KNEE ARTHROPLASTY     right   Family History  Problem Relation Age of Onset  . Heart attack Father   . Stroke Mother   . Cancer Mother        colon  . Heart attack Brother 68  . Cancer Brother        prostate  . Cancer Sister        colon  . Parkinson's disease Sister   . Stroke Maternal Grandmother   . Cancer Daughter        unknown origin   . GI problems Son    Social History   Socioeconomic History  . Marital status: Widowed    Spouse name: Not on file  . Number of children: 2  . Years of education: Not on file  . Highest education level: Not on file  Occupational History  . Occupation: retired    Comment: 1999- [employer]  Social Needs  . Financial resource strain: Not on file  . Food insecurity    Worry: Not on file    Inability: Not on file  . Transportation needs    Medical: Not on file    Non-medical: Not on file  Tobacco Use  . Smoking status: Never Smoker  . Smokeless tobacco: Never Used  Substance and Sexual Activity  . Alcohol use: Yes    Alcohol/week: 0.0 standard drinks    Comment: occasionally drink  . Drug use:  No  . Sexual activity: Not on file  Lifestyle  . Physical activity    Days per week: Not on file    Minutes per session: Not on file  . Stress: Not on file  Relationships  . Social Herbalist on phone: Not on file    Gets together: Not on file    Attends religious service: Not on file    Active member of club or organization: Not on file    Attends meetings of clubs  or organizations: Not on file    Relationship status: Not on file  Other Topics Concern  . Not on file  Social History Narrative   Lives alone ( 2 children - not local)     Activities of Daily Living In your present state of health, do you have any difficulty performing the following activities: 05/26/2019  Hearing? N  Vision? Y  Comment wears reading glasses  Difficulty concentrating or making decisions? N  Walking or climbing stairs? N  Dressing or bathing? N  Doing errands, shopping? N  Preparing Food and eating ? N  Using the Toilet? N  In the past six months, have you accidently leaked urine? N  Do you have problems with loss of bowel control? N  Managing your Medications? Y  Managing your Finances? N  Housekeeping or managing your Housekeeping? N  Some recent data might be hidden    Patient Literacy    Exercise Current Exercise Habits: The patient does not participate in regular exercise at present, Exercise limited by: None identified  Diet Patient reports consuming 2 meals a day and 2 snack(s) a day Patient reports that his primary diet is: Regular Patient reports that he does have regular access to food.   Depression Screen PHQ 2/9 Scores 12/24/2018 10/03/2018 06/23/2018 02/20/2018 11/20/2017 10/28/2017 09/05/2017  PHQ - 2 Score 0 0 0 0 0 0 0  PHQ- 9 Score 0 0 - - - - -     Fall Risk Fall Risk  05/26/2019 12/24/2018 10/03/2018 06/23/2018 02/20/2018  Falls in the past year? 0 1 1 No No  Number falls in past yr: - 0 0 - -  Injury with Fall? - 0 1 - -     Objective:  Henry Ayala  seemed alert and oriented and he participated appropriately during our telephone visit.  Blood Pressure Weight BMI  BP Readings from Last 3 Encounters:  05/26/19 119/72  12/24/18 119/72  10/07/18 115/76   Wt Readings from Last 3 Encounters:  05/26/19 160 lb (72.6 kg)  12/24/18 160 lb (72.6 kg)  10/07/18 167 lb (75.8 kg)   BMI Readings from Last 1 Encounters:  05/26/19 25.06 kg/m    *Unable to obtain current vital signs, weight, and BMI due to telephone visit type  Hearing/Vision  . Axil did not seem to have difficulty with hearing/understanding during the telephone conversation . Reports that he has not had a formal eye exam by an eye care professional within the past year . Reports that he has not had a formal hearing evaluation within the past year *Unable to fully assess hearing and vision during telephone visit type  Cognitive Function: 6CIT Screen 05/26/2019  What Year? 0 points  What month? 0 points  What time? 0 points  Count back from 20 0 points  Months in reverse 0 points  Repeat phrase 2 points  Total Score 2   (Normal:0-7, Significant for Dysfunction: >8)  Normal Cognitive Function Screening: Yes   Immunization & Health Maintenance Record Immunization History  Administered Date(s) Administered  . Influenza Whole 10/13/2011  . Influenza, High Dose Seasonal PF 08/28/2016, 09/05/2017, 09/15/2018  . Influenza-Unspecified 09/12/2013  . Pneumococcal Conjugate-13 03/29/2015, 11/20/2017  . Pneumococcal Polysaccharide-23 02/14/2016    Health Maintenance  Topic Date Due  . TETANUS/TDAP  05/28/1950  . INFLUENZA VACCINE  06/13/2019  . PNA vac Low Risk Adult  Completed       Assessment  This is a routine wellness examination for Henry Plog  Ayala.  Health Maintenance: Due or Overdue Health Maintenance Due  Topic Date Due  . Samul Dada  05/28/1950    Henry Ayala does not need a referral for Community Assistance: Care Management:   no Social  Work:    no Prescription Assistance:  no Nutrition/Diabetes Education:  no   Plan:  Personalized Goals Goals Addressed            This Visit's Progress   . Prevent falls   On track    [place] Maintenance & Screening Recommendations  Td vaccine  Lung Cancer Screening Recommended: no (Low Dose CT Chest recommended if Age 29-80 years, 30 pack-year currently smoking OR have quit w/in past 15 years) Hepatitis C Screening recommended: no HIV Screening recommended: no  Advanced Directives: Written information was not prepared per patient's request.  Referrals & Orders No orders of the defined types were placed in this encounter.   Follow-up Plan . Follow-up with [name], DO as planned    I have personally reviewed and noted the following in the patient's chart:   . Medical and social history . Use of alcohol, tobacco or illicit drugs  . Current medications and supplements . Functional ability and status . Nutritional status . Physical activity . Advanced directives . List of other physicians . Hospitalizations, surgeries, and ER visits in previous 12 months . Vitals . Screenings to include cognitive, depression, and falls . Referrals and appointments  In addition, I have reviewed and discussed with Henry Ayala certain preventive protocols, quality metrics, and best practice recommendations. A written personalized care plan for preventive services as well as general preventive health recommendations is available and can be mailed to the patient at his request.      [place] Dec  05/26/2019

## 2019-06-23 ENCOUNTER — Other Ambulatory Visit: Payer: Self-pay

## 2019-06-24 ENCOUNTER — Ambulatory Visit: Payer: Medicare Other | Admitting: Family Medicine

## 2019-06-24 ENCOUNTER — Encounter: Payer: Medicare Other | Admitting: *Deleted

## 2019-06-26 IMAGING — DX DG KNEE 1-2V*R*
2 series · 2 of 2 positions shown · non-contrast
Comparison: None.

CLINICAL DATA: Right knee pain after fall.

EXAM:
RIGHT KNEE - 1-2 VIEW

[knee ap]
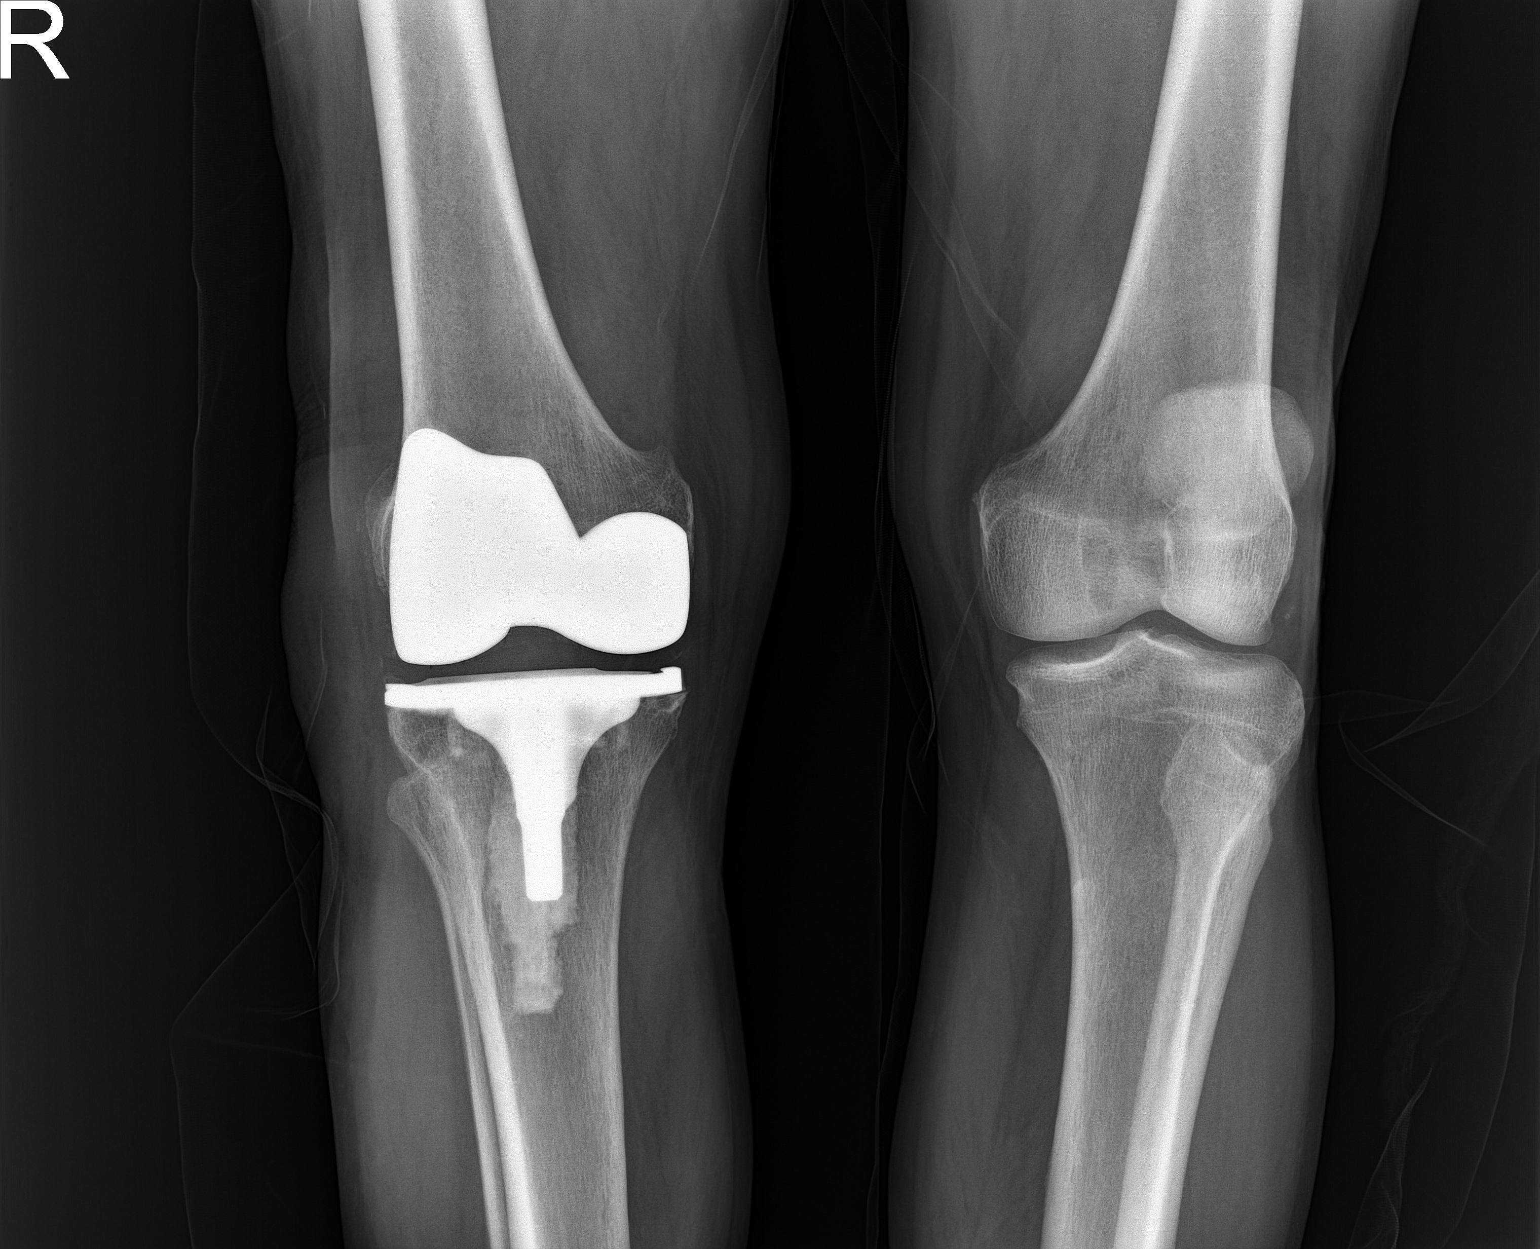

[knee lat]
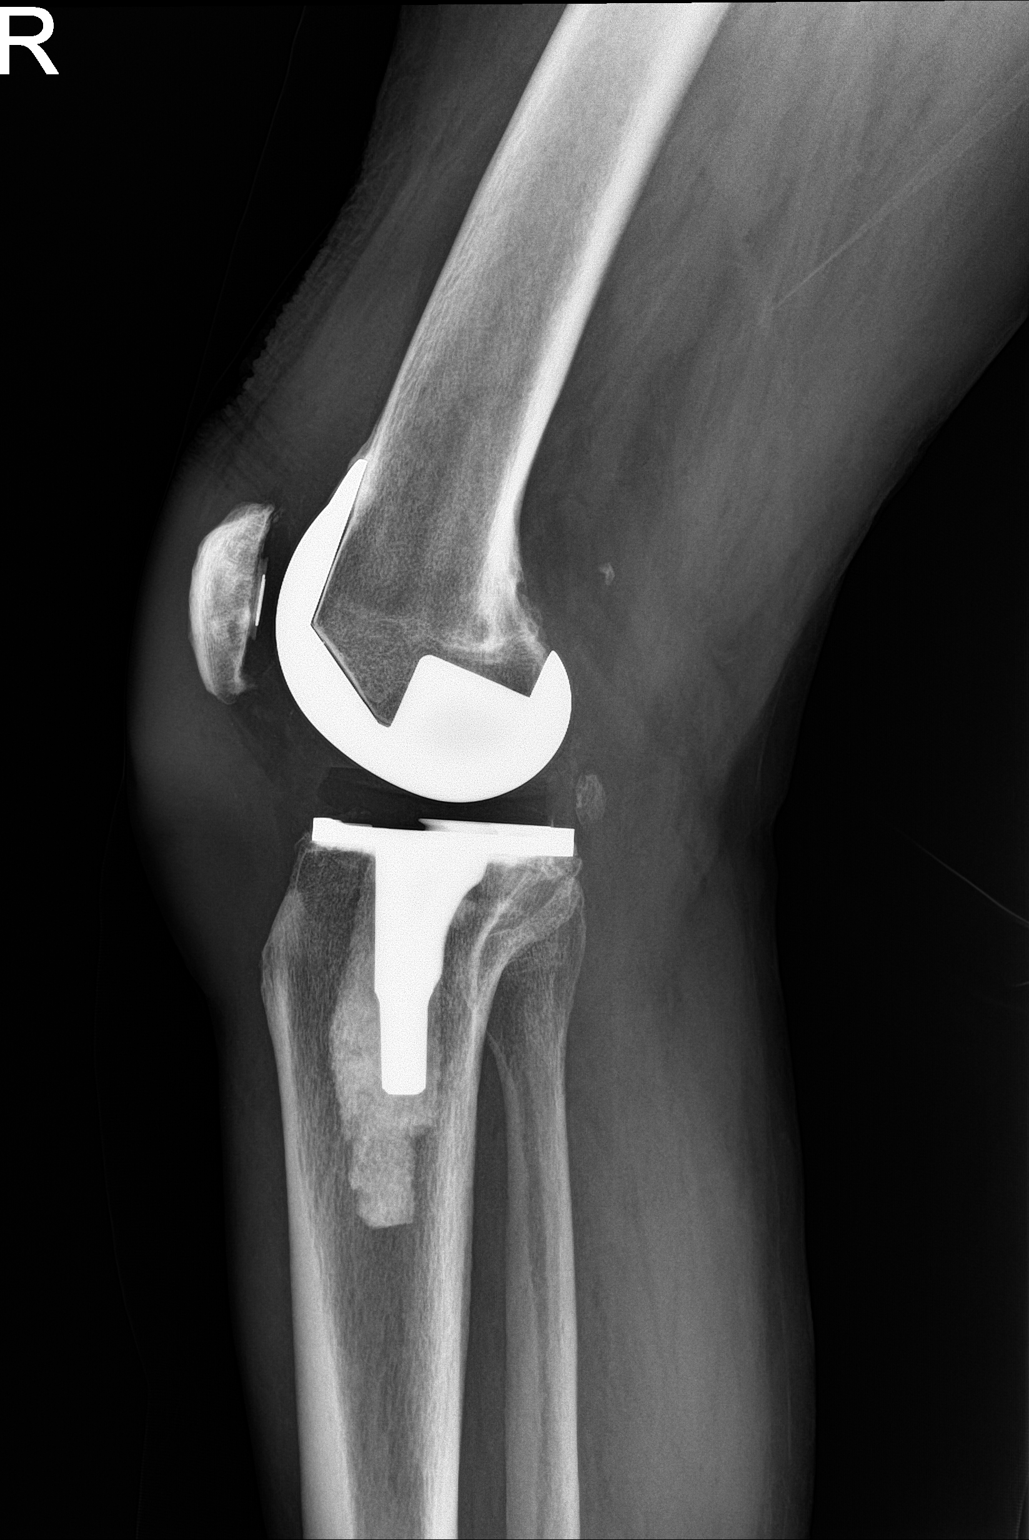

[2 of 2 positions shown; findings below may reference images not displayed]

FINDINGS: Status post right knee arthroplasty. The tibial component appears to
be well situated. Lucency is seen under the anterior aspect of the
femoral component and loosening cannot be excluded. No fracture,
dislocation or effusion is noted. Soft tissue swelling is noted
anteriorly suggesting hematoma.
IMPRESSION: Status post right total knee arthroplasty. No fracture or
dislocation is noted. Probable anterior soft tissue hematoma is
noted. Lucency is seen under anterior portion of femoral component
and possible prosthetic loosening cannot be excluded. Clinical
correlation is recommended.

## 2019-07-23 ENCOUNTER — Other Ambulatory Visit: Payer: Self-pay

## 2019-07-24 ENCOUNTER — Ambulatory Visit: Payer: Medicare Other | Admitting: Family Medicine

## 2019-07-24 ENCOUNTER — Encounter: Payer: Self-pay | Admitting: Family Medicine

## 2019-07-27 ENCOUNTER — Encounter: Payer: Self-pay | Admitting: Family Medicine

## 2019-08-13 DIAGNOSIS — Z23 Encounter for immunization: Secondary | ICD-10-CM | POA: Diagnosis not present

## 2019-08-19 ENCOUNTER — Other Ambulatory Visit: Payer: Self-pay

## 2019-08-20 ENCOUNTER — Ambulatory Visit: Payer: Medicare Other

## 2019-09-14 ENCOUNTER — Other Ambulatory Visit: Payer: Self-pay

## 2019-09-15 ENCOUNTER — Ambulatory Visit (INDEPENDENT_AMBULATORY_CARE_PROVIDER_SITE_OTHER): Payer: Medicare Other | Admitting: Family Medicine

## 2019-09-15 ENCOUNTER — Encounter: Payer: Self-pay | Admitting: Family Medicine

## 2019-09-15 VITALS — BP 88/56 | HR 80 | Temp 97.3°F | Ht 67.0 in | Wt 157.0 lb

## 2019-09-15 DIAGNOSIS — F341 Dysthymic disorder: Secondary | ICD-10-CM

## 2019-09-15 DIAGNOSIS — D649 Anemia, unspecified: Secondary | ICD-10-CM | POA: Diagnosis not present

## 2019-09-15 DIAGNOSIS — I9589 Other hypotension: Secondary | ICD-10-CM | POA: Diagnosis not present

## 2019-09-15 DIAGNOSIS — I10 Essential (primary) hypertension: Secondary | ICD-10-CM | POA: Diagnosis not present

## 2019-09-15 DIAGNOSIS — Z7901 Long term (current) use of anticoagulants: Secondary | ICD-10-CM | POA: Diagnosis not present

## 2019-09-15 DIAGNOSIS — I482 Chronic atrial fibrillation, unspecified: Secondary | ICD-10-CM

## 2019-09-15 MED ORDER — DOXAZOSIN MESYLATE 2 MG PO TABS: 2.0000 mg | ORAL_TABLET | Freq: Every day | ORAL | 1 refills | Status: AC

## 2019-09-15 MED ORDER — LOVASTATIN 40 MG PO TABS: 40.0000 mg | ORAL_TABLET | Freq: Every day | ORAL | 1 refills | Status: AC

## 2019-09-15 MED ORDER — APIXABAN 5 MG PO TABS: 5.0000 mg | ORAL_TABLET | Freq: Two times a day (BID) | ORAL | 1 refills | Status: AC

## 2019-09-15 MED ORDER — SPIRONOLACTONE 25 MG PO TABS: 25.0000 mg | ORAL_TABLET | Freq: Every day | ORAL | 1 refills | Status: AC

## 2019-09-15 MED ORDER — ESCITALOPRAM OXALATE 5 MG PO TABS: 5.0000 mg | ORAL_TABLET | Freq: Every day | ORAL | 1 refills | Status: DC

## 2019-09-15 NOTE — Progress Notes (Signed)
Subjective: CC: Follow up Hypertension/hyperlipidemia/atrial fibrillation; Depressive disorder PCP: [name], DO [id number] P Henry Ayala is a 85 y.o. male presenting to clinic today for:  1.  Hypertension/hyperlipidemia//atrial fibrillation History: sees Dr Domenic Polite, cardiology. Has permanent atrial fibrillation. Has h/o GI bleed and treated with PPI for prevention.  Patient compliant w/ Aldactone, Toprol, Lovastatin and anticoagulated with Eliquis.  He does not monitor blood pressures at home.  He reports that he is feeling well.  No chest pain, shortness of breath, heart palpitations, lower extremity edema.  Denies any melena, hematochezia, hematuria.  He admits not to drinking much water today.  2.  Mood History of depressive disorder.  Compliant w/ Lexapro 5 mg daily.  Mood is well controlled.  Denies any depressive symptoms.   ROS: Per HPI  Allergies  Allergen Reactions  . Iodinated Diagnostic Agents     unknown  . Lipitor [Atorvastatin Calcium]     unknown  . Sulfa Drugs Cross Reactors     unknown   Past Medical History:  Diagnosis Date  . Anteroseptal myocardial infarction [facility])    PTCA to LAD 1992  . BPH (benign prostatic hyperplasia)   . CAD (coronary artery disease)    PTCA to LAD 1992, BMS to RCA 2004  . Chronic atrial fibrillation ([place])   . Essential hypertension   . GERD (gastroesophageal reflux disease)   . GI bleed    March 2017 - esophagitis  . Hiatal hernia   . Hyperlipidemia   . Hypokalemia   . Hypomagnesemia   . Ischemic cardiomyopathy    [name] 48% 2015  . Leukocytosis 05/09/2016  . Myocardial infarction, inferior wall ([name])    BMS to proximal RCA 2004 at [facility]  . Nephrolithiasis    stones   . Sleep apnea    On CPAP    Current Outpatient Medications:  .  alum & mag hydroxide-simeth (MAALOX/MYLANTA) 200-200-20 MG/5ML suspension, Take 30 mLs by mouth every 6 (six) hours as needed for indigestion or heartburn., Disp: 355 mL, Rfl: 2 .   apixaban (ELIQUIS) 5 MG TABS tablet, Take 1 tablet (5 mg total) by mouth 2 (two) times daily., Disp: 180 tablet, Rfl: 1 .  bismuth subsalicylate (PEPTO BISMOL) 262 MG/15ML suspension, Take 30 mLs by mouth every 6 (six) hours as needed for diarrhea or loose stools., Disp: , Rfl:  .  doxazosin (CARDURA) 4 MG tablet, Take 1 tablet (4 mg total) by mouth daily., Disp: 90 tablet, Rfl: 1 .  escitalopram (LEXAPRO) 5 MG tablet, Take 1 tablet (5 mg total) by mouth daily., Disp: 90 tablet, Rfl: 1 .  lovastatin (MEVACOR) 40 MG tablet, Take 1 tablet (40 mg total) by mouth daily., Disp: 90 tablet, Rfl: 1 .  metoprolol succinate (TOPROL-XL) 25 MG 24 hr tablet, TAKE 1 TABLET BY MOUTH ONCE DAILY, Disp: 90 tablet, Rfl: 3 .  Multiple Vitamins-Minerals (MULTIVITAMIN WITH MINERALS) tablet, Take 1 tablet by mouth daily., Disp: , Rfl:  .  pantoprazole (PROTONIX) 40 MG tablet, Take 1 tablet (40 mg total) by mouth 2 (two) times daily., Disp: 180 tablet, Rfl: 1 .  spironolactone (ALDACTONE) 25 MG tablet, Take 1 tablet (25 mg total) by mouth daily., Disp: 90 tablet, Rfl: 1 Social History   Socioeconomic History  . Marital status: Widowed    Spouse name: Not on file  . Number of children: 2  . Years of education: Not on file  . Highest education level: Not on file  Occupational History  . Occupation: retired  Comment: [employer]  . Financial resource strain: Not on file  . Food insecurity    Worry: Not on file    Inability: Not on file  . Transportation needs    Medical: Not on file    Non-medical: Not on file  Tobacco Use  . Smoking status: Never Smoker  . Smokeless tobacco: Never Used  Substance and Sexual Activity  . Alcohol use: Yes    Alcohol/week: 0.0 standard drinks    Comment: occasionally drink  . Drug use: No  . Sexual activity: Not on file  Lifestyle  . Physical activity    Days per week: Not on file    Minutes per session: Not on file  . Stress: Not on file  Relationships   . Social Herbalist on phone: Not on file    Gets together: Not on file    Attends religious service: Not on file    Active member of club or organization: Not on file    Attends meetings of clubs or organizations: Not on file    Relationship status: Not on file  . Intimate partner violence    Fear of current or ex partner: Not on file    Emotionally abused: Not on file    Physically abused: Not on file    Forced sexual activity: Not on file  Other Topics Concern  . Not on file  Social History Narrative   Lives alone ( 2 children - not local)    Family History  Problem Relation Age of Onset  . Heart attack Father   . Stroke Mother   . Cancer Mother        colon  . Heart attack Brother 69  . Cancer Brother        prostate  . Cancer Sister        colon  . Parkinson's disease Sister   . Stroke Maternal Grandmother   . Cancer Daughter        unknown origin   . GI problems Son     Objective: Office vital signs reviewed. BP (!) 88/56   Pulse 80   Temp (!) 97.3 F (36.3 C) (Temporal)   Ht 5' 7"  (1.702 m)   Wt 157 lb (71.2 kg)   SpO2 97%   BMI 24.59 kg/m   Physical Examination:  General: Awake, alert, well nourished, well appearing elderly male. No acute distress HEENT: Normal. MMM Cardio: irregularly irregular w/ rate control. S1S2 heard, no murmurs appreciated Pulm: clear to auscultation bilaterally, no wheezes, rhonchi or rales; normal work of breathing on room air Extremities: warm, well perfused, No edema, cyanosis or clubbing; +2 pulses bilaterally MSK: slow gait and hunched station Neuro: hard of hearing.   Psych: Mood stable, pleasant and interactive Depression screen [facility] 2/9 09/15/2019 12/24/2018 10/03/2018  Decreased Interest 0 0 0  Down, Depressed, Hopeless 0 0 0  PHQ - 2 Score 0 0 0  Altered sleeping 0 0 0  Tired, decreased energy 0 0 0  Change in appetite 0 0 0  Feeling bad or failure about yourself  0 0 0  Trouble concentrating 0 0 0   Moving slowly or fidgety/restless 0 0 0  Suicidal thoughts - 0 0  PHQ-9 Score 0 0 0  Difficult doing work/chores - - Not difficult at all   Assessment/ Plan: 85 y.o. male   1. Other specified hypotension Likely secondary to medications and lack of hydration.  I  encouraged him to hydrate orally since he is asymptomatic.  I have asked him to skip his doxazosin dose and have also reduced his dose of doxazosin to 2 mg.  He will return on Friday to have his blood pressure rechecked.  At that time if blood pressures within normal range, we can proceed with laboratory draw.  If not, may need to discontinue doxazosin totally and have him follow-up again for blood pressure recheck.  Appt scheduled with RN.  He was given written date and time and also verbally told when to follow up.  2. Benign essential HTN As above - CMP14+EGFR; Future  3. Chronic atrial fibrillation ([name]) Rate controlled. - CBC; Future - TSH; Future  4. Anticoagulant long-term use - CBC; Future  5. Normocytic anemia - CBC; Future  6. Dysthymia Controlled - TSH; Future   Orders Placed This Encounter  Procedures  . CBC    Standing Status:   Future    Standing Expiration Date:   09/14/2020  . CMP14+EGFR    Standing Status:   Future    Standing Expiration Date:   09/14/2020  . TSH    Standing Status:   Future    Standing Expiration Date:   09/14/2020   Meds ordered this encounter  Medications  . apixaban (ELIQUIS) 5 MG TABS tablet    Sig: Take 1 tablet (5 mg total) by mouth 2 (two) times daily.    Dispense:  180 tablet    Refill:  1  . doxazosin (CARDURA) 2 MG tablet    Sig: Take 1 tablet (2 mg total) by mouth daily.    Dispense:  90 tablet    Refill:  1  . escitalopram (LEXAPRO) 5 MG tablet    Sig: Take 1 tablet (5 mg total) by mouth daily.    Dispense:  90 tablet    Refill:  1    Please consider 90 day supplies to promote better adherence  . lovastatin (MEVACOR) 40 MG tablet    Sig: Take 1 tablet (40  mg total) by mouth daily.    Dispense:  90 tablet    Refill:  1  . spironolactone (ALDACTONE) 25 MG tablet    Sig: Take 1 tablet (25 mg total) by mouth daily.    Dispense:  90 tablet    Refill:  [place], [place] [phone]

## 2019-09-15 NOTE — Patient Instructions (Addendum)
SKIP the DOXAZOSIN today.  Get plenty of fluids in.  Come in Friday for Blood pressure recheck.  I have REDUCED your doxazosin pill to 2mg  because your blood pressure is too low.  We may need to discontinue it totally if it does not improve with fluids.  We will do labs on Friday as well since your blood pressure is LOW.   Hypotension As your heart beats, it forces blood through your body. This force is called blood pressure. If you have hypotension, you have low blood pressure. When your blood pressure is too low, you may not get enough blood to your brain or other parts of your body. This may cause you to feel weak, light-headed, have a fast heartbeat, or even pass out (faint). Low blood pressure may be harmless, or it may cause serious problems. What are the causes?  Blood loss.  Not enough water in the body (dehydration).  Heart problems.  Hormone problems.  Pregnancy.  A very bad infection.  Not having enough of certain nutrients.  Very bad allergic reactions.  Certain medicines. What increases the risk?  Age. The risk increases as you get older.  Conditions that affect the heart or the brain and spinal cord (central nervous system).  Taking certain medicines.  Being pregnant. What are the signs or symptoms?  Feeling: ? Weak. ? Light-headed. ? Dizzy. ? Tired (fatigued).  Blurred vision.  Fast heartbeat.  Passing out, in very bad cases. How is this treated?  Changing your diet. This may involve eating more salt (sodium) or drinking more water.  Taking medicines to raise your blood pressure.  Changing how much you take (the dosage) of some of your medicines.  Wearing compression stockings. These stockings help to prevent blood clots and reduce swelling in your legs. In some cases, you may need to go to the hospital for:  Fluid replacement. This means you will receive fluids through an IV tube.  Blood replacement. This means you will receive donated  blood through an IV tube (transfusion).  Treating an infection or heart problems, if this applies.  Monitoring. You may need to be monitored while medicines that you are taking wear off. Follow these instructions at home: Eating and drinking   Drink enough fluids to keep your pee (urine) pale yellow.  Eat a healthy diet. Follow instructions from your doctor about what you can eat or drink. A healthy diet includes: ? Fresh fruits and vegetables. ? Whole grains. ? Low-fat (lean) meats. ? Low-fat dairy products.  Eat extra salt only as told. Do not add extra salt to your diet unless your doctor tells you to.  Eat small meals often.  Avoid standing up quickly after you eat. Medicines  Take over-the-counter and prescription medicines only as told by your doctor. ? Follow instructions from your doctor about changing how much you take of your medicines, if this applies. ? Do not stop or change any of your medicines on your own. General instructions   Wear compression stockings as told by your doctor.  Get up slowly from lying down or sitting.  Avoid hot showers and a lot of heat as told by your doctor.  Return to your normal activities as told by your doctor. Ask what activities are safe for you.  Do not use any products that contain nicotine or tobacco, such as cigarettes, e-cigarettes, and chewing tobacco. If you need help quitting, ask your doctor.  Keep all follow-up visits as told by your doctor. This  is important. Contact a doctor if:  You throw up (vomit).  You have watery poop (diarrhea).  You have a fever for more than 2-3 days.  You feel more thirsty than normal.  You feel weak and tired. Get help right away if:  You have chest pain.  You have a fast or uneven heartbeat.  You lose feeling (have numbness) in any part of your body.  You cannot move your arms or your legs.  You have trouble talking.  You get sweaty or feel light-headed.  You pass out.   You have trouble breathing.  You have trouble staying awake.  You feel mixed up (confused). Summary  Hypotension is also called low blood pressure. It is when the force of blood pumping through your arteries is too weak.  Hypotension may be harmless, or it may cause serious problems.  Treatment may include changing your diet and medicines, and wearing compression stockings.  In very bad cases, you may need to go to the hospital. This information is not intended to replace advice given to you by your health care provider. Make sure you discuss any questions you have with your health care provider. Document Released: 01/23/2010 Document Revised: 04/24/2018 Document Reviewed: 04/24/2018 Elsevier Patient Education  2020 [employer].

## 2019-09-16 ENCOUNTER — Telehealth: Payer: Self-pay | Admitting: Family Medicine

## 2019-09-16 NOTE — Telephone Encounter (Signed)
Spoke to daughter that I agree that patient would benefit from additional supervision.  Her brother is considering moving in with the patient and I think this would be an excellent idea, particularly since the CNA that he they used to have coming in to check on him is no longer available.  Given patient's recent hypotensive episode and concomitant use of blood thinners I do feel that he is at high risk should he sustain a fall.

## 2019-09-16 NOTE — Telephone Encounter (Signed)
Spoke with daughter and she wants to know if you think patient is compliant and able to care for himself. She states that her and her brother live far away and they were considering having the brother move in with their dad if he needs it but they are unsure. She states that he seems to be forgetful and they are concerned. Please advise

## 2019-09-18 ENCOUNTER — Ambulatory Visit: Payer: Medicare Other | Admitting: *Deleted

## 2019-09-18 ENCOUNTER — Other Ambulatory Visit: Payer: Self-pay

## 2019-09-18 VITALS — BP 115/62 | HR 84

## 2019-09-18 DIAGNOSIS — I10 Essential (primary) hypertension: Secondary | ICD-10-CM

## 2019-09-18 NOTE — Progress Notes (Signed)
Much better reading.  Ok to continue Doxazosin at 1/2 dose as we discussed. Keep hydrated.

## 2019-09-18 NOTE — Progress Notes (Signed)
Pt here for BP check. BP 115/62   Pulse 84

## 2019-10-16 ENCOUNTER — Telehealth: Payer: Self-pay | Admitting: Family Medicine

## 2019-10-16 NOTE — Telephone Encounter (Signed)
Spoke with pt's son and advised of Dr Darnell Level conversation with his sister about a month ago in regards to him possibly moving back to live with pt.  Spoke to daughter that I agree that patient would benefit from additional supervision.  Her brother is considering moving in with the patient and I think this would be an excellent idea, particularly since the CNA that he they used to have coming in to check on him is no longer available.  Given patient's recent hypotensive episode and concomitant use of blood thinners I do feel that he is at high risk should he sustain a fall.  Son agrees that pt will benefit and he will probably move back to live with the pt to help care for him.

## 2020-01-11 ENCOUNTER — Other Ambulatory Visit: Payer: Self-pay | Admitting: *Deleted

## 2020-01-11 MED ORDER — METOPROLOL SUCCINATE ER 25 MG PO TB24: 25.0000 mg | ORAL_TABLET | Freq: Every day | ORAL | 3 refills | Status: AC

## 2020-01-19 ENCOUNTER — Ambulatory Visit (INDEPENDENT_AMBULATORY_CARE_PROVIDER_SITE_OTHER): Payer: Medicare Other | Admitting: Family Medicine

## 2020-01-19 DIAGNOSIS — G479 Sleep disorder, unspecified: Secondary | ICD-10-CM | POA: Diagnosis not present

## 2020-01-19 DIAGNOSIS — F341 Dysthymic disorder: Secondary | ICD-10-CM

## 2020-01-19 DIAGNOSIS — L989 Disorder of the skin and subcutaneous tissue, unspecified: Secondary | ICD-10-CM | POA: Diagnosis not present

## 2020-01-19 DIAGNOSIS — Z85820 Personal history of malignant melanoma of skin: Secondary | ICD-10-CM | POA: Diagnosis not present

## 2020-01-19 MED ORDER — MIRTAZAPINE 7.5 MG PO TABS: 7.5000 mg | ORAL_TABLET | Freq: Every day | ORAL | 1 refills | Status: DC

## 2020-01-19 NOTE — Progress Notes (Signed)
Telephone visit  Subjective: CC: sleep difficulties PCP: [name], DO [id number] P Cercone is a 86 y.o. male calls for telephone consult today. Patient provides verbal consent for consult held via phone.  Due to COVID-19 pandemic this visit was conducted virtually. This visit type was conducted due to national recommendations for restrictions regarding the COVID-19 Pandemic (e.g. social distancing, sheltering in place) in an effort to limit this patient's exposure and mitigate transmission in our community. All issues noted in this document were discussed and addressed.  A physical exam was not performed with this format.   Location of patient: home Location of provider: Working remotely from home Others present for call: son, Jacqulynn Cadet  1. Sleep disturbance History is provided by his son today.  He notes that the patient drinks a cup of coffee each morning and a Dr Malachi Bonds x2 during the day, some napping particularly if he watching tv.  He wakes up in the middle of the night often and this is been new onset occurring only over the last week or so.  Denies nocturia.  He exhibited some paranoid thought about a home health aide that comes in during the week, regarding stealing, etc.  His son doesn't think this is true.  He notes his father has difficulty remembering dates (when his son is coming into town but this has been ongoing prior to sleep issues and is stable).  His health aide [name]) has not reported any worrisome changes in personality recently.  No incontinence.  He had his second COVID vaccine recently and was a little more tired and has a sore arm but he has not complained of feeling poorly.  2.  Skin lesion His son also reports a skin lesion on the anterior chest that he is worried about.  He would like [place] to have a look at this when he comes in for follow-up.  Not report any spontaneous bleeding but does note the patient has a history of melanoma.  He is no longer established  with a dermatologist.   [place] Exam 11/20/2017 10/18/2016 04/17/2016  Orientation to time 4 3 4   Orientation to Place 5 5 4   Registration 3 3 3   Attention/ Calculation 5 3 3   Recall 1 2 1   Language- name 2 objects 2 2 2   Language- repeat 1 1 1   Language- follow 3 step command 3 3 3   Language- read & follow direction 1 1 1   Write a sentence 1 1 1   Copy design 1 1 1   Total score 27 25 24     ROS: Per HPI  Allergies  Allergen Reactions  . Iodinated Diagnostic Agents     unknown  . Lipitor [Atorvastatin Calcium]     unknown  . Sulfa Drugs Cross Reactors     unknown   Past Medical History:  Diagnosis Date  . Anteroseptal myocardial infarction [facility])    PTCA to LAD 1992  . BPH (benign prostatic hyperplasia)   . CAD (coronary artery disease)    PTCA to LAD 1992, BMS to RCA 2004  . Chronic atrial fibrillation ([place])   . Essential hypertension   . GERD (gastroesophageal reflux disease)   . GI bleed    March 2017 - esophagitis  . Hiatal hernia   . Hyperlipidemia   . Hypokalemia   . Hypomagnesemia   . Ischemic cardiomyopathy    [name] 48% 2015  . Leukocytosis 05/09/2016  . Myocardial infarction, inferior wall ([name])    BMS  to proximal RCA 2004 at [facility]  . Nephrolithiasis    stones   . Sleep apnea    On CPAP    Current Outpatient Medications:  .  alum & mag hydroxide-simeth (MAALOX/MYLANTA) 200-200-20 MG/5ML suspension, Take 30 mLs by mouth every 6 (six) hours as needed for indigestion or heartburn., Disp: 355 mL, Rfl: 2 .  apixaban (ELIQUIS) 5 MG TABS tablet, Take 1 tablet (5 mg total) by mouth 2 (two) times daily., Disp: 180 tablet, Rfl: 1 .  bismuth subsalicylate (PEPTO BISMOL) 262 MG/15ML suspension, Take 30 mLs by mouth every 6 (six) hours as needed for diarrhea or loose stools., Disp: , Rfl:  .  doxazosin (CARDURA) 2 MG tablet, Take 1 tablet (2 mg total) by mouth daily., Disp: 90 tablet, Rfl: 1 .  escitalopram (LEXAPRO) 5 MG tablet, Take 1 tablet (5 mg  total) by mouth daily., Disp: 90 tablet, Rfl: 1 .  lovastatin (MEVACOR) 40 MG tablet, Take 1 tablet (40 mg total) by mouth daily., Disp: 90 tablet, Rfl: 1 .  metoprolol succinate (TOPROL-XL) 25 MG 24 hr tablet, Take 1 tablet (25 mg total) by mouth daily., Disp: 90 tablet, Rfl: 3 .  Multiple Vitamins-Minerals (MULTIVITAMIN WITH MINERALS) tablet, Take 1 tablet by mouth daily., Disp: , Rfl:  .  pantoprazole (PROTONIX) 40 MG tablet, Take 1 tablet (40 mg total) by mouth 2 (two) times daily., Disp: 180 tablet, Rfl: 1 .  spironolactone (ALDACTONE) 25 MG tablet, Take 1 tablet (25 mg total) by mouth daily., Disp: 90 tablet, Rfl: 1  Assessment/ Plan: 86 y.o. male   1. Sleep disturbance Uncertain etiology.  Possibly related to caffeine and naps during the day.  However, also may be related to an underlying mood disorder versus cognitive disorder.  I discussed the differentials with the son and we will transition him over from Celexa to mirtazapine.  He will stop the Celexa and start mirtazapine at 7.5 mg nightly.  We will reconvene in about 4 to 6 weeks for checkup.  We will increase the dose accordingly if needed.  We will also plan to conduct an MMSE on this patient since it has been greater than 2 years since last checkup. - mirtazapine (REMERON) 7.5 MG tablet; Take 1 tablet (7.5 mg total) by mouth at bedtime.  Dispense: 30 tablet; Refill: 1  2. Dysthymia - mirtazapine (REMERON) 7.5 MG tablet; Take 1 tablet (7.5 mg total) by mouth at bedtime.  Dispense: 30 tablet; Refill: 1  3. Skin lesion of chest wall Plan to evaluate skin lesion given history of melanoma.  We will likely need to consider biopsy  4. History of melanoma   Start time: 10:33am End time: 10:50am  Total time spent on patient care (including telephone call/ virtual visit): 25 minutes  [place], [place] [phone]

## 2020-01-19 NOTE — Patient Instructions (Signed)
Stop celexa.  START Mirtazapine.  See me in 4-6 weeks for recheck.

## 2020-01-26 ENCOUNTER — Telehealth: Payer: Self-pay | Admitting: *Deleted

## 2020-01-26 NOTE — Telephone Encounter (Signed)
No edema in the lower extremities. Do you want me to schedule him in office?

## 2020-01-26 NOTE — Telephone Encounter (Signed)
Spoke with pt's son today and he had some concerns as his father last night got out of the bed and came into the living room c/o some SOB while laying down. He sat on the couch for a few minutes taking some deep breaths and then was able to go back to bed but was restless throughout the night. Today pt is not having any SOB, denies heart racing, dizziness, lightheaded or general weakness. No other symptoms noted. Advised son to monitor for now and if it happens again or he starts c/o any of the above symptoms to call us back or make appt for evaluation.   Any other recommendations?

## 2020-01-26 NOTE — Telephone Encounter (Signed)
This sounds like orthopnea, which can indicate that he is having a CHF exacerbation.  Has he gained any weight or been having any lower extremity swelling?  May need to consider in office evaluation.

## 2020-01-26 NOTE — Telephone Encounter (Signed)
I think it's worth him getting checked out if he is having orthopnea.  Will need CXR to look for edema, BNP, BMP, CBC.  If I have any slots please put him on my schedule.  Otherwise, on same day

## 2020-01-26 NOTE — Telephone Encounter (Signed)
Spoke with son and scheduled pt with Dr Livia Snellen 01/28/20-Dr Dettinger is same day tomorrow but has no openings.

## 2020-01-28 ENCOUNTER — Ambulatory Visit (INDEPENDENT_AMBULATORY_CARE_PROVIDER_SITE_OTHER): Payer: Medicare Other | Admitting: Family Medicine

## 2020-01-28 ENCOUNTER — Ambulatory Visit (INDEPENDENT_AMBULATORY_CARE_PROVIDER_SITE_OTHER): Payer: Medicare Other

## 2020-01-28 ENCOUNTER — Other Ambulatory Visit: Payer: Self-pay

## 2020-01-28 ENCOUNTER — Encounter: Payer: Self-pay | Admitting: Family Medicine

## 2020-01-28 VITALS — BP 120/71 | HR 86 | Temp 97.5°F | Ht 67.0 in | Wt 156.4 lb

## 2020-01-28 DIAGNOSIS — F341 Dysthymic disorder: Secondary | ICD-10-CM

## 2020-01-28 DIAGNOSIS — R0600 Dyspnea, unspecified: Secondary | ICD-10-CM

## 2020-01-28 DIAGNOSIS — G479 Sleep disorder, unspecified: Secondary | ICD-10-CM | POA: Diagnosis not present

## 2020-01-28 DIAGNOSIS — R0602 Shortness of breath: Secondary | ICD-10-CM | POA: Diagnosis not present

## 2020-01-28 DIAGNOSIS — K449 Diaphragmatic hernia without obstruction or gangrene: Secondary | ICD-10-CM | POA: Diagnosis not present

## 2020-01-28 MED ORDER — MIRTAZAPINE 15 MG PO TABS: 15.0000 mg | ORAL_TABLET | Freq: Every day | ORAL | 1 refills | Status: DC

## 2020-01-28 NOTE — Progress Notes (Signed)
Subjective:  Patient ID: Henry Ayala, male    DOB: 11-Jul-1931  Age: 86 y.o. MRN: [id number]  CC: Insomnia, Shortness of Breath, Dizziness, and Altered Mental Status   HPI Henry Ayala presents for confusion and a single episode of dizziness. It lasted an hour or so. It occurred 2 nights ago. It happened once and hasn't recurred. The confusion that went with has been noted before by his son who accompanies him a supplements the history. He had just had his second CoVID shot.   He is having dyspnea and vague chest discomfort. He has increased pain with laying down.   Depression screen [facility] 2/9 01/28/2020 09/15/2019 12/24/2018  Decreased Interest 2 0 0  Down, Depressed, Hopeless 1 0 0  PHQ - 2 Score 3 0 0  Altered sleeping 3 0 0  Tired, decreased energy 3 0 0  Change in appetite 0 0 0  Feeling bad or failure about yourself  0 0 0  Trouble concentrating 0 0 0  Moving slowly or fidgety/restless 0 0 0  Suicidal thoughts 0 - 0  PHQ-9 Score 9 0 0  Difficult doing work/chores Somewhat difficult - -    History Tyga has a past medical history of Anteroseptal myocardial infarction ([place]), BPH (benign prostatic hyperplasia), CAD (coronary artery disease), Chronic atrial fibrillation ([place]), Essential hypertension, GERD (gastroesophageal reflux disease), GI bleed, Hiatal hernia, Hyperlipidemia, Hypokalemia, Hypomagnesemia, Ischemic cardiomyopathy, Leukocytosis (05/09/2016), Myocardial infarction, inferior wall ([place]), Nephrolithiasis, and Sleep apnea.   He has a past surgical history that includes Total knee arthroplasty; Cardioversion (N/A, 06/30/2014); Esophagogastroduodenoscopy (Left, 02/14/2016); Tonsillectomy; and Eye surgery (Bilateral).   His family history includes Cancer in his brother, daughter, mother, and sister; GI problems in his son; Heart attack in his father; Heart attack (age of onset: 35) in his brother; Parkinson's disease in his sister; Stroke in his maternal grandmother and  mother.He reports that he has never smoked. He has never used smokeless tobacco. He reports current alcohol use. He reports that he does not use drugs.    ROS Review of Systems  Constitutional: Negative for fever.  Respiratory: Negative for shortness of breath.   Cardiovascular: Negative for chest pain.  Musculoskeletal: Negative for arthralgias.  Skin: Negative for rash.    Objective:  BP 120/71   Pulse 86   Temp (!) 97.5 F (36.4 C)   Ht 5\' 7"  (1.702 m)   Wt 156 lb 6.4 oz (70.9 kg)   BMI 24.50 kg/m   BP Readings from Last 3 Encounters:  01/28/20 120/71  09/18/19 115/62  09/15/19 (!) 88/56    Wt Readings from Last 3 Encounters:  01/28/20 156 lb 6.4 oz (70.9 kg)  09/15/19 157 lb (71.2 kg)  05/26/19 160 lb (72.6 kg)     Physical Exam Constitutional:      General: He is not in acute distress.    Appearance: He is well-developed.  HENT:     Head: Normocephalic and atraumatic.     Right Ear: External ear normal.     Left Ear: External ear normal.     Nose: Nose normal.  Eyes:     Conjunctiva/sclera: Conjunctivae normal.     Pupils: Pupils are equal, round, and reactive to light.  Cardiovascular:     Rate and Rhythm: Normal rate and regular rhythm.     Heart sounds: Normal heart sounds. No murmur.  Pulmonary:     Effort: Pulmonary effort is normal. No respiratory distress.     Breath  sounds: Normal breath sounds. No wheezing or rales.  Abdominal:     Palpations: Abdomen is soft.     Tenderness: There is no abdominal tenderness.  Musculoskeletal:        General: Normal range of motion.     Cervical back: Normal range of motion and neck supple.  Skin:    General: Skin is warm and dry.  Neurological:     Mental Status: He is alert and oriented to person, place, and time.     Deep Tendon Reflexes: Reflexes are normal and symmetric.  Psychiatric:        Behavior: Behavior normal.        Thought Content: Thought content normal.        Judgment: Judgment  normal.       Assessment & Plan:   Henry Ayala was seen today for insomnia, shortness of breath, dizziness and altered mental status.  Diagnoses and all orders for this visit:  Dyspnea, unspecified type -     DG Chest 2 View; Future  Diaphragmatic hernia without obstruction and without gangrene  Sleep disturbance -     mirtazapine (REMERON) 15 MG tablet; Take 1 tablet (15 mg total) by mouth at bedtime.  Dysthymia -     mirtazapine (REMERON) 15 MG tablet; Take 1 tablet (15 mg total) by mouth at bedtime.   Discussed with him that the dyspnea is permanent. It is not feasible to have a surgery that is so big at an advanced age. The condition is not likely to be terminal, but will cause discomfort. He can use a wedge to help with the nighttime dyspnea. Remeron dose increased to help with sleep disruptions.    I have changed Henry Challenger "Bob"'s mirtazapine. I am also having him maintain his bismuth subsalicylate, alum & mag hydroxide-simeth, multivitamin with minerals, pantoprazole, apixaban, doxazosin, lovastatin, spironolactone, and metoprolol succinate.  Allergies as of 01/28/2020      Reactions   Iodinated Diagnostic Agents    unknown   Lipitor [atorvastatin Calcium]    unknown   Sulfa Drugs Cross Reactors    unknown      Medication List       Accurate as of January 28, 2020 11:59 PM. If you have any questions, ask your nurse or doctor.        alum & mag hydroxide-simeth 200-200-20 MG/5ML suspension Commonly known as: MAALOX/MYLANTA Take 30 mLs by mouth every 6 (six) hours as needed for indigestion or heartburn.   apixaban 5 MG Tabs tablet Commonly known as: Eliquis Take 1 tablet (5 mg total) by mouth 2 (two) times daily.   bismuth subsalicylate [id number] suspension Commonly known as: PEPTO BISMOL Take 30 mLs by mouth every 6 (six) hours as needed for diarrhea or loose stools.   doxazosin 2 MG tablet Commonly known as: CARDURA Take 1 tablet (2 mg total) by  mouth daily.   lovastatin 40 MG tablet Commonly known as: MEVACOR Take 1 tablet (40 mg total) by mouth daily.   metoprolol succinate 25 MG 24 hr tablet Commonly known as: TOPROL-XL Take 1 tablet (25 mg total) by mouth daily.   mirtazapine 15 MG tablet Commonly known as: REMERON Take 1 tablet (15 mg total) by mouth at bedtime. What changed:   medication strength  how much to take Changed by: Claretta Fraise, MD   multivitamin with minerals tablet Take 1 tablet by mouth daily.   pantoprazole 40 MG tablet Commonly known as: PROTONIX Take 1 tablet (40  mg total) by mouth 2 (two) times daily.   spironolactone 25 MG tablet Commonly known as: Aldactone Take 1 tablet (25 mg total) by mouth daily.        Follow-up: Return in about 3 months (around 04/29/2020), or if symptoms worsen or fail to improve.  Claretta Fraise, M.D.

## 2020-01-29 ENCOUNTER — Encounter: Payer: Self-pay | Admitting: Family Medicine

## 2020-01-29 DIAGNOSIS — R0602 Shortness of breath: Secondary | ICD-10-CM | POA: Insufficient documentation

## 2020-01-29 DIAGNOSIS — G479 Sleep disorder, unspecified: Secondary | ICD-10-CM | POA: Insufficient documentation

## 2020-02-01 ENCOUNTER — Telehealth: Payer: Self-pay | Admitting: Family Medicine

## 2020-02-02 ENCOUNTER — Encounter: Payer: Self-pay | Admitting: Family Medicine

## 2020-02-02 NOTE — Telephone Encounter (Signed)
Patient's son notes confusion at night and in mornings that was not noticed before.  He continues to struggle with sleep.  He notes Friday morning patient came into the bedroom and seemed agitated and was disoriented.  During the rest of the day he became clearer and became depressed about being confused.  This recurred on Saturday morning.  He notes patient has been feeling insecure.  On the night he started the Mirtazapine he even acted like he did not know where he was and needed someone to guide him to his bedroom.   He wonders if these changes are related to Mirtazapine or if they are just progression of his symptoms. Tara stays with him in the evening.  Ok to stop Mirtazapine.  Monitor for changes.  If improved, ok to keep off Mirtazapine.  If not, will proceed with neurocognitive testing as planned.  Either [name] or Jeffrey's sister will be coming to the 4/12 appt.  Encouraged to contact me with any additional concerns/ questions.  Tai Syfert M. Lajuana Ripple, [facility]

## 2020-02-04 ENCOUNTER — Inpatient Hospital Stay (HOSPITAL_COMMUNITY)
Admission: EM | Admit: 2020-02-04 | Discharge: 2020-02-09 | DRG: 292 | Disposition: A | Discharge status: Skilled Nursing Facility | Payer: Medicare Other | Attending: Internal Medicine | Admitting: Internal Medicine

## 2020-02-04 ENCOUNTER — Emergency Department (HOSPITAL_COMMUNITY): Payer: Medicare Other

## 2020-02-04 DIAGNOSIS — I2510 Atherosclerotic heart disease of native coronary artery without angina pectoris: Secondary | ICD-10-CM | POA: Diagnosis present

## 2020-02-04 DIAGNOSIS — F0390 Unspecified dementia without behavioral disturbance: Secondary | ICD-10-CM | POA: Diagnosis present

## 2020-02-04 DIAGNOSIS — F329 Major depressive disorder, single episode, unspecified: Secondary | ICD-10-CM | POA: Diagnosis not present

## 2020-02-04 DIAGNOSIS — Z888 Allergy status to other drugs, medicaments and biological substances status: Secondary | ICD-10-CM

## 2020-02-04 DIAGNOSIS — R5381 Other malaise: Secondary | ICD-10-CM | POA: Diagnosis not present

## 2020-02-04 DIAGNOSIS — K449 Diaphragmatic hernia without obstruction or gangrene: Secondary | ICD-10-CM | POA: Diagnosis not present

## 2020-02-04 DIAGNOSIS — I110 Hypertensive heart disease with heart failure: Secondary | ICD-10-CM | POA: Diagnosis not present

## 2020-02-04 DIAGNOSIS — Z91041 Radiographic dye allergy status: Secondary | ICD-10-CM | POA: Diagnosis not present

## 2020-02-04 DIAGNOSIS — F05 Delirium due to known physiological condition: Secondary | ICD-10-CM | POA: Diagnosis not present

## 2020-02-04 DIAGNOSIS — Z7289 Other problems related to lifestyle: Secondary | ICD-10-CM

## 2020-02-04 DIAGNOSIS — I255 Ischemic cardiomyopathy: Secondary | ICD-10-CM | POA: Diagnosis present

## 2020-02-04 DIAGNOSIS — Z882 Allergy status to sulfonamides status: Secondary | ICD-10-CM

## 2020-02-04 DIAGNOSIS — G4730 Sleep apnea, unspecified: Secondary | ICD-10-CM | POA: Diagnosis present

## 2020-02-04 DIAGNOSIS — I252 Old myocardial infarction: Secondary | ICD-10-CM

## 2020-02-04 DIAGNOSIS — K219 Gastro-esophageal reflux disease without esophagitis: Secondary | ICD-10-CM | POA: Diagnosis not present

## 2020-02-04 DIAGNOSIS — I482 Chronic atrial fibrillation, unspecified: Secondary | ICD-10-CM | POA: Diagnosis not present

## 2020-02-04 DIAGNOSIS — Z820 Family history of epilepsy and other diseases of the nervous system: Secondary | ICD-10-CM

## 2020-02-04 DIAGNOSIS — Z8249 Family history of ischemic heart disease and other diseases of the circulatory system: Secondary | ICD-10-CM | POA: Diagnosis not present

## 2020-02-04 DIAGNOSIS — R778 Other specified abnormalities of plasma proteins: Secondary | ICD-10-CM | POA: Diagnosis not present

## 2020-02-04 DIAGNOSIS — R0602 Shortness of breath: Secondary | ICD-10-CM

## 2020-02-04 DIAGNOSIS — M6281 Muscle weakness (generalized): Secondary | ICD-10-CM | POA: Diagnosis not present

## 2020-02-04 DIAGNOSIS — E785 Hyperlipidemia, unspecified: Secondary | ICD-10-CM | POA: Diagnosis present

## 2020-02-04 DIAGNOSIS — I5023 Acute on chronic systolic (congestive) heart failure: Secondary | ICD-10-CM | POA: Diagnosis not present

## 2020-02-04 DIAGNOSIS — I4891 Unspecified atrial fibrillation: Secondary | ICD-10-CM | POA: Diagnosis not present

## 2020-02-04 DIAGNOSIS — Z955 Presence of coronary angioplasty implant and graft: Secondary | ICD-10-CM | POA: Diagnosis not present

## 2020-02-04 DIAGNOSIS — R0902 Hypoxemia: Secondary | ICD-10-CM | POA: Diagnosis not present

## 2020-02-04 DIAGNOSIS — Z7901 Long term (current) use of anticoagulants: Secondary | ICD-10-CM

## 2020-02-04 DIAGNOSIS — Z96651 Presence of right artificial knee joint: Secondary | ICD-10-CM | POA: Diagnosis present

## 2020-02-04 DIAGNOSIS — I10 Essential (primary) hypertension: Secondary | ICD-10-CM | POA: Diagnosis not present

## 2020-02-04 DIAGNOSIS — Z87891 Personal history of nicotine dependence: Secondary | ICD-10-CM

## 2020-02-04 DIAGNOSIS — N400 Enlarged prostate without lower urinary tract symptoms: Secondary | ICD-10-CM | POA: Diagnosis not present

## 2020-02-04 DIAGNOSIS — I5021 Acute systolic (congestive) heart failure: Secondary | ICD-10-CM | POA: Diagnosis not present

## 2020-02-04 DIAGNOSIS — Z79899 Other long term (current) drug therapy: Secondary | ICD-10-CM | POA: Diagnosis not present

## 2020-02-04 DIAGNOSIS — F419 Anxiety disorder, unspecified: Secondary | ICD-10-CM | POA: Diagnosis not present

## 2020-02-04 DIAGNOSIS — Z20822 Contact with and (suspected) exposure to covid-19: Secondary | ICD-10-CM | POA: Diagnosis not present

## 2020-02-04 DIAGNOSIS — R41841 Cognitive communication deficit: Secondary | ICD-10-CM | POA: Diagnosis not present

## 2020-02-04 DIAGNOSIS — R531 Weakness: Secondary | ICD-10-CM | POA: Diagnosis not present

## 2020-02-04 DIAGNOSIS — R7989 Other specified abnormal findings of blood chemistry: Secondary | ICD-10-CM | POA: Diagnosis not present

## 2020-02-04 DIAGNOSIS — Z7401 Bed confinement status: Secondary | ICD-10-CM | POA: Diagnosis not present

## 2020-02-04 DIAGNOSIS — M2550 Pain in unspecified joint: Secondary | ICD-10-CM | POA: Diagnosis not present

## 2020-02-04 HISTORY — DX: Heart failure, unspecified: I50.9

## 2020-02-04 HISTORY — DX: Personal history of urinary calculi: Z87.442

## 2020-02-04 LAB — CBC
HCT: 47.6 % (ref 39.0–52.0)
Hemoglobin: 15.5 g/dL (ref 13.0–17.0)
MCH: 33 pg (ref 26.0–34.0)
MCHC: 32.6 g/dL (ref 30.0–36.0)
MCV: 101.5 fL — ABNORMAL HIGH (ref 80.0–100.0)
Platelets: 170 10*3/uL (ref 150–400)
RBC: 4.69 MIL/uL (ref 4.22–5.81)
RDW: 13.5 % (ref 11.5–15.5)
WBC: 7.2 10*3/uL (ref 4.0–10.5)
nRBC: 0 % (ref 0.0–0.2)

## 2020-02-04 LAB — POC SARS CORONAVIRUS 2 AG -  ED: SARS Coronavirus 2 Ag: NEGATIVE

## 2020-02-04 LAB — URINALYSIS, ROUTINE W REFLEX MICROSCOPIC
Bacteria, UA: NONE SEEN
Bilirubin Urine: NEGATIVE
Glucose, UA: NEGATIVE mg/dL
Ketones, ur: 5 mg/dL — AB
Leukocytes,Ua: NEGATIVE
Nitrite: NEGATIVE
Protein, ur: 30 mg/dL — AB
Specific Gravity, Urine: 1.023 (ref 1.005–1.030)
pH: 5 (ref 5.0–8.0)

## 2020-02-04 LAB — BASIC METABOLIC PANEL
Anion gap: 11 (ref 5–15)
BUN: 20 mg/dL (ref 8–23)
CO2: 28 mmol/L (ref 22–32)
Calcium: 9.1 mg/dL (ref 8.9–10.3)
Chloride: 102 mmol/L (ref 98–111)
Creatinine, Ser: 1.14 mg/dL (ref 0.61–1.24)
GFR calc Af Amer: >60 mL/min (ref >60)
GFR calc non Af Amer: 58 mL/min — ABNORMAL LOW (ref >60)
Glucose, Bld: 124 mg/dL — ABNORMAL HIGH (ref 70–99)
Potassium: 4 mmol/L (ref 3.5–5.1)
Sodium: 141 mmol/L (ref 135–145)

## 2020-02-04 LAB — BRAIN NATRIURETIC PEPTIDE: B Natriuretic Peptide: 756.6 pg/mL — ABNORMAL HIGH (ref 0.0–100.0)

## 2020-02-04 LAB — TROPONIN I (HIGH SENSITIVITY): Troponin I (High Sensitivity): 65 ng/L — ABNORMAL HIGH (ref <18)

## 2020-02-04 MED ORDER — SODIUM CHLORIDE 0.9% FLUSH: 3.0000 mL | Freq: Once | INTRAVENOUS | Status: DC

## 2020-02-04 MED ORDER — FUROSEMIDE 10 MG/ML IJ SOLN: 20.0000 mg | Freq: Once | INTRAMUSCULAR | Status: DC

## 2020-02-04 MED ORDER — FUROSEMIDE 10 MG/ML IJ SOLN
40.0000 mg | Freq: Once | INTRAMUSCULAR | Status: AC
  Administered 2020-02-04: 40 mg via INTRAVENOUS
  Filled 2020-02-04: qty 4

## 2020-02-04 NOTE — ED Notes (Signed)
Pt to and from CT

## 2020-02-04 NOTE — ED Triage Notes (Signed)
Pt arrives pov with complaints of generalized weakness. Pt states progressively worsening over the last few days.

## 2020-02-04 NOTE — ED Notes (Signed)
Pt resting in bed. Pt denies new or worsening complaints. Will continue to monitor. No distress noted. Pt on continuous monitoring via blood pressure, pulse ox, and cardiac monitor.  

## 2020-02-04 NOTE — ED Notes (Addendum)
Pt wandering around the lobby looking for his caregiver [name]. Pt reports he is trying to reach his caregiver who dropped him off here. Pt states he came here to get blood worked checked. Pt seems confused, walking around looking for her - doesn't know any numbers to get a hold of anyone.  Spoke with [name] - caretaker over phone. Reports that pt is in early stages of dementia and lives independently. Has noticed that he has been short winded lately with right leg swelling. [name] reports she can be notified for further information or updates. [place] [employer]  [phone]

## 2020-02-04 NOTE — H&P (Signed)
History and Physical    Henry Ayala [id number] DOB: 06-Oct-1931 DOA: 02/04/2020  PCP: [name], DO   Patient coming from: Home   Chief Complaint: SOB, right leg swelling   HPI: Henry Ayala is a 86 y.o. male with medical history significant for atrial fibrillation on Eliquis, coronary artery disease, chronic systolic CHF, and large hiatal hernia, now presenting to the emergency department for evaluation of exertional dyspnea and right lower leg swelling.  Patient has a part-time caregiver who assists with the history and reports that the patient has been appearing dyspneic with minimal exertion for the past couple days and she also noted swelling involving the lower right leg yesterday.  Patient himself has not been complaining of anything.  His caregiver reports that he has been developing dementia and has had more episodes of confusion recently.  There was some concern that recent addition of Remeron might be contributing to his worsening confusion and this was stopped.  His caregiver administers his medications once a day and then sets out his twice daily medications for the patient to take on his own, but she notes that he sometimes forgets to.  ED Course: Upon arrival to the ED, patient is found to be afebrile, saturating mid 90s on room air, and with stable blood pressure.  EKG features atrial fibrillation with nonspecific IVCD.  Noncontrast head CT is negative for acute intracranial abnormality.  Chest x-ray with bibasilar atelectasis versus infiltrate and large hiatal hernia that is similar to prior study.  Chemistry panel is unremarkable, CBC with a mild microcytosis, troponin 65, and BNP 757.  Covid PCR screening test has not yet resulted.  Patient was given a dose of 40 mg IV Lasix in the ED.  Review of Systems:  All other systems reviewed and apart from HPI, are negative.  Past Medical History:  Diagnosis Date  . Anteroseptal myocardial infarction [facility])    PTCA  to LAD 1992  . BPH (benign prostatic hyperplasia)   . CAD (coronary artery disease)    PTCA to LAD 1992, BMS to RCA 2004  . Chronic atrial fibrillation ([place])   . Essential hypertension   . GERD (gastroesophageal reflux disease)   . GI bleed    March 2017 - esophagitis  . Hiatal hernia   . Hyperlipidemia   . Hypokalemia   . Hypomagnesemia   . Ischemic cardiomyopathy    [name] 48% 2015  . Leukocytosis 05/09/2016  . Myocardial infarction, inferior wall ([name])    BMS to proximal RCA 2004 at [facility]  . Nephrolithiasis    stones   . Sleep apnea    On CPAP    Past Surgical History:  Procedure Laterality Date  . CARDIOVERSION N/A 06/30/2014   Procedure: CARDIOVERSION;  Surgeon: Sanda Klein, MD;  Location: [facility] ENDOSCOPY;  Service: Cardiovascular;  Laterality: N/A;  . ESOPHAGOGASTRODUODENOSCOPY Left 02/14/2016   Procedure: ESOPHAGOGASTRODUODENOSCOPY (EGD);  Surgeon: Wonda Horner, MD;  Location: [facility];  Service: Endoscopy;  Laterality: Left;  . EYE SURGERY Bilateral   . TONSILLECTOMY    . TOTAL KNEE ARTHROPLASTY     right     reports that he has never smoked. He has never used smokeless tobacco. He reports current alcohol use. He reports that he does not use drugs.  Allergies  Allergen Reactions  . Iodinated Diagnostic Agents     unknown  . Lipitor [Atorvastatin Calcium]     unknown  . Sulfa Drugs Cross Reactors     unknown  Family History  Problem Relation Age of Onset  . Heart attack Father   . Stroke Mother   . Cancer Mother        colon  . Heart attack Brother 70  . Cancer Brother        prostate  . Cancer Sister        colon  . Parkinson's disease Sister   . Stroke Maternal Grandmother   . Cancer Daughter        unknown origin   . GI problems Son      Prior to Admission medications   Medication Sig Start Date End Date Taking? Authorizing Provider  alum & mag hydroxide-simeth (MAALOX/MYLANTA) 200-200-20 MG/5ML suspension Take 30 mLs by mouth every 6  (six) hours as needed for indigestion or heartburn. 05/12/16   Dhungel, Flonnie Overman, MD  apixaban (ELIQUIS) 5 MG TABS tablet Take 1 tablet (5 mg total) by mouth 2 (two) times daily. 09/15/19   [name], DO  bismuth subsalicylate (PEPTO BISMOL) 262 MG/15ML suspension Take 30 mLs by mouth every 6 (six) hours as needed for diarrhea or loose stools.    [provider]  doxazosin (CARDURA) 2 MG tablet Take 1 tablet (2 mg total) by mouth daily. 09/15/19   [name], DO  lovastatin (MEVACOR) 40 MG tablet Take 1 tablet (40 mg total) by mouth daily. 09/15/19   [name], DO  metoprolol succinate (TOPROL-XL) 25 MG 24 hr tablet Take 1 tablet (25 mg total) by mouth daily. 01/11/20   [name], DO  mirtazapine (REMERON) 15 MG tablet Take 1 tablet (15 mg total) by mouth at bedtime. 01/28/20   Claretta Fraise, MD  Multiple Vitamins-Minerals (MULTIVITAMIN WITH MINERALS) tablet Take 1 tablet by mouth daily.    [provider]  pantoprazole (PROTONIX) 40 MG tablet Take 1 tablet (40 mg total) by mouth 2 (two) times daily. 05/26/19   [name], DO  spironolactone (ALDACTONE) 25 MG tablet Take 1 tablet (25 mg total) by mouth daily. 09/15/19   [name], DO    Physical Exam: Vitals:   02/04/20 1736  BP: (!) 131/96  Pulse: 94  Resp: 16  Temp: (!) 97.5 F (36.4 C)  TempSrc: Oral  SpO2: 97%  Weight: 72.6 kg  Height: 5\' 7"  (1.702 m)    Constitutional: NAD, calm  Eyes: PERTLA, lids and conjunctivae normal ENMT: Mucous membranes are moist. Posterior pharynx clear of any exudate or lesions.   Neck: normal, supple, no masses, no thyromegaly Respiratory: Dyspneic with speech, no wheezing. No accessory muscle use.  Cardiovascular: Rate ~80 and irregularly irregular. No extremity edema. Abdomen: No distension, no tenderness, soft. Bowel sounds active.  Musculoskeletal: no clubbing / cyanosis. No joint deformity upper and lower extremities.   Skin: no  significant rashes, lesions, ulcers. Warm, dry, well-perfused. Neurologic: CN 2-12 grossly intact. Sensation intact. Strength 5/5 in all 4 limbs.  Psychiatric: Alert and oriented to person and place only. Pleasant and cooperative.    Labs and Imaging on Admission: I have personally reviewed following labs and imaging studies  CBC: Recent Labs  Lab 02/04/20 1804  WBC 7.2  HGB 15.5  HCT 47.6  MCV 101.5*  PLT 123XX123   Basic Metabolic Panel: Recent Labs  Lab 02/04/20 1804  NA 141  K 4.0  CL 102  CO2 28  GLUCOSE 124*  BUN 20  CREATININE 1.14  CALCIUM 9.1   GFR: Estimated Creatinine Clearance: 43.5 mL/min (by C-G formula  based on SCr of 1.14 mg/dL). Liver Function Tests: No results for input(s): AST, ALT, ALKPHOS, BILITOT, PROT, ALBUMIN in the last 168 hours. No results for input(s): LIPASE, AMYLASE in the last 168 hours. No results for input(s): AMMONIA in the last 168 hours. Coagulation Profile: No results for input(s): INR, PROTIME in the last 168 hours. Cardiac Enzymes: No results for input(s): CKTOTAL, CKMB, CKMBINDEX, TROPONINI in the last 168 hours. BNP (last 3 results) No results for input(s): PROBNP in the last 8760 hours. HbA1C: No results for input(s): HGBA1C in the last 72 hours. CBG: No results for input(s): GLUCAP in the last 168 hours. Lipid Profile: No results for input(s): CHOL, HDL, LDLCALC, TRIG, CHOLHDL, LDLDIRECT in the last 72 hours. Thyroid Function Tests: No results for input(s): TSH, T4TOTAL, FREET4, T3FREE, THYROIDAB in the last 72 hours. Anemia Panel: No results for input(s): VITAMINB12, FOLATE, FERRITIN, TIBC, IRON, RETICCTPCT in the last 72 hours. Urine analysis:    Component Value Date/Time   COLORURINE YELLOW 02/04/2020 2115   APPEARANCEUR CLEAR 02/04/2020 2115   LABSPEC 1.023 02/04/2020 2115   PHURINE 5.0 02/04/2020 2115   GLUCOSEU NEGATIVE 02/04/2020 2115   HGBUR SMALL (A) 02/04/2020 2115   BILIRUBINUR NEGATIVE 02/04/2020 2115    KETONESUR 5 (A) 02/04/2020 2115   PROTEINUR 30 (A) 02/04/2020 2115   NITRITE NEGATIVE 02/04/2020 2115   LEUKOCYTESUR NEGATIVE 02/04/2020 2115   Sepsis Labs: @LABRCNTIP (procalcitonin:4,lacticidven:4) )No results found for this or any previous visit (from the past 240 hour(s)).   Radiological Exams on Admission: CT Head Wo Contrast  Result Date: 02/04/2020 CLINICAL DATA:  Generalized weakness. EXAM: CT HEAD WITHOUT CONTRAST TECHNIQUE: Contiguous axial images were obtained from the base of the skull through the vertex without intravenous contrast. COMPARISON:  February 12, 2016 FINDINGS: Brain: There is mild cerebral atrophy with widening of the extra-axial spaces and ventricular dilatation. There are areas of decreased attenuation within the white matter tracts of the supratentorial brain, consistent with microvascular disease changes. Vascular: No hyperdense vessel or unexpected calcification. Skull: Normal. Negative for fracture or focal lesion. Sinuses/Orbits: No acute finding. Other: None. IMPRESSION: 1. Generalized cerebral atrophy. 2. No acute intracranial abnormality. Electronically Signed   By: Virgina Norfolk M.D.   On: 02/04/2020 23:23   DG Chest Portable 1 View  Result Date: 02/04/2020 CLINICAL DATA:  86 year old male with shortness of breath EXAM: PORTABLE CHEST 1 VIEW COMPARISON:  Chest radiograph dated 01/28/2020. FINDINGS: Bilateral lower lung field streaky densities, likely atelectasis. Pneumonia is not excluded. Clinical correlation is recommended. No large pleural effusion or pneumothorax. Stable cardiomegaly. Atherosclerotic calcification of the aorta. Large hiatal hernia containing distended bowel as seen on the prior radiograph. Associated obstruction is not excluded on this radiograph. IMPRESSION: 1. Bibasilar atelectasis versus infiltrate. 2. Large hiatal hernia containing distended bowel similar to prior radiograph. Electronically Signed   By: Anner Crete M.D.   On:  02/04/2020 20:43    EKG: Independently reviewed. Atrial fibrillation, non-specific IVCD.   Assessment/Plan  1. Exertional dyspnea  - Presents with a a couple days of exertional dyspnea and caretaker reports that his right lower leg had been swollen yesterday  - In ED, HS troponin is 65, BNP 757, and CXR with atelectasis vs infiltrate  - He was given Lasix 40 mg IV in ED  - Plan to check d-dimer given some missed Eliquis doses and reports of recent right leg swelling, trend troponin, update echo, start incentive spirometry, check procalcitonin   2. Chronic systolic CHF  -  Presents with DOE, BNP elevated higher than in 2018, but he does not appear grossly hypervolemic  - Echo from 2018 with EF 25-30%  - He was given Lasix 40 mg IV in ED  - Follow I/Os and daily wts, resume his usual Aldactone in am, update echo    3. Atrial fibrillation  - CHADS-VASC at least 52 (age x2, CAD, CHF)  - Continue Eliquis, Toprol    4. Hiatal hernia  - Appears stable on CXR  - Continue PPI    5. CAD; elevated troponin  - Denies chest pain but presents with DOE and found to have HS troponin of 65  - Continue cardiac monitoring, trend troponin, echo as above     DVT prophylaxis: Eliquis  Code Status: Full, confirmed with patient  Family Communication: Discussed with patient and his caretaker, Maude Leriche Disposition Plan: From home with part time caretaker, likely needs 24-hour supervision and TOC consulted  Consults called: None Admission status: Inpatient     Vianne Bulls, MD [facility] Pager: See [email or web address]  If 7AM-7PM, please contact the daytime attending [email or web address]  02/05/2020, 12:50 AM

## 2020-02-04 NOTE — ED Provider Notes (Signed)
[facility] EMERGENCY DEPARTMENT Provider Note   CSN: [id number] Arrival date & time: 02/04/20  1733     History Chief Complaint  Patient presents with  . Weakness    Henry Ayala is a 86 y.o. male.  HPI Patient is an 86 year old male with a PMH of hyperlipidemia, chronic A. fib on Xarelto, hypertension, CAD and CHF (EF 25-30%) presenting to the ED today due to generalized weakness.  Patient reports shortness of breath that has been worsening for the past 2 weeks.  But otherwise, he has no complaints.  He denies chest pain, fever, chills, nausea, vomiting or cough.  Patient's caregiver, [name], says that patient has had significant dyspnea on exertion for the past 2 weeks.  She says that he can barely go from the couch to the bathroom without having to stop to catch his breath for a prolonged period of time.  She also noted right ankle swelling yesterday and she was concerned about him being volume overloaded.  She has noticed a gradual decline in his mental status over the past couple of weeks.    Past Medical History:  Diagnosis Date  . Anteroseptal myocardial infarction [facility])    PTCA to LAD 1992  . BPH (benign prostatic hyperplasia)   . CAD (coronary artery disease)    PTCA to LAD 1992, BMS to RCA 2004  . Chronic atrial fibrillation ([place])   . Essential hypertension   . GERD (gastroesophageal reflux disease)   . GI bleed    March 2017 - esophagitis  . Hiatal hernia   . Hyperlipidemia   . Hypokalemia   . Hypomagnesemia   . Ischemic cardiomyopathy    [name] 48% 2015  . Leukocytosis 05/09/2016  . Myocardial infarction, inferior wall ([name])    BMS to proximal RCA 2004 at [facility]  . Nephrolithiasis    stones   . Sleep apnea    On CPAP    Patient Active Problem List   Diagnosis Date Noted  . Dyspnea 01/29/2020  . Sleep disturbance 01/29/2020  . Chronic systolic congestive heart failure ([place]) 09/05/2017  . Neoplasm of uncertain behavior of skin 01/25/2017   . Depression 07/19/2016  . CAD (coronary artery disease) 05/09/2016  . Diaphragmatic hernia without obstruction and without gangrene 05/09/2016  . Memory loss 04/17/2016  . Normocytic anemia 03/12/2016  . Upper GI bleed 02/12/2016  . Benign essential HTN 02/12/2016  . Anticoagulant long-term use 02/12/2016  . Chronic atrial fibrillation ([place]) 04/28/2014  . Inguinal hernia 04/28/2014  . Sleep apnea 10/26/2013  . Ischemic heart disease 04/27/2011  . Hypercholesterolemia 04/27/2011  . GERD (gastroesophageal reflux disease) 04/27/2011  . BPH (benign prostatic hyperplasia) 04/27/2011    Past Surgical History:  Procedure Laterality Date  . CARDIOVERSION N/A 06/30/2014   Procedure: CARDIOVERSION;  Surgeon: Sanda Klein, MD;  Location: [facility] ENDOSCOPY;  Service: Cardiovascular;  Laterality: N/A;  . ESOPHAGOGASTRODUODENOSCOPY Left 02/14/2016   Procedure: ESOPHAGOGASTRODUODENOSCOPY (EGD);  Surgeon: Wonda Horner, MD;  Location: [facility];  Service: Endoscopy;  Laterality: Left;  . EYE SURGERY Bilateral   . TONSILLECTOMY    . TOTAL KNEE ARTHROPLASTY     right       Family History  Problem Relation Age of Onset  . Heart attack Father   . Stroke Mother   . Cancer Mother        colon  . Heart attack Brother 93  . Cancer Brother        prostate  . Cancer Sister  colon  . Parkinson's disease Sister   . Stroke Maternal Grandmother   . Cancer Daughter        unknown origin   . GI problems Son     Social History   Tobacco Use  . Smoking status: Never Smoker  . Smokeless tobacco: Never Used  Substance Use Topics  . Alcohol use: Yes    Alcohol/week: 0.0 standard drinks    Comment: occasionally drink  . Drug use: No    Home Medications Prior to Admission medications   Medication Sig Start Date End Date Taking? Authorizing Provider  alum & mag hydroxide-simeth (MAALOX/MYLANTA) 200-200-20 MG/5ML suspension Take 30 mLs by mouth every 6 (six) hours as needed for  indigestion or heartburn. 05/12/16   Dhungel, Flonnie Overman, MD  apixaban (ELIQUIS) 5 MG TABS tablet Take 1 tablet (5 mg total) by mouth 2 (two) times daily. 09/15/19   [name], DO  bismuth subsalicylate (PEPTO BISMOL) 262 MG/15ML suspension Take 30 mLs by mouth every 6 (six) hours as needed for diarrhea or loose stools.    [provider]  doxazosin (CARDURA) 2 MG tablet Take 1 tablet (2 mg total) by mouth daily. 09/15/19   [name], DO  lovastatin (MEVACOR) 40 MG tablet Take 1 tablet (40 mg total) by mouth daily. 09/15/19   [name], DO  metoprolol succinate (TOPROL-XL) 25 MG 24 hr tablet Take 1 tablet (25 mg total) by mouth daily. 01/11/20   [name], DO  mirtazapine (REMERON) 15 MG tablet Take 1 tablet (15 mg total) by mouth at bedtime. 01/28/20   Claretta Fraise, MD  Multiple Vitamins-Minerals (MULTIVITAMIN WITH MINERALS) tablet Take 1 tablet by mouth daily.    [provider]  pantoprazole (PROTONIX) 40 MG tablet Take 1 tablet (40 mg total) by mouth 2 (two) times daily. 05/26/19   [name], DO  spironolactone (ALDACTONE) 25 MG tablet Take 1 tablet (25 mg total) by mouth daily. 09/15/19   [name], DO    Allergies    Iodinated diagnostic agents, Lipitor [atorvastatin calcium], and Sulfa drugs cross reactors  Review of Systems   Review of Systems  Constitutional: Positive for fatigue. Negative for chills and fever.  HENT: Negative for rhinorrhea and sore throat.   Eyes: Negative for photophobia and visual disturbance.  Respiratory: Positive for shortness of breath. Negative for cough and wheezing.   Cardiovascular: Negative for chest pain and palpitations.  Gastrointestinal: Negative for abdominal pain, diarrhea, nausea and vomiting.  Genitourinary: Negative for dysuria and hematuria.  Musculoskeletal: Negative for arthralgias and back pain.  Skin: Negative for rash and wound.  Neurological: Negative for weakness and  headaches.  Psychiatric/Behavioral: Negative for agitation.  All other systems reviewed and are negative.   Physical Exam Updated Vital Signs BP (!) 131/96 (BP Location: Left Arm)   Pulse 94   Temp (!) 97.5 F (36.4 C) (Oral)   Resp 16   Ht 5\' 7"  (1.702 m)   Wt 72.6 kg   SpO2 97%   BMI 25.06 kg/m   Physical Exam Vitals and nursing note reviewed.  Constitutional:      General: He is not in acute distress.    Appearance: Normal appearance. He is well-developed and normal weight. He is not ill-appearing.  HENT:     Head: Normocephalic and atraumatic.     Right Ear: External ear normal.     Left Ear: External ear normal.     Nose: Nose normal. No  congestion or rhinorrhea.     Mouth/Throat:     Mouth: Mucous membranes are moist.     Pharynx: Oropharynx is clear.  Eyes:     Extraocular Movements: Extraocular movements intact.     Pupils: Pupils are equal, round, and reactive to light.  Cardiovascular:     Rate and Rhythm: Normal rate and regular rhythm.     Pulses: Normal pulses.     Heart sounds: Normal heart sounds.  Pulmonary:     Effort: Pulmonary effort is normal. No respiratory distress.     Breath sounds: Normal breath sounds. No stridor. No wheezing, rhonchi or rales.     Comments: Lung sounds diminished in bases bilaterally. Abdominal:     General: There is no distension.     Palpations: Abdomen is soft.     Tenderness: There is no abdominal tenderness.  Musculoskeletal:        General: Normal range of motion.     Cervical back: Normal range of motion and neck supple.     Right lower leg: No edema.     Left lower leg: No edema.  Skin:    General: Skin is warm and dry.     Capillary Refill: Capillary refill takes less than 2 seconds.  Neurological:     General: No focal deficit present.     Mental Status: He is alert and oriented to person, place, and time.     Cranial Nerves: No cranial nerve deficit.     Sensory: No sensory deficit.     Motor: No  weakness.  Psychiatric:        Mood and Affect: Mood normal.     ED Results / Procedures / Treatments   Labs (all labs ordered are listed, but only abnormal results are displayed) Labs Reviewed  BASIC METABOLIC PANEL - Abnormal; Notable for the following components:      Result Value   Glucose, Bld 124 (*)    GFR calc non Af Amer 58 (*)    All other components within normal limits  CBC - Abnormal; Notable for the following components:   MCV 101.5 (*)    All other components within normal limits  URINALYSIS, ROUTINE W REFLEX MICROSCOPIC  BRAIN NATRIURETIC PEPTIDE  POC SARS CORONAVIRUS 2 AG -  ED  TROPONIN I (HIGH SENSITIVITY)    EKG EKG Interpretation  Date/Time:  Thursday February 04 2020 17:50:18 EDT Ventricular Rate:  88 PR Interval:    QRS Duration: 162 QT Interval:  412 QTC Calculation: 498 R Axis:   -96 Text Interpretation: Atrial fibrillation Non-specific intra-ventricular conduction block Minimal voltage criteria for LVH, may be normal variant ( Cornell product ) Possible Lateral infarct , age undetermined Abnormal ECG Confirmed by Lacretia Leigh ([id number]) on 02/04/2020 8:19:59 PM   Radiology DG Chest Portable 1 View  Result Date: 02/04/2020 CLINICAL DATA:  86 year old male with shortness of breath EXAM: PORTABLE CHEST 1 VIEW COMPARISON:  Chest radiograph dated 01/28/2020. FINDINGS: Bilateral lower lung field streaky densities, likely atelectasis. Pneumonia is not excluded. Clinical correlation is recommended. No large pleural effusion or pneumothorax. Stable cardiomegaly. Atherosclerotic calcification of the aorta. Large hiatal hernia containing distended bowel as seen on the prior radiograph. Associated obstruction is not excluded on this radiograph. IMPRESSION: 1. Bibasilar atelectasis versus infiltrate. 2. Large hiatal hernia containing distended bowel similar to prior radiograph. Electronically Signed   By: Anner Crete M.D.   On: 02/04/2020 20:43     Procedures Procedures (including critical care time)  Medications Ordered in ED Medications  sodium chloride flush (NS) 0.9 % injection 3 mL (has no administration in time range)    ED Course  I have reviewed the triage vital signs and the nursing notes.  Pertinent labs & imaging results that were available during my care of the patient were reviewed by me and considered in my medical decision making (see chart for details).    MDM Rules/Calculators/A&P                     Patient is an 86 year old male with a PMH of hyperlipidemia, chronic A. fib on Xarelto, hypertension, CAD and CHF (EF 25-30%) presenting to the ED today due to generalized weakness.  Physical exam unremarkable.  BP 131/96, HR 94, RR 16, SPO2 97% on room air.  Afebrile.  On arrival, patient appears generally well and is displaying no signs of acute distress.  Patient's only complaint is shortness of breath but his caretaker, [name], is concerned about dyspnea on exertion and possible leg swelling.  I do not appreciate edema in either of his lower extremities.  EKG shows atrial fibrillation with T wave inversions in aVR, aVL which is consistent with previous.  Chest x-ray shows bibasilar atelectasis versus infiltrate.  Obtained a CT head given new decline in mental status which showed "generalized cerebral atrophy;" however, there were no new acute injuries.   CBC and BMP unremarkable.  Urinalysis shows no signs of infection.  Collected BNP which was 756.  Initial troponin 65.  Covid negative.  Although patient does not appear clinically volume overloaded, his chest x-ray, history of worsening dyspnea on exertion and elevated BNP are concerning for CHF exacerbation.  Troponinemia likely due to demand ischemia as patient has no anginal symptoms or new ischemic changes on EKG.  Lower suspicion for PE as he is no tachypnea, tachycardia, hypoxia or chest pain.  Provided IV Lasix.  At this time, patient to be admitted to  hospitalist service.  For details on hospital course following admission, please refer to inpatient team's note.  Patient stable at time of admission.  Patient assessed and evaluated with Dr. Zenia Resides.  Nadeen Landau, MD   Final Clinical Impression(s) / ED Diagnoses Final diagnoses:  SOB (shortness of breath)    Rx / DC Orders ED Discharge Orders    None       Nadeen Landau, MD 02/05/20 0000    Lacretia Leigh, MD 02/05/20 2324

## 2020-02-04 NOTE — ED Provider Notes (Signed)
I saw and evaluated the patient, reviewed the resident's note and I agree with the findings and plan.  EKG: EKG Interpretation  Date/Time:  Thursday February 04 2020 17:50:18 EDT Ventricular Rate:  88 PR Interval:    QRS Duration: 162 QT Interval:  412 QTC Calculation: 498 R Axis:   -96 Text Interpretation: Atrial fibrillation Non-specific intra-ventricular conduction block Minimal voltage criteria for LVH, may be normal variant ( Cornell product ) Possible Lateral infarct , age undetermined Abnormal ECG Confirmed by Lacretia Leigh [phone]) on 02/04/2020 8:45:36 PM  86 year old male presents here with reported weakness.  He does have a history of early dementia.  Patient himself denies any complaints of at this time.  He has had no chest pain or shortness of breath.  His neurological exam is nonfocal.  Will discuss with family and check urine as well as head CT and labs and likely discharge   Lacretia Leigh, MD 02/04/20 2024

## 2020-02-05 ENCOUNTER — Other Ambulatory Visit: Payer: Self-pay

## 2020-02-05 ENCOUNTER — Encounter (HOSPITAL_COMMUNITY): Payer: Self-pay | Admitting: Family Medicine

## 2020-02-05 ENCOUNTER — Inpatient Hospital Stay (HOSPITAL_COMMUNITY): Payer: Medicare Other

## 2020-02-05 DIAGNOSIS — I5021 Acute systolic (congestive) heart failure: Secondary | ICD-10-CM

## 2020-02-05 DIAGNOSIS — R531 Weakness: Secondary | ICD-10-CM

## 2020-02-05 DIAGNOSIS — R7989 Other specified abnormal findings of blood chemistry: Secondary | ICD-10-CM

## 2020-02-05 DIAGNOSIS — E785 Hyperlipidemia, unspecified: Secondary | ICD-10-CM

## 2020-02-05 DIAGNOSIS — I482 Chronic atrial fibrillation, unspecified: Secondary | ICD-10-CM

## 2020-02-05 DIAGNOSIS — I10 Essential (primary) hypertension: Secondary | ICD-10-CM

## 2020-02-05 LAB — ECHOCARDIOGRAM COMPLETE
Height: 67 in
Weight: 2560 oz

## 2020-02-05 LAB — RENAL FUNCTION PANEL
Albumin: 3.3 g/dL — ABNORMAL LOW (ref 3.5–5.0)
Anion gap: 10 (ref 5–15)
BUN: 22 mg/dL (ref 8–23)
CO2: 29 mmol/L (ref 22–32)
Calcium: 9.2 mg/dL (ref 8.9–10.3)
Chloride: 101 mmol/L (ref 98–111)
Creatinine, Ser: 1.21 mg/dL (ref 0.61–1.24)
GFR calc Af Amer: >60 mL/min (ref >60)
GFR calc non Af Amer: 54 mL/min — ABNORMAL LOW (ref >60)
Glucose, Bld: 144 mg/dL — ABNORMAL HIGH (ref 70–99)
Phosphorus: 3.8 mg/dL (ref 2.5–4.6)
Potassium: 4 mmol/L (ref 3.5–5.1)
Sodium: 140 mmol/L (ref 135–145)

## 2020-02-05 LAB — D-DIMER, QUANTITATIVE: D-Dimer, Quant: 0.35 ug/mL-FEU (ref 0.00–0.50)

## 2020-02-05 LAB — PROCALCITONIN: Procalcitonin: <0.1 ng/mL

## 2020-02-05 LAB — TROPONIN I (HIGH SENSITIVITY): Troponin I (High Sensitivity): 64 ng/L — ABNORMAL HIGH (ref <18)

## 2020-02-05 LAB — SARS CORONAVIRUS 2 (TAT 6-24 HRS): SARS Coronavirus 2: NEGATIVE

## 2020-02-05 MED ORDER — SODIUM CHLORIDE 0.9 % IV SOLN: 250.0000 mL | INTRAVENOUS | Status: DC | PRN

## 2020-02-05 MED ORDER — PRAVASTATIN SODIUM 40 MG PO TABS
40.0000 mg | ORAL_TABLET | Freq: Every day | ORAL | Status: DC
  Administered 2020-02-05 – 2020-02-09 (×5): 40 mg via ORAL
  Filled 2020-02-05 (×6): qty 1

## 2020-02-05 MED ORDER — ACETAMINOPHEN 325 MG PO TABS: 650.0000 mg | ORAL_TABLET | ORAL | Status: DC | PRN

## 2020-02-05 MED ORDER — APIXABAN 5 MG PO TABS
5.0000 mg | ORAL_TABLET | Freq: Two times a day (BID) | ORAL | Status: DC
  Administered 2020-02-05 – 2020-02-09 (×9): 5 mg via ORAL
  Filled 2020-02-05 (×10): qty 1

## 2020-02-05 MED ORDER — TRAZODONE HCL 50 MG PO TABS
50.0000 mg | ORAL_TABLET | Freq: Once | ORAL | Status: AC
  Administered 2020-02-05: 50 mg via ORAL
  Filled 2020-02-05: qty 1

## 2020-02-05 MED ORDER — METOPROLOL SUCCINATE ER 25 MG PO TB24
25.0000 mg | ORAL_TABLET | Freq: Every day | ORAL | Status: DC
  Administered 2020-02-05 – 2020-02-09 (×5): 25 mg via ORAL
  Filled 2020-02-05 (×5): qty 1

## 2020-02-05 MED ORDER — SPIRONOLACTONE 25 MG PO TABS
25.0000 mg | ORAL_TABLET | Freq: Every day | ORAL | Status: DC
  Administered 2020-02-05 – 2020-02-09 (×5): 25 mg via ORAL
  Filled 2020-02-05 (×5): qty 1

## 2020-02-05 MED ORDER — PANTOPRAZOLE SODIUM 40 MG PO TBEC
40.0000 mg | DELAYED_RELEASE_TABLET | Freq: Two times a day (BID) | ORAL | Status: DC
  Administered 2020-02-05 – 2020-02-09 (×9): 40 mg via ORAL
  Filled 2020-02-05 (×9): qty 1

## 2020-02-05 MED ORDER — ONDANSETRON HCL 4 MG/2ML IJ SOLN: 4.0000 mg | Freq: Four times a day (QID) | INTRAMUSCULAR | Status: DC | PRN

## 2020-02-05 MED ORDER — SODIUM CHLORIDE 0.9% FLUSH: 3.0000 mL | INTRAVENOUS | Status: DC | PRN

## 2020-02-05 MED ORDER — FUROSEMIDE 10 MG/ML IJ SOLN
40.0000 mg | Freq: Every day | INTRAMUSCULAR | Status: DC
  Administered 2020-02-06: 40 mg via INTRAVENOUS
  Filled 2020-02-05 (×2): qty 4

## 2020-02-05 MED ORDER — SODIUM CHLORIDE 0.9% FLUSH
3.0000 mL | Freq: Two times a day (BID) | INTRAVENOUS | Status: DC
  Administered 2020-02-05 – 2020-02-09 (×7): 3 mL via INTRAVENOUS

## 2020-02-05 NOTE — ED Notes (Signed)
Pt sitting up eating breakfast.

## 2020-02-05 NOTE — Progress Notes (Signed)
[name]  PROGRESS NOTE    Henry Ayala  [id number] DOB: August 21, 1931 DOA: 02/04/2020 PCP: [name], DO   Brief Narrative:   Henry Ayala is a 86 y.o. male with medical history significant for atrial fibrillation on Eliquis, coronary artery disease, chronic systolic CHF, and large hiatal hernia, now presenting to the emergency department for evaluation of exertional dyspnea and right lower leg swelling.  Patient has a part-time caregiver who assists with the history and reports that the patient has been appearing dyspneic with minimal exertion for the past couple days and she also noted swelling involving the lower right leg yesterday.  Patient himself has not been complaining of anything.  His caregiver reports that he has been developing dementia and has had more episodes of confusion recently.  There was some concern that recent addition of Remeron might be contributing to his worsening confusion and this was stopped.  His caregiver administers his medications once a day and then sets out his twice daily medications for the patient to take on his own, but she notes that he sometimes forgets to.  3/26: Spoke with dtr about symptoms and echo findings. His respiratory status is improving on lasix and we will continue that. Echo shows worsening of EF. Will ask cards to weigh in. Dtr also concerned about his weakness and mobility at home. Will have PT/OT eval.   Assessment & Plan:   Principal Problem:   Acute on chronic systolic CHF (congestive heart failure) ([name]) Active Problems:   Chronic atrial fibrillation ([name])   CAD (coronary artery disease)   Diaphragmatic hernia without obstruction and without gangrene   SOB (shortness of breath)  Exertional dyspnea Acute exacerbation of HFrEF      - Presents with a a couple days of exertional dyspnea and caretaker reports that his right lower leg had been swollen yesterday      - trp flat at 65, 64     - BNP 756     - lasix 40mg  IV  given in ED; continue daily, follow labs     - d-dimer negative     - procal negative     - Echo with worsening HF; now [name] 15-20%; in 2018 it was 25-30%     - have consulted cards; appreciate assistance     - follow I&Os, dailt wts, continue aldactone     - fluid restriction to 1800cc   Atrial fibrillation      - CHADS-VASC at least 64 (age x2, CAD, CHF)      - Continue Eliquis, Toprol    Hiatal hernia      - Appears stable on CXR      - Continue PPI    CAD; elevated troponin      - Denies chest pain but presents with DOE and found to have HS troponin of 65      - Continue cardiac monitoring, trend troponin, echo as above     - 3/26: see above  Generalized weakness Debility     - PT/OT eval   DVT prophylaxis: eliquis Code Status: FULL Family Communication: Spoke with dtr Griffith Citron [phone]) by phone. Updated with plan.    Disposition Plan: Remains inpt for IV diuresis, PT/OT eval, ongoing w/u  Consultants:   Cardiology  ROS:  Reports some dyspnea, weakness. Remainder 10-pt ROS is negative for all not previously mentioned.  Subjective: "I've been tired."  Objective: Vitals:   02/05/20 0900 02/05/20 1021 02/05/20 1024 02/05/20 1354  BP: 114/78 103/71 103/71 (!) 154/91  Pulse: 60 85 88   Resp: (!) 23  (!) 24 19  Temp:    98.2 F (36.8 C)  TempSrc:    Oral  SpO2: 90%  93% 92%  Weight:    67 kg  Height:    5\' 7"  (1.702 m)    Intake/Output Summary (Last 24 hours) at 02/05/2020 1552 Last data filed at 02/05/2020 0736 Gross per 24 hour  Intake -  Output 2000 ml  Net -2000 ml   Filed Weights   02/04/20 1736 02/05/20 1354  Weight: 72.6 kg 67 kg    Examination:  General: 86 y.o. male resting in bed in NAD Cardiovascular:irregular,  +S1, S2, no m/g/r Respiratory: CTABL, no w/r/r, breathlessness w/ speech GI: BS+, NDNT, no masses noted, no organomegaly noted MSK: No e/c/c Neuro: Alert to name, follows commands Psyc: Appropriate interaction  and affect, calm/cooperative   Data Reviewed: I have personally reviewed following labs and imaging studies.  CBC: Recent Labs  Lab 02/04/20 1804  WBC 7.2  HGB 15.5  HCT 47.6  MCV 101.5*  PLT [id number]   Basic Metabolic Panel: Recent Labs  Lab 02/04/20 1804  NA 141  K 4.0  CL 102  CO2 28  GLUCOSE 124*  BUN 20  CREATININE 1.14  CALCIUM 9.1   GFR: Estimated Creatinine Clearance: 43.5 mL/min (by C-G formula based on SCr of 1.14 mg/dL). Liver Function Tests: No results for input(s): AST, ALT, ALKPHOS, BILITOT, PROT, ALBUMIN in the last 168 hours. No results for input(s): LIPASE, AMYLASE in the last 168 hours. No results for input(s): AMMONIA in the last 168 hours. Coagulation Profile: No results for input(s): INR, PROTIME in the last 168 hours. Cardiac Enzymes: No results for input(s): CKTOTAL, CKMB, CKMBINDEX, TROPONINI in the last 168 hours. BNP (last 3 results) No results for input(s): PROBNP in the last 8760 hours. HbA1C: No results for input(s): HGBA1C in the last 72 hours. CBG: No results for input(s): GLUCAP in the last 168 hours. Lipid Profile: No results for input(s): CHOL, HDL, LDLCALC, TRIG, CHOLHDL, LDLDIRECT in the last 72 hours. Thyroid Function Tests: No results for input(s): TSH, T4TOTAL, FREET4, T3FREE, THYROIDAB in the last 72 hours. Anemia Panel: No results for input(s): VITAMINB12, FOLATE, FERRITIN, TIBC, IRON, RETICCTPCT in the last 72 hours. Sepsis Labs: Recent Labs  Lab 02/04/20 2335  PROCALCITON <0.10    Recent Results (from the past 240 hour(s))  SARS CORONAVIRUS 2 (TAT 6-24 HRS) Nasopharyngeal Nasopharyngeal Swab     Status: None   Collection Time: 02/05/20 12:42 AM   Specimen: Nasopharyngeal Swab  Result Value Ref Range Status   SARS Coronavirus 2 NEGATIVE NEGATIVE Final    Comment: (NOTE) SARS-CoV-2 target nucleic acids are NOT DETECTED. The SARS-CoV-2 RNA is generally detectable in upper and lower respiratory specimens during the  acute phase of infection. Negative results do not preclude SARS-CoV-2 infection, do not rule out co-infections with other pathogens, and should not be used as the sole basis for treatment or other patient management decisions. Negative results must be combined with clinical observations, patient history, and epidemiological information. The expected result is Negative. Fact Sheet for Patients: SugarRoll.be Fact Sheet for Healthcare Providers: https://www.woods-mathews.com/ This test is not yet approved or cleared by the [place] FDA and  has been authorized for detection and/or diagnosis of SARS-CoV-2 by FDA under an Emergency Use Authorization (EUA). This EUA will remain  in effect (meaning this test can be used) for the  duration of the COVID-19 declaration under Section 56 4(b)(1) of the Act, 21 U.S.C. section 360bbb-3(b)(1), unless the authorization is terminated or revoked sooner. Performed at [facility], [place] [address], [place], [place] [address]       Radiology Studies: CT Head Wo Contrast  Result Date: 02/04/2020 CLINICAL DATA:  Generalized weakness. EXAM: CT HEAD WITHOUT CONTRAST TECHNIQUE: Contiguous axial images were obtained from the base of the skull through the vertex without intravenous contrast. COMPARISON:  February 12, 2016 FINDINGS: Brain: There is mild cerebral atrophy with widening of the extra-axial spaces and ventricular dilatation. There are areas of decreased attenuation within the white matter tracts of the supratentorial brain, consistent with microvascular disease changes. Vascular: No hyperdense vessel or unexpected calcification. Skull: Normal. Negative for fracture or focal lesion. Sinuses/Orbits: No acute finding. Other: None. IMPRESSION: 1. Generalized cerebral atrophy. 2. No acute intracranial abnormality. Electronically Signed   By: Virgina Norfolk M.D.   On: 02/04/2020 23:23   DG Chest Portable 1 View   Result Date: 02/04/2020 CLINICAL DATA:  86 year old male with shortness of breath EXAM: PORTABLE CHEST 1 VIEW COMPARISON:  Chest radiograph dated 01/28/2020. FINDINGS: Bilateral lower lung field streaky densities, likely atelectasis. Pneumonia is not excluded. Clinical correlation is recommended. No large pleural effusion or pneumothorax. Stable cardiomegaly. Atherosclerotic calcification of the aorta. Large hiatal hernia containing distended bowel as seen on the prior radiograph. Associated obstruction is not excluded on this radiograph. IMPRESSION: 1. Bibasilar atelectasis versus infiltrate. 2. Large hiatal hernia containing distended bowel similar to prior radiograph. Electronically Signed   By: Anner Crete M.D.   On: 02/04/2020 20:43   ECHOCARDIOGRAM COMPLETE  Result Date: 02/05/2020    ECHOCARDIOGRAM REPORT   Patient Name:   Henry Ayala Date of Exam: 02/05/2020 Medical Rec #:  [id number]        Height:       67.0 in Accession #:    [id number]       Weight:       160.0 lb Date of Birth:  May 01, 1931        BSA:          1.839 m Patient Age:    76 years         BP:           103/71 mmHg Patient Gender: M                HR:           80 bpm. Exam Location:  Inpatient Procedure: 2D Echo, 3D Echo, Color Doppler, Cardiac Doppler and Strain Analysis Indications:    [id number] Acute systolic (congestive) heart failure  History:        Patient has prior history of Echocardiogram examinations, most                 recent 08/15/2017. CHF, CAD, Arrythmias:Atrial Fibrillation; Risk                 Factors:Dyslipidemia, Hypertension and Sleep Apnea.  Sonographer:    Raquel Sarna Senior RDCS Referring Phys: [id number] [place]  1. Left ventricular ejection fraction, by estimation, is 15-20%. The left ventricle has severely decreased function. The left ventricle demonstrates global hypokinesis. The left ventricular internal cavity size was severely dilated. Left ventricular diastolic function could not be  evaluated.  2. Right ventricular systolic function is low normal. The right ventricular size is normal. There is mildly elevated pulmonary artery systolic pressure. The estimated right  ventricular systolic pressure is [id number] mmHg.  3. Left atrial size was severely dilated.  4. The mitral valve is grossly normal. Mild to moderate mitral valve regurgitation.  5. The aortic valve is tricuspid. Aortic valve regurgitation is mild. No aortic stenosis is present.  6. There is mild dilatation of the ascending aorta measuring 41 mm.  7. The inferior vena cava is normal in size with <50% respiratory variability, suggesting right atrial pressure of 8 mmHg. Comparison(s): A prior study was performed on 08/15/2017. Prior images unable to be directly viewed, comparison made by report only. Changes from prior study are noted. EF now 15-20%. FINDINGS  Left Ventricle: Left ventricular ejection fraction, by estimation, is 15-20%. The left ventricle has severely decreased function. The left ventricle demonstrates global hypokinesis. The left ventricular internal cavity size was severely dilated. There is no left ventricular hypertrophy. Abnormal (paradoxical) septal motion, consistent with left bundle branch block. Left ventricular diastolic function could not be evaluated due to atrial fibrillation. Left ventricular diastolic function could not be evaluated. Right Ventricle: The right ventricular size is normal. No increase in right ventricular wall thickness. Right ventricular systolic function is low normal. There is mildly elevated pulmonary artery systolic pressure. The tricuspid regurgitant velocity is 2.88 m/s, and with an assumed right atrial pressure of 8 mmHg, the estimated right ventricular systolic pressure is [id number] mmHg. Left Atrium: Left atrial size was severely dilated. Right Atrium: Right atrial size was normal in size. Pericardium: Trivial pericardial effusion is present. Presence of pericardial fat pad. Mitral Valve: The  mitral valve is grossly normal. Mild to moderate mitral valve regurgitation. Tricuspid Valve: The tricuspid valve is grossly normal. Tricuspid valve regurgitation is mild . No evidence of tricuspid stenosis. Aortic Valve: The aortic valve is tricuspid. Aortic valve regurgitation is mild. Aortic regurgitation PHT measures 1089 msec. No aortic stenosis is present. Pulmonic Valve: The pulmonic valve was grossly normal. Pulmonic valve regurgitation is trivial. No evidence of pulmonic stenosis. Aorta: The aortic root is normal in size and structure. There is mild dilatation of the ascending aorta measuring 41 mm. Venous: The inferior vena cava is normal in size with less than 50% respiratory variability, suggesting right atrial pressure of 8 mmHg. IAS/Shunts: No atrial level shunt detected by color flow Doppler.  LEFT VENTRICLE PLAX 2D LVIDd:         7.00 cm LVIDs:         6.40 cm LV PW:         0.80 cm LV IVS:        0.90 cm      3D Volume EF LVOT diam:     2.10 cm      LV 3D EF:    19.00 % LV SV:         35 LV SV Index:   19           3D Volume EF: LVOT Area:     3.46 cm     3D EF:        19 %  LV Volumes (MOD) LV vol d, MOD A2C: 230.0 ml LV vol d, MOD A4C: 192.0 ml LV vol s, MOD A2C: 180.0 ml LV vol s, MOD A4C: 154.0 ml LV SV MOD A2C:     50.0 ml LV SV MOD A4C:     192.0 ml LV SV MOD BP:      44.2 ml RIGHT VENTRICLE RV S prime:     10.60 cm/s TAPSE (M-mode): 1.4 cm  LEFT ATRIUM              Index       RIGHT ATRIUM           Index LA diam:        5.20 cm  2.83 cm/m  RA Area:     20.90 cm LA Vol (A2C):   130.0 ml 70.68 ml/m RA Volume:   63.00 ml  34.25 ml/m LA Vol (A4C):   102.0 ml 55.46 ml/m LA Biplane Vol: 117.0 ml 63.61 ml/m  AORTIC VALVE LVOT Vmax:   71.80 cm/s LVOT Vmean:  44.433 cm/s LVOT VTI:    0.101 m AI PHT:      1089 msec  AORTA Ao Root diam: 3.10 cm Ao Asc diam:  4.10 cm TRICUSPID VALVE TR Peak grad:   33.2 mmHg TR Vmax:        288.00 cm/s  SHUNTS Systemic VTI:  0.10 m Systemic Diam: 2.10 cm Eleonore Chiquito MD Electronically signed by Eleonore Chiquito MD Signature Date/Time: 02/05/2020/1:25:06 PM    Final      Scheduled Meds: . apixaban  5 mg Oral BID  . metoprolol succinate  25 mg Oral Daily  . pantoprazole  40 mg Oral BID  . sodium chloride flush  3 mL Intravenous Once  . sodium chloride flush  3 mL Intravenous Q12H  . spironolactone  25 mg Oral Daily   Continuous Infusions: . sodium chloride       LOS: 1 day    Time spent: 25 minutes spent in the coordination of care today.    Jonnie Finner, DO [facility]  If 7PM-7AM, please contact night-coverage [email or web address] 02/05/2020, 3:52 PM

## 2020-02-05 NOTE — ED Notes (Signed)
Pt resting in bed. Pt denies new or worsening complaints. Will continue to monitor. No distress noted. Pt on continuous monitoring via blood pressure, pulse ox, and cardiac monitor.  

## 2020-02-05 NOTE — ED Notes (Signed)
Tele   Breakfast ordered  

## 2020-02-05 NOTE — Progress Notes (Signed)
Echocardiogram 2D Echocardiogram has been performed.  Henry Ayala Tahjay Binion 02/05/2020, 10:46 AM

## 2020-02-05 NOTE — ED Notes (Signed)
Medications given and charted per MAR. Tolerated well.  

## 2020-02-05 NOTE — ED Notes (Signed)
Spoke with daughter and provided update. Daughter is also wanting to explore possible facility placement for pt.

## 2020-02-05 NOTE — ED Notes (Signed)
Attempted to call daughter to provide update. Called 2X. Got message stating call could not be completed at this time with number on file.

## 2020-02-05 NOTE — Consult Note (Addendum)
Cardiology Consultation:   Patient ID: Henry Ayala MRN: [id number]; DOB: 05/25/1931  Admit date: 02/04/2020 Date of Consult: 02/05/2020  Primary Care Provider: [name], DO Primary Cardiologist: Rozann Lesches, MD  Primary Electrophysiologist:  None    Patient Profile:   Henry Ayala is a 86 y.o. male with a history of CAD s/p PTCA of LAD in 1992 and BMS to RCA in [id number], chronic systolic CHF/ischemic cardiomyopathy with EF of 25-30% in 08/2017, chronic atrial fibrillation on Eliquis, sleep apnea, hypertension, hyperlipidemia, GERD, and BPH who is being seen today for the evaluation of acute on chronic CHF at the request of Dr. Marylyn Ishihara.  History of Present Illness:   Henry Ayala is a 86 year old male with the above history who is followed by Dr. Domenic Polite. He has a history of a remote MI back in 1992 which was treated with a PTCA to the LAD. He had another MI in 2004 which was treated with BMS to RCA at [facility]. Echo from 2015 showed EF of 48%. Repeat Echo in 08/2017 showed EF of 25-30% with diffuse hypokinesis and akinesis of the mid anterior and basal-mid anteroseptal myocardium concerning for ischemia. Myoview at this time revealed a large region of scar involving the anterior, anteroseptal, and inferior walls without any active ischemia. Plan was for medical therapy at that time. Patient was last seen by Dr. Domenic Polite in 09/2018 at which time he was doing well from a cardiac standpoint.  Patient presented to the ED yesterday for further evaluation of generalized weakness. Patient states he lives alone and is able to perform all activities of daily living independently. He reports progressive weakness over about the last 6 weeks but states this has not interfered with his ability to perform all of his normal activities. He also reports mild shortness of breath with activity but states this is not new and has not been getting worse. No shortness of breath at rest. No orthopnea  or PND. He states someone recently told him his legs were a little swollen. No chest pain. He occasionally notes increased heart rate with activity but states this resolves with rest. He is generally unaware of his atrial fibrillation. No lightheadedness, dizziness, or syncope. He denies any recent fevers or illnesses. No significant cough/nasal congestion. He reports very seldom abdominal pain, nausea, or vomiting. He does state that he has noticed some blood in his stool recently but has a difficult time elaborating on this.   In the ED, vitals stable. EKG showed rate controlled atrial fibrillation with IVCD and T wave inversions in lead aVL. High-sensitivity troponin minimally elevated and flat at 65 >> 64. BNP elevated at 756. Chest x-ray showed bibasilar atelectasis vs infiltrate and large hiatal hernia. D-dimer negative. WBC 7.2, Hgb 15.5, Plts 170. Na 141, K 4.0, Glucose 124, Bun 20, Cr 1.14. COVID negative x2.   At the time of this evaluation, patient resting comfortably. He states he feels about the same from when he came in - no significant difference after one dose of Lasix.  He reports remote tobacco used. It sounds like he tried smoking in his teens/twenties but never smoked consistently. He used to chew cigars but quit about 25-30 years ago. He does have a significant family history of CAD. His father died of a heart attack in his 60's and his brother died of a heart attack in his 58's.    Past Medical History:  Diagnosis Date  . Anteroseptal myocardial infarction ([place])  PTCA to LAD 1992  . BPH (benign prostatic hyperplasia)   . CAD (coronary artery disease)    PTCA to LAD 1992, BMS to RCA 2004  . CHF (congestive heart failure) ([place])   . Chronic atrial fibrillation ([place])   . Dyspnea   . Essential hypertension   . GERD (gastroesophageal reflux disease)   . GI bleed    March 2017 - esophagitis  . Hiatal hernia   . History of kidney stones   . Hyperlipidemia   . Hypokalemia   .  Hypomagnesemia   . Ischemic cardiomyopathy    [name] 48% 2015  . Leukocytosis 05/09/2016  . Myocardial infarction, inferior wall ([name])    BMS to proximal RCA 2004 at [facility]  . Sleep apnea    On CPAP    Past Surgical History:  Procedure Laterality Date  . CARDIOVERSION N/A 06/30/2014   Procedure: CARDIOVERSION;  Surgeon: Sanda Klein, MD;  Location: [facility] ENDOSCOPY;  Service: Cardiovascular;  Laterality: N/A;  . ESOPHAGOGASTRODUODENOSCOPY Left 02/14/2016   Procedure: ESOPHAGOGASTRODUODENOSCOPY (EGD);  Surgeon: Wonda Horner, MD;  Location: [facility];  Service: Endoscopy;  Laterality: Left;  . EYE SURGERY Bilateral   . TONSILLECTOMY    . TOTAL KNEE ARTHROPLASTY     right     Home Medications:  Prior to Admission medications   Medication Sig Start Date End Date Taking? Authorizing Provider  acetaminophen (TYLENOL) 500 MG tablet Take 500 mg by mouth every 6 (six) hours as needed for mild pain or headache.   Yes [provider]  alum & mag hydroxide-simeth (MAALOX/MYLANTA) 200-200-20 MG/5ML suspension Take 30 mLs by mouth every 6 (six) hours as needed for indigestion or heartburn. 05/12/16  Yes Dhungel, Nishant, MD  apixaban (ELIQUIS) 5 MG TABS tablet Take 1 tablet (5 mg total) by mouth 2 (two) times daily. 09/15/19  Yes Gottschalk, Leatrice Jewels M, DO  bismuth subsalicylate (PEPTO BISMOL) 262 MG/15ML suspension Take 30 mLs by mouth every 6 (six) hours as needed for diarrhea or loose stools.   Yes [provider]  doxazosin (CARDURA) 2 MG tablet Take 1 tablet (2 mg total) by mouth daily. 09/15/19  Yes Gottschalk, Leatrice Jewels M, DO  lovastatin (MEVACOR) 40 MG tablet Take 1 tablet (40 mg total) by mouth daily. 09/15/19  Yes Ronnie Doss M, DO  metoprolol succinate (TOPROL-XL) 25 MG 24 hr tablet Take 1 tablet (25 mg total) by mouth daily. 01/11/20  Yes Ronnie Doss M, DO  Multiple Vitamins-Minerals (MULTIVITAMIN WITH MINERALS) tablet Take 1 tablet by mouth daily.   Yes [provider]  pantoprazole (PROTONIX) 40 MG tablet Take 1 tablet (40 mg total) by mouth 2 (two) times daily. 05/26/19  Yes Ronnie Doss M, DO  spironolactone (ALDACTONE) 25 MG tablet Take 1 tablet (25 mg total) by mouth daily. 09/15/19  Yes Gottschalk, Leatrice Jewels M, DO  mirtazapine (REMERON) 15 MG tablet Take 1 tablet (15 mg total) by mouth at bedtime. 01/28/20   Claretta Fraise, MD    Inpatient Medications: Scheduled Meds: . apixaban  5 mg Oral BID  . furosemide  40 mg Intravenous Daily  . metoprolol succinate  25 mg Oral Daily  . pantoprazole  40 mg Oral BID  . sodium chloride flush  3 mL Intravenous Once  . sodium chloride flush  3 mL Intravenous Q12H  . spironolactone  25 mg Oral Daily   Continuous Infusions: . sodium chloride     PRN Meds: sodium chloride, acetaminophen, ondansetron (ZOFRAN) IV, sodium chloride flush  Allergies:    Allergies  Allergen Reactions  . Iodinated Diagnostic Agents     unknown  . Lipitor [Atorvastatin Calcium]     unknown  . Sulfa Drugs Cross Reactors     unknown    Social History:   Social History   Socioeconomic History  . Marital status: Widowed    Spouse name: Not on file  . Number of children: 2  . Years of education: Not on file  . Highest education level: Not on file  Occupational History  . Occupation: retired    Comment: 1999- [employer]  Tobacco Use  . Smoking status: Never Smoker  . Smokeless tobacco: Never Used  Substance and Sexual Activity  . Alcohol use: Yes    Alcohol/week: 0.0 standard drinks    Comment: occasionally drink  . Drug use: No  . Sexual activity: Not on file  Other Topics Concern  . Not on file  Social History Narrative   Lives alone ( 2 children - not local)    Social Determinants of Health   Financial Resource Strain:   . Difficulty of Paying Living Expenses:   Food Insecurity:   . Worried About Charity fundraiser in the Last Year:   . Arboriculturist in the Last Year:   Transportation Needs:    . Film/video editor (Medical):   [name] Lack of Transportation (Non-Medical):   Physical Activity:   . Days of Exercise per Week:   . Minutes of Exercise per Session:   Stress:   . Feeling of Stress :   Social Connections:   . Frequency of Communication with Friends and Family:   . Frequency of Social Gatherings with Friends and Family:   . Attends Religious Services:   . Active Member of Clubs or Organizations:   . Attends Archivist Meetings:   [name] Marital Status:   Intimate Partner Violence:   . Fear of Current or Ex-Partner:   . Emotionally Abused:   [name] Physically Abused:   . Sexually Abused:     Family History:    Family History  Problem Relation Age of Onset  . Heart attack Father   . Stroke Mother   . Cancer Mother        colon  . Heart attack Brother 68  . Cancer Brother        prostate  . Cancer Sister        colon  . Parkinson's disease Sister   . Stroke Maternal Grandmother   . Cancer Daughter        unknown origin   . GI problems Son      ROS:  Please see the history of present illness.  All other ROS reviewed and negative.     Physical Exam/Data:   Vitals:   02/05/20 1024 02/05/20 1354 02/05/20 1601 02/05/20 1602  BP: 103/71 (!) 154/91 (!) 118/98   Pulse: 88  87   Resp: (!) 24 19  19   Temp:  98.2 F (36.8 C) 98.5 F (36.9 C)   TempSrc:  Oral Oral   SpO2: 93% 92% 93%   Weight:  67 kg    Height:  5\' 7"  (1.702 m)      Intake/Output Summary (Last 24 hours) at 02/05/2020 1603 Last data filed at 02/05/2020 0736 Gross per 24 hour  Intake --  Output 2000 ml  Net -2000 ml   Last 3 Weights 02/05/2020 02/04/2020 01/28/2020  Weight (lbs) 147 lb 9.6 oz  160 lb 156 lb 6.4 oz  Weight (kg) 66.951 kg 72.576 kg 70.943 kg     Body mass index is 23.12 kg/m.  General: 86 y.o. Caucasian male resting comfortably in no acute distress. HEENT: Normocephalic and atraumatic.  Neck: Supple. No carotid bruits. No JVD. Heart: Irregularly irregular rhythm  with norma rate. Distinct S1 and S2. Possible soft systolic murmur noted. No gallops or rubs. Radial and distal pedal pulses 2+ and equal bilaterally. Lungs: No increased work of breathing. Possible faint crackles in bilateral bases (possible atelectasis) but lungs mostly clear to auscultation.  Abdomen: Soft, non-distended, and non-tender to palpation. Bowel sounds present. Extremities: No lower extremity edema.    Skin: Warm and dry. Neuro: Alert to person, date of birth, place (hospital, city), year, and president but thought month was October or November. No focal deficits. Psych: Normal affect. Responds appropriately.   EKG:  The EKG was personally reviewed and demonstrates:  Atrial fibrillation, rate in the 88 bpm, with IVCD and T wave inversions in lead aVL.   Telemetry:  Telemetry was personally reviewed and demonstrates: Atrial fibrillation with rates in the 70's to 90's.   Relevant CV Studies:  Echocardiogram 02/05/2020: Impressions: 1. Left ventricular ejection fraction, by estimation, is 15-20%. The left  ventricle has severely decreased function. The left ventricle demonstrates  global hypokinesis. The left ventricular internal cavity size was severely  dilated. Left ventricular  diastolic function could not be evaluated.  2. Right ventricular systolic function is low normal. The right  ventricular size is normal. There is mildly elevated pulmonary artery  systolic pressure. The estimated right ventricular systolic pressure is  [id number] mmHg.  3. Left atrial size was severely dilated.  4. The mitral valve is grossly normal. Mild to moderate mitral valve  regurgitation.  5. The aortic valve is tricuspid. Aortic valve regurgitation is mild. No  aortic stenosis is present.  6. There is mild dilatation of the ascending aorta measuring 41 mm.  7. The inferior vena cava is normal in size with <50% respiratory  variability, suggesting right atrial pressure of 8 mmHg.    Comparison(s): A prior study was performed on 08/15/2017. Prior images  unable to be directly viewed, comparison made by report only. Changes from  prior study are noted. EF now 15-20%.   Laboratory Data:  High Sensitivity Troponin:   Recent Labs  Lab 02/04/20 2136 02/04/20 2335  TROPONINIHS 65* 64*     Chemistry Recent Labs  Lab 02/04/20 1804  NA 141  K 4.0  CL 102  CO2 28  GLUCOSE 124*  BUN 20  CREATININE 1.14  CALCIUM 9.1  GFRNONAA 58*  GFRAA >60  ANIONGAP 11    No results for input(s): PROT, ALBUMIN, AST, ALT, ALKPHOS, BILITOT in the last 168 hours. Hematology Recent Labs  Lab 02/04/20 1804  WBC 7.2  RBC 4.69  HGB 15.5  HCT 47.6  MCV 101.5*  MCH 33.0  MCHC 32.6  RDW 13.5  PLT 170   BNP Recent Labs  Lab 02/04/20 2050  BNP 756.6*    DDimer  Recent Labs  Lab 02/05/20 0030  DDIMER 0.35     Radiology/Studies:  CT Head Wo Contrast  Result Date: 02/04/2020 CLINICAL DATA:  Generalized weakness. EXAM: CT HEAD WITHOUT CONTRAST TECHNIQUE: Contiguous axial images were obtained from the base of the skull through the vertex without intravenous contrast. COMPARISON:  February 12, 2016 FINDINGS: Brain: There is mild cerebral atrophy with widening of the extra-axial spaces and ventricular  dilatation. There are areas of decreased attenuation within the white matter tracts of the supratentorial brain, consistent with microvascular disease changes. Vascular: No hyperdense vessel or unexpected calcification. Skull: Normal. Negative for fracture or focal lesion. Sinuses/Orbits: No acute finding. Other: None. IMPRESSION: 1. Generalized cerebral atrophy. 2. No acute intracranial abnormality. Electronically Signed   By: Virgina Norfolk M.D.   On: 02/04/2020 23:23   DG Chest Portable 1 View  Result Date: 02/04/2020 CLINICAL DATA:  86 year old male with shortness of breath EXAM: PORTABLE CHEST 1 VIEW COMPARISON:  Chest radiograph dated 01/28/2020. FINDINGS: Bilateral lower  lung field streaky densities, likely atelectasis. Pneumonia is not excluded. Clinical correlation is recommended. No large pleural effusion or pneumothorax. Stable cardiomegaly. Atherosclerotic calcification of the aorta. Large hiatal hernia containing distended bowel as seen on the prior radiograph. Associated obstruction is not excluded on this radiograph. IMPRESSION: 1. Bibasilar atelectasis versus infiltrate. 2. Large hiatal hernia containing distended bowel similar to prior radiograph. Electronically Signed   By: Anner Crete M.D.   On: 02/04/2020 20:43   ECHOCARDIOGRAM COMPLETE  Result Date: 02/05/2020    ECHOCARDIOGRAM REPORT   Patient Name:   Henry Ayala Date of Exam: 02/05/2020 Medical Rec #:  [id number]        Height:       67.0 in Accession #:    [id number]       Weight:       160.0 lb Date of Birth:  Aug 18, 1931        BSA:          1.839 m Patient Age:    51 years         BP:           103/71 mmHg Patient Gender: M                HR:           80 bpm. Exam Location:  Inpatient Procedure: 2D Echo, 3D Echo, Color Doppler, Cardiac Doppler and Strain Analysis Indications:    [id number] Acute systolic (congestive) heart failure  History:        Patient has prior history of Echocardiogram examinations, most                 recent 08/15/2017. CHF, CAD, Arrythmias:Atrial Fibrillation; Risk                 Factors:Dyslipidemia, Hypertension and Sleep Apnea.  Sonographer:    Raquel Sarna Senior RDCS Referring Phys: [id number] [place]  1. Left ventricular ejection fraction, by estimation, is 15-20%. The left ventricle has severely decreased function. The left ventricle demonstrates global hypokinesis. The left ventricular internal cavity size was severely dilated. Left ventricular diastolic function could not be evaluated.  2. Right ventricular systolic function is low normal. The right ventricular size is normal. There is mildly elevated pulmonary artery systolic pressure. The estimated right  ventricular systolic pressure is [id number] mmHg.  3. Left atrial size was severely dilated.  4. The mitral valve is grossly normal. Mild to moderate mitral valve regurgitation.  5. The aortic valve is tricuspid. Aortic valve regurgitation is mild. No aortic stenosis is present.  6. There is mild dilatation of the ascending aorta measuring 41 mm.  7. The inferior vena cava is normal in size with <50% respiratory variability, suggesting right atrial pressure of 8 mmHg. Comparison(s): A prior study was performed on 08/15/2017. Prior images unable to be directly viewed, comparison made by report only. Changes from  prior study are noted. EF now 15-20%. FINDINGS  Left Ventricle: Left ventricular ejection fraction, by estimation, is 15-20%. The left ventricle has severely decreased function. The left ventricle demonstrates global hypokinesis. The left ventricular internal cavity size was severely dilated. There is no left ventricular hypertrophy. Abnormal (paradoxical) septal motion, consistent with left bundle branch block. Left ventricular diastolic function could not be evaluated due to atrial fibrillation. Left ventricular diastolic function could not be evaluated. Right Ventricle: The right ventricular size is normal. No increase in right ventricular wall thickness. Right ventricular systolic function is low normal. There is mildly elevated pulmonary artery systolic pressure. The tricuspid regurgitant velocity is 2.88 m/s, and with an assumed right atrial pressure of 8 mmHg, the estimated right ventricular systolic pressure is [id number] mmHg. Left Atrium: Left atrial size was severely dilated. Right Atrium: Right atrial size was normal in size. Pericardium: Trivial pericardial effusion is present. Presence of pericardial fat pad. Mitral Valve: The mitral valve is grossly normal. Mild to moderate mitral valve regurgitation. Tricuspid Valve: The tricuspid valve is grossly normal. Tricuspid valve regurgitation is mild . No evidence  of tricuspid stenosis. Aortic Valve: The aortic valve is tricuspid. Aortic valve regurgitation is mild. Aortic regurgitation PHT measures 1089 msec. No aortic stenosis is present. Pulmonic Valve: The pulmonic valve was grossly normal. Pulmonic valve regurgitation is trivial. No evidence of pulmonic stenosis. Aorta: The aortic root is normal in size and structure. There is mild dilatation of the ascending aorta measuring 41 mm. Venous: The inferior vena cava is normal in size with less than 50% respiratory variability, suggesting right atrial pressure of 8 mmHg. IAS/Shunts: No atrial level shunt detected by color flow Doppler.  LEFT VENTRICLE PLAX 2D LVIDd:         7.00 cm LVIDs:         6.40 cm LV PW:         0.80 cm LV IVS:        0.90 cm      3D Volume EF LVOT diam:     2.10 cm      LV 3D EF:    19.00 % LV SV:         35 LV SV Index:   19           3D Volume EF: LVOT Area:     3.46 cm     3D EF:        19 %  LV Volumes (MOD) LV vol d, MOD A2C: 230.0 ml LV vol d, MOD A4C: 192.0 ml LV vol s, MOD A2C: 180.0 ml LV vol s, MOD A4C: 154.0 ml LV SV MOD A2C:     50.0 ml LV SV MOD A4C:     192.0 ml LV SV MOD BP:      44.2 ml RIGHT VENTRICLE RV S prime:     10.60 cm/s TAPSE (M-mode): 1.4 cm LEFT ATRIUM              Index       RIGHT ATRIUM           Index LA diam:        5.20 cm  2.83 cm/m  RA Area:     20.90 cm LA Vol (A2C):   130.0 ml 70.68 ml/m RA Volume:   63.00 ml  34.25 ml/m LA Vol (A4C):   102.0 ml 55.46 ml/m LA Biplane Vol: 117.0 ml 63.61 ml/m  AORTIC VALVE LVOT Vmax:   71.80 cm/s  LVOT Vmean:  44.433 cm/s LVOT VTI:    0.101 m AI PHT:      1089 msec  AORTA Ao Root diam: 3.10 cm Ao Asc diam:  4.10 cm TRICUSPID VALVE TR Peak grad:   33.2 mmHg TR Vmax:        288.00 cm/s  SHUNTS Systemic VTI:  0.10 m Systemic Diam: 2.10 cm Eleonore Chiquito MD Electronically signed by Eleonore Chiquito MD Signature Date/Time: 02/05/2020/1:25:06 PM    Final     Assessment and Plan:   Acute on Chronic Systolic CHF/Ischemic  Cardiomyopathy - Patient presented with generalized weakness and mild shortness of breath with activity. - BNP elevated at 756.  - Chest x-ray showed bibasilar atelectasis vs infiltrate  - Echo this admission showed [name] of 15-20% with global hypokinesis. EF down from 25-30% in 2018.  - Received one dose of IV Lasix 40mg  yesterday. Net negative 2 L so far. Renal function stable. - Does not appear significantly volume overloaded on exam. However, OK to give another dose of IV Lasix tonight to see how he responds.  - Continue home Toprol-XL 25mg  daily and Spironolactone 25mg  daily.  - He is not on an ACEi/ABR/ARNI at home. Consider adding Losartan 25mg  daily tomorrow if renal function stable with additional diuresis.  - Continue to monitor daily weights, strict I/O's, and renal function. - Explained that worsening EF could be due to progressive CAD. However, patient prefers to treat medically. Given advanced age and progressive weakness, I think this is very reasonable.   Elevated Troponin with Known CAD - History of PTCA to LAD in 1992 and BMS to RCA in 2004. Lexiscan in 2018 showed a large region of scar involving the anterior, anteroseptal, and inferior walls without any active ischemia. Medical therapy recommended at that time.  - Elevated troponin minimally elevated and flat at 65 >> 64. Not consistent with ACS. - No angina but does not some mild dyspnea on exertion. - Continue beta-blocker and statin.  - Suspect demand ischemia in setting of possible acute on chronic CHF. Patient prefers to treat medically so would not pursue any invasive ischemic work-up at this time.   Chronic Atrial Fibrillation - Rate controlled.  - Continue Toprol-XL 25mg  daily. - Continue chronic anti-coagulation with Eliquis 5mg  twice daily.   Hypertension - BP mildly elevated at times.  - Continue Toprol and Spironolactone as above.   Hyperlipidemia - LDL 66 in 12/2018. - Will recheck fasting lipid panel in  the morning.  - Currently on Lovastatin 40mg  daily at home. Has Atorvastatin listed as an allergy but patient does not remember taking this. Will start Pravastatin for now since Lovastatin is not on formulary.   Possible Bloody Stools - Patient report recent blood in stool but had a hard time elaborating on this.  - Hemoglobin normal at 15.5. - Continue to monitor for signs of bleeding given he is on Eliquis.  - Can follow-up with PCP as outpatient.   Progressive Weakness - Will check TSH, Vitamin B12, and Vitamin D. - Primary team consulted PT/OT.     For questions or updates, please contact  Please consult [email or web address] for contact info under     Signed, Darreld Mclean, PA-C  02/05/2020 4:03 PM  Patient seen and examined with C. Drucilla Schmidt.  Agree as above, with the following exceptions and changes as noted below. Patient is chest pain free and overall not significantly sob. He is fidgeting and picking at his hospital gown. Gen: NAD,  CV: iRRR, no murmurs, Lungs: clear, Abd: soft, Extrem: Warm, well perfused, no edema, Neuro/Psych: alert and oriented x 3, normal mood and affect. All available labs, radiology testing, previous records reviewed. Continue IV diuresis. Patient defers on further invasive testing, however I question if there is an element of dementia with picking at his clothes and telling me his cardiologist (Dr. Domenic Polite) moved to [place]. Consider losartan if BP allows. Consider CRT if patient is a candidate.  Elouise Munroe 02/05/20 11:32 PM

## 2020-02-06 DIAGNOSIS — I5023 Acute on chronic systolic (congestive) heart failure: Secondary | ICD-10-CM

## 2020-02-06 LAB — RENAL FUNCTION PANEL
Albumin: 3.4 g/dL — ABNORMAL LOW (ref 3.5–5.0)
Anion gap: 11 (ref 5–15)
BUN: 22 mg/dL (ref 8–23)
CO2: 32 mmol/L (ref 22–32)
Calcium: 9.4 mg/dL (ref 8.9–10.3)
Chloride: 99 mmol/L (ref 98–111)
Creatinine, Ser: 1.25 mg/dL — ABNORMAL HIGH (ref 0.61–1.24)
GFR calc Af Amer: >60 mL/min (ref >60)
GFR calc non Af Amer: 52 mL/min — ABNORMAL LOW (ref >60)
Glucose, Bld: 116 mg/dL — ABNORMAL HIGH (ref 70–99)
Phosphorus: 3.7 mg/dL (ref 2.5–4.6)
Potassium: 4.1 mmol/L (ref 3.5–5.1)
Sodium: 142 mmol/L (ref 135–145)

## 2020-02-06 LAB — LIPID PANEL
Cholesterol: 115 mg/dL (ref 0–200)
HDL: 38 mg/dL — ABNORMAL LOW (ref >40)
LDL Cholesterol: 63 mg/dL (ref 0–99)
Total CHOL/HDL Ratio: 3 RATIO
Triglycerides: 71 mg/dL (ref <150)
VLDL: 14 mg/dL (ref 0–40)

## 2020-02-06 LAB — CBC WITH DIFFERENTIAL/PLATELET
Abs Immature Granulocytes: 0.03 10*3/uL (ref 0.00–0.07)
Basophils Absolute: 0 10*3/uL (ref 0.0–0.1)
Basophils Relative: 0 %
Eosinophils Absolute: 0.1 10*3/uL (ref 0.0–0.5)
Eosinophils Relative: 2 %
HCT: 50 % (ref 39.0–52.0)
Hemoglobin: 16.5 g/dL (ref 13.0–17.0)
Immature Granulocytes: 0 %
Lymphocytes Relative: 16 %
Lymphs Abs: 1.3 10*3/uL (ref 0.7–4.0)
MCH: 33.5 pg (ref 26.0–34.0)
MCHC: 33 g/dL (ref 30.0–36.0)
MCV: 101.4 fL — ABNORMAL HIGH (ref 80.0–100.0)
Monocytes Absolute: 0.6 10*3/uL (ref 0.1–1.0)
Monocytes Relative: 7 %
Neutro Abs: 6.1 10*3/uL (ref 1.7–7.7)
Neutrophils Relative %: 75 %
Platelets: 180 10*3/uL (ref 150–400)
RBC: 4.93 MIL/uL (ref 4.22–5.81)
RDW: 13.8 % (ref 11.5–15.5)
WBC: 8.2 10*3/uL (ref 4.0–10.5)
nRBC: 0 % (ref 0.0–0.2)

## 2020-02-06 LAB — MAGNESIUM: Magnesium: 1.8 mg/dL (ref 1.7–2.4)

## 2020-02-06 LAB — TSH: TSH: 1.146 u[IU]/mL (ref 0.350–4.500)

## 2020-02-06 LAB — VITAMIN B12: Vitamin B-12: 472 pg/mL (ref 180–914)

## 2020-02-06 LAB — VITAMIN D 25 HYDROXY (VIT D DEFICIENCY, FRACTURES): Vit D, 25-Hydroxy: 32.01 ng/mL (ref 30–100)

## 2020-02-06 MED ORDER — HALOPERIDOL LACTATE 5 MG/ML IJ SOLN
2.5000 mg | Freq: Once | INTRAMUSCULAR | Status: AC | PRN
  Administered 2020-02-06: 2.5 mg via INTRAVENOUS
  Filled 2020-02-06: qty 1

## 2020-02-06 NOTE — Progress Notes (Signed)
Pt had ambulated with a rolling walker. Had ambulated well with no noted c/o SOB, dizziness  or weakness.

## 2020-02-06 NOTE — Evaluation (Signed)
Physical Therapy Evaluation Patient Details Name: Henry Ayala MRN: [id number] DOB: 1931/01/20 Today's Date: 02/06/2020   History of Present Illness  86 y.o. male with medical history significant for atrial fibrillation on Eliquis, coronary artery disease, chronic systolic CHF, and large hiatal hernia, now presenting to the emergency department for evaluation of exertional dyspnea and right lower leg swelling.   Clinical Impression   Pt admitted with above diagnosis. PTA was living home alone but had part time caregiver, of late cognition and shown decline and pt has seemed to be more confused and forgetful. Pt currently with functional limitations due to the deficits listed below (see PT Problem List). Saw pt this pm at 1207 was found in bed attempting to sit up stating he was just trying to breathe, he was agreeable to assessment. During this assessment pt did not do very well needing min-mod a with all mobility, he was minimally able to ambulate in room 67ft with RW but needed mod a to propagate walker and also navigate around room. Pt was left sitting in recliner with daughter in room. Therapist returned approx 1 hr later and pt reported ready to return to bed, agreeable to a little ambulation as well. Pt ambulated same 40ft with RW but this time needed min a, balance and coordination was much improved and so was ability to navigate in room and propel walker. Noted though pt was still quite confused needing increased cues and instructions to complete some simple tasks. Pt will benefit from skilled PT to increase their independence and safety with mobility to allow discharge to the venue listed below.       Follow Up Recommendations SNF    Equipment Recommendations  None recommended by PT    Recommendations for Other Services       Precautions / Restrictions Precautions Precautions: Fall Restrictions Weight Bearing Restrictions: No      Mobility  Bed Mobility Overal bed mobility:  Independent Bed Mobility: Sit to Supine     Supine to sit: Supervision     General bed mobility comments: no assist  Transfers Overall transfer level: Needs assistance Equipment used: 1 person hand held assist Transfers: Sit to/from Stand Sit to Stand: Min guard Stand pivot transfers: Min assist;Mod assist       General transfer comment: min guard for safety, pt reaching for furniture as he walked within room  Ambulation/Gait Ambulation/Gait assistance: Min assist Gait Distance (Feet): 44 Feet Assistive device: Rolling walker (2 wheeled) Gait Pattern/deviations: Wide base of support;Shuffle Gait velocity: dec   General Gait Details: much better balance and coordination this time, did not need assist with walker management and was not running into objects in pathway  Stairs            Wheelchair Mobility    Modified Rankin (Stroke Patients Only)       Balance Overall balance assessment: Needs assistance Sitting-balance support: Feet supported Sitting balance-Leahy Scale: Good     Standing balance support: During functional activity;Bilateral upper extremity supported Standing balance-Leahy Scale: Fair                               Pertinent Vitals/Pain Pain Assessment: No/denies pain Faces Pain Scale: No hurt Pain Intervention(s): Limited activity within patient's tolerance    Home Living Family/patient expects to be discharged to:: Skilled nursing facility Living Arrangements: Alone Available Help at Discharge: Personal care attendant(part time)  Prior Function Level of Independence: Needs assistance   Gait / Transfers Assistance Needed: walks without a device  ADL's / Homemaking Assistance Needed: Henry Ayala his caregiver helps with medications, cooking, cleaning, pt reports independence in self care and driving        Hand Dominance   Dominant Hand: Right    Extremity/Trunk Assessment   Upper Extremity  Assessment Upper Extremity Assessment: Overall WFL for tasks assessed    Lower Extremity Assessment Lower Extremity Assessment: Defer to PT evaluation    Cervical / Trunk Assessment Cervical / Trunk Assessment: Normal  Communication   Communication: HOH  Cognition Arousal/Alertness: Awake/alert Behavior During Therapy: Restless Overall Cognitive Status: Impaired/Different from baseline Area of Impairment: Memory;Safety/judgement;Problem solving                     Memory: Decreased short-term memory;Decreased recall of precautions   Safety/Judgement: Decreased awareness of safety;Decreased awareness of deficits   Problem Solving: Slow processing;Decreased initiation        General Comments      Exercises     Assessment/Plan    PT Assessment Patient needs continued PT services  PT Problem List Decreased strength;Decreased activity tolerance;Decreased balance;Decreased mobility;Decreased coordination;Decreased knowledge of use of DME;Decreased safety awareness       PT Treatment Interventions Gait training;DME instruction;Therapeutic activities;Functional mobility training;Therapeutic exercise;Neuromuscular re-education;Balance training;Patient/family education    PT Goals (Current goals can be found in the Care Plan section)  Acute Rehab PT Goals Patient Stated Goal: daughter wants ST rehab in SNF PT Goal Formulation: With patient Time For Goal Achievement: 02/20/20 Potential to Achieve Goals: Fair    Frequency Min 2X/week   Barriers to discharge Other (comment) increasing confusion at home and living alone    Co-evaluation               AM-PAC PT "6 Clicks" Mobility  Outcome Measure Help needed turning from your back to your side while in a flat bed without using bedrails?: None Help needed moving from lying on your back to sitting on the side of a flat bed without using bedrails?: A Little Help needed moving to and from a bed to a chair  (including a wheelchair)?: A Little Help needed standing up from a chair using your arms (e.g., wheelchair or bedside chair)?: A Little Help needed to walk in hospital room?: A Little Help needed climbing 3-5 steps with a railing? : A Lot 6 Click Score: 18    End of Session Equipment Utilized During Treatment: Gait belt Activity Tolerance: Patient tolerated treatment well Patient left: in bed;with call bell/phone within reach;with bed alarm set;with family/visitor present Nurse Communication: Mobility status PT Visit Diagnosis: Other abnormalities of gait and mobility (R26.89);Unsteadiness on feet (R26.81)    Time: [id number] PT Time Calculation (min) (ACUTE ONLY): 11 min   Charges:   PT Evaluation $PT Eval Moderate Complexity: 1 Mod PT Treatments $Therapeutic Activity: 8-22 mins        Horald Chestnut, PT   Delford Field 02/06/2020, 3:58 PM

## 2020-02-06 NOTE — Progress Notes (Signed)
[name]  PROGRESS NOTE    Henry Ayala  [id number] DOB: 01/12/1931 DOA: 02/04/2020 PCP: [name], DO   Brief Narrative:   Henry Ayala is a 86 y.o. male with medical history significant for atrial fibrillation on Eliquis, coronary artery disease, chronic systolic CHF, and large hiatal hernia, now presenting to the emergency department for evaluation of exertional dyspnea and right lower leg swelling.  Patient has a part-time caregiver who assists with the history and reports that the patient has been appearing dyspneic with minimal exertion for the past couple days and she also noted swelling involving the lower right leg yesterday.  Patient himself has not been complaining of anything.  His caregiver reports that he has been developing dementia and has had more episodes of confusion recently.  There was some concern that recent addition of Remeron might be contributing to his worsening confusion and this was stopped.  His caregiver administers his medications once a day and then sets out his twice daily medications for the patient to take on his own, but she notes that he sometimes forgets to.  3/26: Spoke with dtr about symptoms and echo findings. His respiratory status is improving on lasix and we will continue that. Echo shows worsening of EF. Will ask cards to weigh in. Dtr also concerned about his weakness and mobility at home. Will have PT/OT eval.   3/27: Spoke to daughter again today. Major question today remains inpatient rehab versus home with home health once discharged. Otherwise patient with continued ongoing SOB although he feels improved overall.  Assessment & Plan:   Principal Problem:   Acute on chronic systolic CHF (congestive heart failure) ([name]) Active Problems:   Chronic atrial fibrillation ([name])   CAD (coronary artery disease)   Diaphragmatic hernia without obstruction and without gangrene   SOB (shortness of breath)  Exertional dyspnea Acute  exacerbation of HFrEF      - Presents with a a couple days of exertional dyspnea and caretaker reports that his right lower leg had been swollen yesterday      - trp flat at 65, 64     - BNP 756     - lasix 40mg  IV given in ED; continue daily, follow labs     - d-dimer negative     - procal negative     - Echo with worsening HF; now [name] 15-20%; in 2018 it was 25-30%     - have consulted cards; appreciate assistance     - follow I&Os, dailt wts, continue aldactone     - Would benefit from RAASi however BP has been fluctuant along with mild increase in creatinine as well. Likely need to stabilize fluid status then add low dose ACEi.     - fluid restriction to 1800cc   Atrial fibrillation      - CHADS-VASC at least 56 (age x2, CAD, CHF)      - Continue Eliquis, Toprol    Hiatal hernia      - Appears stable on CXR      - Continue PPI    CAD; elevated troponin      - Denies chest pain but presents with DOE and found to have HS troponin of 65      - Continue cardiac monitoring, trend troponin, echo as above     - 3/26: see above  Generalized weakness Debility     - PT/OT eval   DVT prophylaxis: eliquis Code Status: FULL Family Communication: Spoke  with dtr at bedside. Concerned about discharge planning Disposition Plan: Remains inpt for IV diuresis, PT/OT eval, ongoing w/u. Likely inpatient rehab, pending PT/OT eval  Consultants:   Cardiology  ROS:  Reports some dyspnea, weakness. Remainder 10-pt ROS is negative for all not previously mentioned.  Subjective: Patient reports feeling somewhat improved from energy standpoint however he continues to complain of SOB on minimal exertion. BP low this morning and he admits to some lightheadedness early that has since resolved. Denies chest pain.  Objective: Vitals:   02/05/20 1602 02/05/20 2029 02/06/20 0512 02/06/20 0902  BP:  117/79 (!) 81/68 126/86  Pulse:  90 85 90  Resp: 19 20 19    Temp:  97.7 F (36.5 C) 97.7 F (36.5  C)   TempSrc:  Oral Oral   SpO2:  94% 97%   Weight:   68.4 kg   Height:       No intake or output data in the 24 hours ending 02/06/20 1336 Filed Weights   02/04/20 1736 02/05/20 1354 02/06/20 0512  Weight: 72.6 kg 67 kg 68.4 kg    Examination:  General: 85 y.o. male resting in bed in NAD Cardiovascular:irregular,  +S1, S2, no m/g/r Respiratory: CTABL, no w/r/r, breathlessness w/ speech GI: BS+, NDNT, no masses noted, no organomegaly noted MSK: No e/c/c Neuro: Alert to name, follows commands Psyc: Appropriate interaction and affect, calm/cooperative   Data Reviewed: I have personally reviewed following labs and imaging studies.  CBC: Recent Labs  Lab 02/04/20 1804 02/06/20 0441  WBC 7.2 8.2  NEUTROABS  --  6.1  HGB 15.5 16.5  HCT 47.6 50.0  MCV 101.5* 101.4*  PLT 170 [id number]   Basic Metabolic Panel: Recent Labs  Lab 02/04/20 1804 02/05/20 1609 02/06/20 0441  NA 141 140 142  K 4.0 4.0 4.1  CL 102 101 99  CO2 28 29 32  GLUCOSE 124* 144* 116*  BUN 20 22 22   CREATININE 1.14 1.21 1.25*  CALCIUM 9.1 9.2 9.4  MG  --   --  1.8  PHOS  --  3.8 3.7   GFR: Estimated Creatinine Clearance: 39.7 mL/min (A) (by C-G formula based on SCr of 1.25 mg/dL (H)). Liver Function Tests: Recent Labs  Lab 02/05/20 1609 02/06/20 0441  ALBUMIN 3.3* 3.4*   No results for input(s): LIPASE, AMYLASE in the last 168 hours. No results for input(s): AMMONIA in the last 168 hours. Coagulation Profile: No results for input(s): INR, PROTIME in the last 168 hours. Cardiac Enzymes: No results for input(s): CKTOTAL, CKMB, CKMBINDEX, TROPONINI in the last 168 hours. BNP (last 3 results) No results for input(s): PROBNP in the last 8760 hours. HbA1C: No results for input(s): HGBA1C in the last 72 hours. CBG: No results for input(s): GLUCAP in the last 168 hours. Lipid Profile: Recent Labs    02/06/20 0441  CHOL 115  HDL 38*  LDLCALC 63  TRIG 71  CHOLHDL 3.0   Thyroid Function  Tests: Recent Labs    02/06/20 0441  TSH 1.146   Anemia Panel: Recent Labs    02/06/20 0441  VITAMINB12 472   Sepsis Labs: Recent Labs  Lab 02/04/20 2335  PROCALCITON <0.10    Recent Results (from the past 240 hour(s))  SARS CORONAVIRUS 2 (TAT 6-24 HRS) Nasopharyngeal Nasopharyngeal Swab     Status: None   Collection Time: 02/05/20 12:42 AM   Specimen: Nasopharyngeal Swab  Result Value Ref Range Status   SARS Coronavirus 2 NEGATIVE NEGATIVE Final  Comment: (NOTE) SARS-CoV-2 target nucleic acids are NOT DETECTED. The SARS-CoV-2 RNA is generally detectable in upper and lower respiratory specimens during the acute phase of infection. Negative results do not preclude SARS-CoV-2 infection, do not rule out co-infections with other pathogens, and should not be used as the sole basis for treatment or other patient management decisions. Negative results must be combined with clinical observations, patient history, and epidemiological information. The expected result is Negative. Fact Sheet for Patients: SugarRoll.be Fact Sheet for Healthcare Providers: https://www.woods-mathews.com/ This test is not yet approved or cleared by the [place] FDA and  has been authorized for detection and/or diagnosis of SARS-CoV-2 by FDA under an Emergency Use Authorization (EUA). This EUA will remain  in effect (meaning this test can be used) for the duration of the COVID-19 declaration under Section 56 4(b)(1) of the Act, 21 U.S.C. section 360bbb-3(b)(1), unless the authorization is terminated or revoked sooner. Performed at [facility], [place] [address] [place], [place] [address]       Radiology Studies: CT Head Wo Contrast  Result Date: 02/04/2020 CLINICAL DATA:  Generalized weakness. EXAM: CT HEAD WITHOUT CONTRAST TECHNIQUE: Contiguous axial images were obtained from the base of the skull through the vertex without intravenous  contrast. COMPARISON:  February 12, 2016 FINDINGS: Brain: There is mild cerebral atrophy with widening of the extra-axial spaces and ventricular dilatation. There are areas of decreased attenuation within the white matter tracts of the supratentorial brain, consistent with microvascular disease changes. Vascular: No hyperdense vessel or unexpected calcification. Skull: Normal. Negative for fracture or focal lesion. Sinuses/Orbits: No acute finding. Other: None. IMPRESSION: 1. Generalized cerebral atrophy. 2. No acute intracranial abnormality. Electronically Signed   By: Virgina Norfolk M.D.   On: 02/04/2020 23:23   DG Chest Portable 1 View  Result Date: 02/04/2020 CLINICAL DATA:  86 year old male with shortness of breath EXAM: PORTABLE CHEST 1 VIEW COMPARISON:  Chest radiograph dated 01/28/2020. FINDINGS: Bilateral lower lung field streaky densities, likely atelectasis. Pneumonia is not excluded. Clinical correlation is recommended. No large pleural effusion or pneumothorax. Stable cardiomegaly. Atherosclerotic calcification of the aorta. Large hiatal hernia containing distended bowel as seen on the prior radiograph. Associated obstruction is not excluded on this radiograph. IMPRESSION: 1. Bibasilar atelectasis versus infiltrate. 2. Large hiatal hernia containing distended bowel similar to prior radiograph. Electronically Signed   By: Anner Crete M.D.   On: 02/04/2020 20:43   ECHOCARDIOGRAM COMPLETE  Result Date: 02/05/2020    ECHOCARDIOGRAM REPORT   Patient Name:   Henry Ayala Date of Exam: 02/05/2020 Medical Rec #:  [id number]        Height:       67.0 in Accession #:    [id number]       Weight:       160.0 lb Date of Birth:  May 04, 1931        BSA:          1.839 m Patient Age:    70 years         BP:           103/71 mmHg Patient Gender: M                HR:           80 bpm. Exam Location:  Inpatient Procedure: 2D Echo, 3D Echo, Color Doppler, Cardiac Doppler and Strain Analysis Indications:     [id number] Acute systolic (congestive) heart failure  History:  Patient has prior history of Echocardiogram examinations, most                 recent 08/15/2017. CHF, CAD, Arrythmias:Atrial Fibrillation; Risk                 Factors:Dyslipidemia, Hypertension and Sleep Apnea.  Sonographer:    Raquel Sarna Senior RDCS Referring Phys: [id number] [place]  1. Left ventricular ejection fraction, by estimation, is 15-20%. The left ventricle has severely decreased function. The left ventricle demonstrates global hypokinesis. The left ventricular internal cavity size was severely dilated. Left ventricular diastolic function could not be evaluated.  2. Right ventricular systolic function is low normal. The right ventricular size is normal. There is mildly elevated pulmonary artery systolic pressure. The estimated right ventricular systolic pressure is [id number] mmHg.  3. Left atrial size was severely dilated.  4. The mitral valve is grossly normal. Mild to moderate mitral valve regurgitation.  5. The aortic valve is tricuspid. Aortic valve regurgitation is mild. No aortic stenosis is present.  6. There is mild dilatation of the ascending aorta measuring 41 mm.  7. The inferior vena cava is normal in size with <50% respiratory variability, suggesting right atrial pressure of 8 mmHg. Comparison(s): A prior study was performed on 08/15/2017. Prior images unable to be directly viewed, comparison made by report only. Changes from prior study are noted. EF now 15-20%. FINDINGS  Left Ventricle: Left ventricular ejection fraction, by estimation, is 15-20%. The left ventricle has severely decreased function. The left ventricle demonstrates global hypokinesis. The left ventricular internal cavity size was severely dilated. There is no left ventricular hypertrophy. Abnormal (paradoxical) septal motion, consistent with left bundle branch block. Left ventricular diastolic function could not be evaluated due to atrial fibrillation.  Left ventricular diastolic function could not be evaluated. Right Ventricle: The right ventricular size is normal. No increase in right ventricular wall thickness. Right ventricular systolic function is low normal. There is mildly elevated pulmonary artery systolic pressure. The tricuspid regurgitant velocity is 2.88 m/s, and with an assumed right atrial pressure of 8 mmHg, the estimated right ventricular systolic pressure is [id number] mmHg. Left Atrium: Left atrial size was severely dilated. Right Atrium: Right atrial size was normal in size. Pericardium: Trivial pericardial effusion is present. Presence of pericardial fat pad. Mitral Valve: The mitral valve is grossly normal. Mild to moderate mitral valve regurgitation. Tricuspid Valve: The tricuspid valve is grossly normal. Tricuspid valve regurgitation is mild . No evidence of tricuspid stenosis. Aortic Valve: The aortic valve is tricuspid. Aortic valve regurgitation is mild. Aortic regurgitation PHT measures 1089 msec. No aortic stenosis is present. Pulmonic Valve: The pulmonic valve was grossly normal. Pulmonic valve regurgitation is trivial. No evidence of pulmonic stenosis. Aorta: The aortic root is normal in size and structure. There is mild dilatation of the ascending aorta measuring 41 mm. Venous: The inferior vena cava is normal in size with less than 50% respiratory variability, suggesting right atrial pressure of 8 mmHg. IAS/Shunts: No atrial level shunt detected by color flow Doppler.  LEFT VENTRICLE PLAX 2D LVIDd:         7.00 cm LVIDs:         6.40 cm LV PW:         0.80 cm LV IVS:        0.90 cm      3D Volume EF LVOT diam:     2.10 cm      LV 3D EF:  19.00 % LV SV:         35 LV SV Index:   19           3D Volume EF: LVOT Area:     3.46 cm     3D EF:        19 %  LV Volumes (MOD) LV vol d, MOD A2C: 230.0 ml LV vol d, MOD A4C: 192.0 ml LV vol s, MOD A2C: 180.0 ml LV vol s, MOD A4C: 154.0 ml LV SV MOD A2C:     50.0 ml LV SV MOD A4C:     192.0 ml LV SV  MOD BP:      44.2 ml RIGHT VENTRICLE RV S prime:     10.60 cm/s TAPSE (M-mode): 1.4 cm LEFT ATRIUM              Index       RIGHT ATRIUM           Index LA diam:        5.20 cm  2.83 cm/m  RA Area:     20.90 cm LA Vol (A2C):   130.0 ml 70.68 ml/m RA Volume:   63.00 ml  34.25 ml/m LA Vol (A4C):   102.0 ml 55.46 ml/m LA Biplane Vol: 117.0 ml 63.61 ml/m  AORTIC VALVE LVOT Vmax:   71.80 cm/s LVOT Vmean:  44.433 cm/s LVOT VTI:    0.101 m AI PHT:      1089 msec  AORTA Ao Root diam: 3.10 cm Ao Asc diam:  4.10 cm TRICUSPID VALVE TR Peak grad:   33.2 mmHg TR Vmax:        288.00 cm/s  SHUNTS Systemic VTI:  0.10 m Systemic Diam: 2.10 cm Eleonore Chiquito MD Electronically signed by Eleonore Chiquito MD Signature Date/Time: 02/05/2020/1:25:06 PM    Final      Scheduled Meds: . apixaban  5 mg Oral BID  . metoprolol succinate  25 mg Oral Daily  . pantoprazole  40 mg Oral BID  . pravastatin  40 mg Oral q1800  . sodium chloride flush  3 mL Intravenous Once  . sodium chloride flush  3 mL Intravenous Q12H  . spironolactone  25 mg Oral Daily   Continuous Infusions: . sodium chloride       LOS: 2 days   Arlan Organ, DO [facility]  If 7PM-7AM, please contact night-coverage [email or web address] 02/06/2020, 1:36 PM

## 2020-02-06 NOTE — Evaluation (Signed)
Occupational Therapy Evaluation Patient Details Name: Henry Ayala MRN: [id number] DOB: 1931/02/08 Today's Date: 02/06/2020    History of Present Illness 86 y.o. male with medical history significant for atrial fibrillation on Eliquis, coronary artery disease, chronic systolic CHF, and large hiatal hernia, now presenting to the emergency department for evaluation of exertional dyspnea and right lower leg swelling.    Clinical Impression   Pt lives alone with part time assistance of a caregiver for med management and IADL. Pt presents with mild unsteadiness, generalized weakness and impaired cognition. Daughter present during eval and concerned whether pt is safe to continue to live alone. Recommended ST rehab as family considers further cognitive testing and possible longer term options for pt. Will follow acutely.    Follow Up Recommendations  SNF;Supervision/Assistance - 24 hour    Equipment Recommendations  Tub/shower seat    Recommendations for Other Services       Precautions / Restrictions Precautions Precautions: Fall Restrictions Weight Bearing Restrictions: No      Mobility Bed Mobility Overal bed mobility: Independent      General bed mobility comments: no assist  Transfers Overall transfer level: Needs assistance Equipment used: 1 person hand held assist Transfers: Sit to/from Stand Sit to Stand: Min guard        General transfer comment: min guard for safety, pt reaching for furniture as he walked within room    Balance Overall balance assessment: Needs assistance   Sitting balance-Leahy Scale: Good       Standing balance-Leahy Scale: Fair                             ADL either performed or assessed with clinical judgement   ADL Overall ADL's : Needs assistance/impaired Eating/Feeding: Independent   Grooming: Min guard;Wash/dry hands;Standing   Upper Body Bathing: Supervision/ safety;Set up;Sitting   Lower Body Bathing: Min  guard;Sit to/from stand   Upper Body Dressing : Minimal assistance;Sitting   Lower Body Dressing: Min guard;Sit to/from stand   Toilet Transfer: Min guard;Ambulation           Functional mobility during ADLs: Min guard General ADL Comments: Daughter reports pt sleeps in front of the tv much of the day and is awake all night. Notes decrease in cognition over the last year.     Vision Baseline Vision/History: Wears glasses Wears Glasses: Reading only Patient Visual Report: No change from baseline       Perception     Praxis      Pertinent Vitals/Pain Pain Assessment: No/denies pain Faces Pain Scale: No hurt Pain Intervention(s): Limited activity within patient's tolerance     Hand Dominance Right   Extremity/Trunk Assessment Upper Extremity Assessment Upper Extremity Assessment: Overall WFL for tasks assessed   Lower Extremity Assessment Lower Extremity Assessment: Defer to PT evaluation   Cervical / Trunk Assessment Cervical / Trunk Assessment: Normal   Communication Communication Communication: HOH   Cognition Arousal/Alertness: Awake/alert Behavior During Therapy: Restless Overall Cognitive Status: Impaired/Different from baseline Area of Impairment: Memory;Safety/judgement;Problem solving                     Memory: Decreased short-term memory;Decreased recall of precautions   Safety/Judgement: Decreased awareness of safety;Decreased awareness of deficits   Problem Solving: Slow processing;Decreased initiation     General Comments       Exercises     Shoulder Instructions      Home Living Family/patient expects  to be discharged to:: Skilled nursing facility Living Arrangements: Alone Available Help at Discharge: Personal care attendant(part time)                                    Prior Functioning/Environment Level of Independence: Needs assistance  Gait / Transfers Assistance Needed: walks without a device ADL's /  Homemaking Assistance Needed: [name] his caregiver helps with medications, cooking, cleaning, pt reports independence in self care and driving            OT Problem List: Impaired balance (sitting and/or standing);Decreased safety awareness;Decreased cognition      OT Treatment/Interventions: Self-care/ADL training;DME and/or AE instruction;Patient/family education;Balance training;Therapeutic activities;Cognitive remediation/compensation    OT Goals(Current goals can be found in the care plan section) Acute Rehab OT Goals Patient Stated Goal: daughter wants ST rehab in SNF OT Goal Formulation: With patient Time For Goal Achievement: 02/20/20 Potential to Achieve Goals: Good ADL Goals Pt Will Perform Grooming: with supervision;standing Pt Will Perform Lower Body Bathing: with supervision;sit to/from stand Pt Will Perform Lower Body Dressing: with supervision;sit to/from stand Pt Will Transfer to Toilet: with supervision;ambulating;regular height toilet Pt Will Perform Toileting - Clothing Manipulation and hygiene: with supervision Additional ADL Goal #1: Pt will identify and gather items necessary for ADL around his room with supervision.  OT Frequency: Min 2X/week   Barriers to D/C: Decreased caregiver support          Co-evaluation              AM-PAC OT "6 Clicks" Daily Activity     Outcome Measure Help from another person eating meals?: None Help from another person taking care of personal grooming?: A Little Help from another person toileting, which includes using toliet, bedpan, or urinal?: A Little Help from another person bathing (including washing, rinsing, drying)?: A Little Help from another person to put on and taking off regular upper body clothing?: None Help from another person to put on and taking off regular lower body clothing?: A Little 6 Click Score: 20   End of Session    Activity Tolerance: Patient tolerated treatment well Patient left: in  chair;with call bell/phone within reach;with family/visitor present  OT Visit Diagnosis: Unsteadiness on feet (R26.81);Other symptoms and signs involving cognitive function                Time: [id number] OT Time Calculation (min): 24 min Charges:  OT General Charges $OT Visit: 1 Visit OT Evaluation $OT Eval Moderate Complexity: 1 Mod OT Treatments $Self Care/Home Management : 8-22 mins  Nestor Lewandowsky, OTR/L Acute Rehabilitation Services Pager: [phone] Office: [phone]  Malka So 02/06/2020, 3:14 PM

## 2020-02-06 NOTE — Progress Notes (Signed)
Progress Note  Patient Name: Henry Ayala Date of Encounter: 02/06/2020  Primary Cardiologist: Rozann Lesches, MD   Subjective   Some ongoing SOB  Inpatient Medications    Scheduled Meds: . apixaban  5 mg Oral BID  . furosemide  40 mg Intravenous Daily  . metoprolol succinate  25 mg Oral Daily  . pantoprazole  40 mg Oral BID  . pravastatin  40 mg Oral q1800  . sodium chloride flush  3 mL Intravenous Once  . sodium chloride flush  3 mL Intravenous Q12H  . spironolactone  25 mg Oral Daily   Continuous Infusions: . sodium chloride     PRN Meds: sodium chloride, acetaminophen, ondansetron (ZOFRAN) IV, sodium chloride flush   Vital Signs    Vitals:   02/05/20 1602 02/05/20 2029 02/06/20 0512 02/06/20 0902  BP:  117/79 (!) 81/68 126/86  Pulse:  90 85 90  Resp: 19 20 19    Temp:  97.7 F (36.5 C) 97.7 F (36.5 C)   TempSrc:  Oral Oral   SpO2:  94% 97%   Weight:   68.4 kg   Height:       No intake or output data in the 24 hours ending 02/06/20 0945 Last 3 Weights 02/06/2020 02/05/2020 02/04/2020  Weight (lbs) 150 lb 12.8 oz 147 lb 9.6 oz 160 lb  Weight (kg) 68.402 kg 66.951 kg 72.576 kg      Telemetry    Rate controlled afib- Personally Reviewed  ECG    N/a - Personally Reviewed  Physical Exam   GEN: No acute distress.   Neck: No JVD Cardiac: irreg no murmurs, rubs, or gallops.  Respiratory: Clear to auscultation bilaterally. GI: Soft, nontender, non-distended  MS: No edema; No deformity. Neuro:  Nonfocal  Psych: Normal affect   Labs    High Sensitivity Troponin:   Recent Labs  Lab 02/04/20 2136 02/04/20 2335  TROPONINIHS 65* 64*      Chemistry Recent Labs  Lab 02/04/20 1804 02/05/20 1609 02/06/20 0441  NA 141 140 142  K 4.0 4.0 4.1  CL 102 101 99  CO2 28 29 32  GLUCOSE 124* 144* 116*  BUN 20 22 22   CREATININE 1.14 1.21 1.25*  CALCIUM 9.1 9.2 9.4  ALBUMIN  --  3.3* 3.4*  GFRNONAA 58* 54* 52*  GFRAA >60 >60 >60  ANIONGAP 11 10  11      Hematology Recent Labs  Lab 02/04/20 1804 02/06/20 0441  WBC 7.2 8.2  RBC 4.69 4.93  HGB 15.5 16.5  HCT 47.6 50.0  MCV 101.5* 101.4*  MCH 33.0 33.5  MCHC 32.6 33.0  RDW 13.5 13.8  PLT 170 180    BNP Recent Labs  Lab 02/04/20 2050  BNP 756.6*     DDimer  Recent Labs  Lab 02/05/20 0030  DDIMER 0.35     Radiology    CT Head Wo Contrast  Result Date: 02/04/2020 CLINICAL DATA:  Generalized weakness. EXAM: CT HEAD WITHOUT CONTRAST TECHNIQUE: Contiguous axial images were obtained from the base of the skull through the vertex without intravenous contrast. COMPARISON:  February 12, 2016 FINDINGS: Brain: There is mild cerebral atrophy with widening of the extra-axial spaces and ventricular dilatation. There are areas of decreased attenuation within the white matter tracts of the supratentorial brain, consistent with microvascular disease changes. Vascular: No hyperdense vessel or unexpected calcification. Skull: Normal. Negative for fracture or focal lesion. Sinuses/Orbits: No acute finding. Other: None. IMPRESSION: 1. Generalized cerebral atrophy. 2. No  acute intracranial abnormality. Electronically Signed   By: Virgina Norfolk M.D.   On: 02/04/2020 23:23   DG Chest Portable 1 View  Result Date: 02/04/2020 CLINICAL DATA:  86 year old male with shortness of breath EXAM: PORTABLE CHEST 1 VIEW COMPARISON:  Chest radiograph dated 01/28/2020. FINDINGS: Bilateral lower lung field streaky densities, likely atelectasis. Pneumonia is not excluded. Clinical correlation is recommended. No large pleural effusion or pneumothorax. Stable cardiomegaly. Atherosclerotic calcification of the aorta. Large hiatal hernia containing distended bowel as seen on the prior radiograph. Associated obstruction is not excluded on this radiograph. IMPRESSION: 1. Bibasilar atelectasis versus infiltrate. 2. Large hiatal hernia containing distended bowel similar to prior radiograph. Electronically Signed   By:  Anner Crete M.D.   On: 02/04/2020 20:43   ECHOCARDIOGRAM COMPLETE  Result Date: 02/05/2020    ECHOCARDIOGRAM REPORT   Patient Name:   Henry Ayala Date of Exam: 02/05/2020 Medical Rec #:  [id number]        Height:       67.0 in Accession #:    [id number]       Weight:       160.0 lb Date of Birth:  [birth year]        BSA:          1.839 m Patient Age:    29 years         BP:           103/71 mmHg Patient Gender: M                HR:           80 bpm. Exam Location:  Inpatient Procedure: 2D Echo, 3D Echo, Color Doppler, Cardiac Doppler and Strain Analysis Indications:    [id number] Acute systolic (congestive) heart failure  History:        Patient has prior history of Echocardiogram examinations, most                 recent 08/15/2017. CHF, CAD, Arrythmias:Atrial Fibrillation; Risk                 Factors:Dyslipidemia, Hypertension and Sleep Apnea.  Sonographer:    Raquel Sarna Senior RDCS Referring Phys: [id number] [place]  1. Left ventricular ejection fraction, by estimation, is 15-20%. The left ventricle has severely decreased function. The left ventricle demonstrates global hypokinesis. The left ventricular internal cavity size was severely dilated. Left ventricular diastolic function could not be evaluated.  2. Right ventricular systolic function is low normal. The right ventricular size is normal. There is mildly elevated pulmonary artery systolic pressure. The estimated right ventricular systolic pressure is [id number] mmHg.  3. Left atrial size was severely dilated.  4. The mitral valve is grossly normal. Mild to moderate mitral valve regurgitation.  5. The aortic valve is tricuspid. Aortic valve regurgitation is mild. No aortic stenosis is present.  6. There is mild dilatation of the ascending aorta measuring 41 mm.  7. The inferior vena cava is normal in size with <50% respiratory variability, suggesting right atrial pressure of 8 mmHg. Comparison(s): A prior study was performed on 08/15/2017. Prior  images unable to be directly viewed, comparison made by report only. Changes from prior study are noted. EF now 15-20%. FINDINGS  Left Ventricle: Left ventricular ejection fraction, by estimation, is 15-20%. The left ventricle has severely decreased function. The left ventricle demonstrates global hypokinesis. The left ventricular internal cavity size was severely dilated. There is no left ventricular hypertrophy. Abnormal (paradoxical)  septal motion, consistent with left bundle Advik Weatherspoon block. Left ventricular diastolic function could not be evaluated due to atrial fibrillation. Left ventricular diastolic function could not be evaluated. Right Ventricle: The right ventricular size is normal. No increase in right ventricular wall thickness. Right ventricular systolic function is low normal. There is mildly elevated pulmonary artery systolic pressure. The tricuspid regurgitant velocity is 2.88 m/s, and with an assumed right atrial pressure of 8 mmHg, the estimated right ventricular systolic pressure is [id number] mmHg. Left Atrium: Left atrial size was severely dilated. Right Atrium: Right atrial size was normal in size. Pericardium: Trivial pericardial effusion is present. Presence of pericardial fat pad. Mitral Valve: The mitral valve is grossly normal. Mild to moderate mitral valve regurgitation. Tricuspid Valve: The tricuspid valve is grossly normal. Tricuspid valve regurgitation is mild . No evidence of tricuspid stenosis. Aortic Valve: The aortic valve is tricuspid. Aortic valve regurgitation is mild. Aortic regurgitation PHT measures 1089 msec. No aortic stenosis is present. Pulmonic Valve: The pulmonic valve was grossly normal. Pulmonic valve regurgitation is trivial. No evidence of pulmonic stenosis. Aorta: The aortic root is normal in size and structure. There is mild dilatation of the ascending aorta measuring 41 mm. Venous: The inferior vena cava is normal in size with less than 50% respiratory variability,  suggesting right atrial pressure of 8 mmHg. IAS/Shunts: No atrial level shunt detected by color flow Doppler.  LEFT VENTRICLE PLAX 2D LVIDd:         7.00 cm LVIDs:         6.40 cm LV PW:         0.80 cm LV IVS:        0.90 cm      3D Volume EF LVOT diam:     2.10 cm      LV 3D EF:    19.00 % LV SV:         35 LV SV Index:   19           3D Volume EF: LVOT Area:     3.46 cm     3D EF:        19 %  LV Volumes (MOD) LV vol d, MOD A2C: 230.0 ml LV vol d, MOD A4C: 192.0 ml LV vol s, MOD A2C: 180.0 ml LV vol s, MOD A4C: 154.0 ml LV SV MOD A2C:     50.0 ml LV SV MOD A4C:     192.0 ml LV SV MOD BP:      44.2 ml RIGHT VENTRICLE RV S prime:     10.60 cm/s TAPSE (M-mode): 1.4 cm LEFT ATRIUM              Index       RIGHT ATRIUM           Index LA diam:        5.20 cm  2.83 cm/m  RA Area:     20.90 cm LA Vol (A2C):   130.0 ml 70.68 ml/m RA Volume:   63.00 ml  34.25 ml/m LA Vol (A4C):   102.0 ml 55.46 ml/m LA Biplane Vol: 117.0 ml 63.61 ml/m  AORTIC VALVE LVOT Vmax:   71.80 cm/s LVOT Vmean:  44.433 cm/s LVOT VTI:    0.101 m AI PHT:      1089 msec  AORTA Ao Root diam: 3.10 cm Ao Asc diam:  4.10 cm TRICUSPID VALVE TR Peak grad:   33.2 mmHg TR Vmax:  288.00 cm/s  SHUNTS Systemic VTI:  0.10 m Systemic Diam: 2.10 cm Eleonore Chiquito MD Electronically signed by Eleonore Chiquito MD Signature Date/Time: 02/05/2020/1:25:06 PM    Final     Cardiac Studies     Patient Profile    Henry Ayala is a 86 y.o. male with a history of CAD s/p PTCA of LAD in 1992 and BMS to RCA in [id number], chronic systolic CHF/ischemic cardiomyopathy with EF of 25-30% in 08/2017, chronic atrial fibrillation on Eliquis, sleep apnea, hypertension, hyperlipidemia, GERD, and BPH who is being seen today for the evaluation of acute on chronic CHF at the request of Dr. Marylyn Ishihara.  Assessment & Plan    1. Acute on chronic systolic HF/ICM - [id number] echo [name] 15-20%, low normal RV function, mild to mod MR - 2018 echo [name] 25-30% - BNP elevated at 756. Chest  x-ray showed bibasilar atelectasis vs infiltrate  - from Dr Delphina Cahill notes discussed concerns about progressing HF with patient and possibly progression of CAD, he favors just medical therapy at this time as opposed to repeat cath  - I/Os incomplete. Weights look inaccurate. He is on lasix IV 40mg  daily. Mild uptrend in Cr.  - other medical therapy with toprol 25, aldactone 25. May add low dose ARB pending renal function and bp's this admission  - some ongoing SOB, exam not overally impressive for fluid overload but given ongoing symptoms will work toward further diuresis. Does IV lasix 40mg  x 1 today and reassess tomorrow - have nursing ambulate patient, get standing weight.   2. CAD - History of PTCA to LAD in 1992 and BMS to RCA in 2004. Lexiscan in 2018 showed a large region of scar involving the anterior, anteroseptal, and inferior walls without any active ischemia. Medical therapy recommended at that time - mld trop in setting of HF - no plans for ischemic testing at this time  3. Chronic afib - rate controlled - on eliquis for stroke prevention  For questions or updates, please contact [place] Please consult [email or web address] for contact info under        Signed, Carlyle Dolly, MD  02/06/2020, 9:45 AM

## 2020-02-07 LAB — BASIC METABOLIC PANEL
Anion gap: 11 (ref 5–15)
BUN: 28 mg/dL — ABNORMAL HIGH (ref 8–23)
CO2: 32 mmol/L (ref 22–32)
Calcium: 9.4 mg/dL (ref 8.9–10.3)
Chloride: 99 mmol/L (ref 98–111)
Creatinine, Ser: 1.17 mg/dL (ref 0.61–1.24)
GFR calc Af Amer: >60 mL/min (ref >60)
GFR calc non Af Amer: 56 mL/min — ABNORMAL LOW (ref >60)
Glucose, Bld: 93 mg/dL (ref 70–99)
Potassium: 4.2 mmol/L (ref 3.5–5.1)
Sodium: 142 mmol/L (ref 135–145)

## 2020-02-07 LAB — MAGNESIUM: Magnesium: 1.9 mg/dL (ref 1.7–2.4)

## 2020-02-07 MED ORDER — LORAZEPAM 2 MG/ML IJ SOLN
0.5000 mg | Freq: Once | INTRAMUSCULAR | Status: AC | PRN
  Administered 2020-02-08: 0.25 mg via INTRAVENOUS
  Filled 2020-02-07: qty 1

## 2020-02-07 MED ORDER — LORAZEPAM 2 MG/ML IJ SOLN
0.5000 mg | Freq: Once | INTRAMUSCULAR | Status: AC
  Administered 2020-02-07: 0.5 mg via INTRAVENOUS
  Filled 2020-02-07: qty 1

## 2020-02-07 MED ORDER — FUROSEMIDE 40 MG PO TABS
40.0000 mg | ORAL_TABLET | Freq: Every day | ORAL | Status: DC
  Administered 2020-02-07 – 2020-02-09 (×3): 40 mg via ORAL
  Filled 2020-02-07 (×3): qty 1

## 2020-02-07 NOTE — Progress Notes (Signed)
I notified Dr. Harl Bowie on rounds that patient has period of bradycardia, heart rate dipping nonsustanined to the 30s then back up into the 50s to 60s. Patient is asleep and blood pressure is stable and charted

## 2020-02-07 NOTE — Progress Notes (Signed)
Beginning of shift pt continually tried to get out of bed out of confusion. (He has not slept any for almost 2 days I was told). I administered medication per order. Med was not effective-  med- HR in 90- 120's. RR 20-30's- O2 sats 80s-90s, place patient on 2L [place] BP 134/78, EKG was done, Temp. 99.38F,  (rectal)- pt continued to be very "busy" in bed- taking clothes off saying "he was hot."  I paged [facility] on call with the above information and administered medication per order.

## 2020-02-07 NOTE — Progress Notes (Addendum)
Progress Note  Patient Name: Henry Ayala Date of Encounter: 02/07/2020  Primary Cardiologist: Rozann Lesches, MD   Subjective   Some issues with confusion and agitation overnight.   Inpatient Medications    Scheduled Meds: . apixaban  5 mg Oral BID  . metoprolol succinate  25 mg Oral Daily  . pantoprazole  40 mg Oral BID  . pravastatin  40 mg Oral q1800  . sodium chloride flush  3 mL Intravenous Once  . sodium chloride flush  3 mL Intravenous Q12H  . spironolactone  25 mg Oral Daily   Continuous Infusions: . sodium chloride     PRN Meds: sodium chloride, acetaminophen, ondansetron (ZOFRAN) IV, sodium chloride flush   Vital Signs    Vitals:   02/06/20 2256 02/06/20 2306 02/07/20 0004 02/07/20 0423  BP:   134/78 (!) 94/52  Pulse:   84 (!) 57  Resp: (!) 24 (!) 28 20 15   Temp:   99.3 F (37.4 C) 97.6 F (36.4 C)  TempSrc:   Rectal Axillary  SpO2:   93% 100%  Weight:    65.9 kg  Height:        Intake/Output Summary (Last 24 hours) at 02/07/2020 0847 Last data filed at 02/07/2020 0424 Gross per 24 hour  Intake 100 ml  Output 2325 ml  Net -2225 ml   Last 3 Weights 02/07/2020 02/06/2020 02/06/2020  Weight (lbs) 145 lb 4.8 oz 144 lb 9.6 oz 150 lb 12.8 oz  Weight (kg) 65.908 kg 65.59 kg 68.402 kg      Telemetry    afib rate controlled, occasional mild bradycardia - Personally Reviewed  ECG    n/a - Personally Reviewed  Physical Exam   GEN: No acute distress.   Neck: No JVD Cardiac: irreg, no murmurs, rubs, or gallops.  Respiratory: Clear to auscultation bilaterally. GI: Soft, nontender, non-distended  MS: No edema; No deformity. Neuro:  Nonfocal  Psych: sedated, unable to assess  Labs    High Sensitivity Troponin:   Recent Labs  Lab 02/04/20 2136 02/04/20 2335  TROPONINIHS 65* 64*      Chemistry Recent Labs  Lab 02/04/20 1804 02/05/20 1609 02/06/20 0441  NA 141 140 142  K 4.0 4.0 4.1  CL 102 101 99  CO2 28 29 32  GLUCOSE 124*  144* 116*  BUN 20 22 22   CREATININE 1.14 1.21 1.25*  CALCIUM 9.1 9.2 9.4  ALBUMIN  --  3.3* 3.4*  GFRNONAA 58* 54* 52*  GFRAA >60 >60 >60  ANIONGAP 11 10 11      Hematology Recent Labs  Lab 02/04/20 1804 02/06/20 0441  WBC 7.2 8.2  RBC 4.69 4.93  HGB 15.5 16.5  HCT 47.6 50.0  MCV 101.5* 101.4*  MCH 33.0 33.5  MCHC 32.6 33.0  RDW 13.5 13.8  PLT 170 180    BNP Recent Labs  Lab 02/04/20 2050  BNP 756.6*     DDimer  Recent Labs  Lab 02/05/20 0030  DDIMER 0.35     Radiology    ECHOCARDIOGRAM COMPLETE  Result Date: 02/05/2020    ECHOCARDIOGRAM REPORT   Patient Name:   ANTRELL ABRAHA Date of Exam: 02/05/2020 Medical Rec #:  [id number]        Height:       67.0 in Accession #:    [id number]       Weight:       160.0 lb Date of Birth:  July 25, 1931  BSA:          1.839 m Patient Age:    35 years         BP:           103/71 mmHg Patient Gender: M                HR:           80 bpm. Exam Location:  Inpatient Procedure: 2D Echo, 3D Echo, Color Doppler, Cardiac Doppler and Strain Analysis Indications:    [id number] Acute systolic (congestive) heart failure  History:        Patient has prior history of Echocardiogram examinations, most                 recent 08/15/2017. CHF, CAD, Arrythmias:Atrial Fibrillation; Risk                 Factors:Dyslipidemia, Hypertension and Sleep Apnea.  Sonographer:    Raquel Sarna Senior RDCS Referring Phys: [id number] [place]  1. Left ventricular ejection fraction, by estimation, is 15-20%. The left ventricle has severely decreased function. The left ventricle demonstrates global hypokinesis. The left ventricular internal cavity size was severely dilated. Left ventricular diastolic function could not be evaluated.  2. Right ventricular systolic function is low normal. The right ventricular size is normal. There is mildly elevated pulmonary artery systolic pressure. The estimated right ventricular systolic pressure is [id number] mmHg.  3. Left atrial  size was severely dilated.  4. The mitral valve is grossly normal. Mild to moderate mitral valve regurgitation.  5. The aortic valve is tricuspid. Aortic valve regurgitation is mild. No aortic stenosis is present.  6. There is mild dilatation of the ascending aorta measuring 41 mm.  7. The inferior vena cava is normal in size with <50% respiratory variability, suggesting right atrial pressure of 8 mmHg. Comparison(s): A prior study was performed on 08/15/2017. Prior images unable to be directly viewed, comparison made by report only. Changes from prior study are noted. EF now 15-20%. FINDINGS  Left Ventricle: Left ventricular ejection fraction, by estimation, is 15-20%. The left ventricle has severely decreased function. The left ventricle demonstrates global hypokinesis. The left ventricular internal cavity size was severely dilated. There is no left ventricular hypertrophy. Abnormal (paradoxical) septal motion, consistent with left bundle Bowyn Mercier block. Left ventricular diastolic function could not be evaluated due to atrial fibrillation. Left ventricular diastolic function could not be evaluated. Right Ventricle: The right ventricular size is normal. No increase in right ventricular wall thickness. Right ventricular systolic function is low normal. There is mildly elevated pulmonary artery systolic pressure. The tricuspid regurgitant velocity is 2.88 m/s, and with an assumed right atrial pressure of 8 mmHg, the estimated right ventricular systolic pressure is [id number] mmHg. Left Atrium: Left atrial size was severely dilated. Right Atrium: Right atrial size was normal in size. Pericardium: Trivial pericardial effusion is present. Presence of pericardial fat pad. Mitral Valve: The mitral valve is grossly normal. Mild to moderate mitral valve regurgitation. Tricuspid Valve: The tricuspid valve is grossly normal. Tricuspid valve regurgitation is mild . No evidence of tricuspid stenosis. Aortic Valve: The aortic valve is  tricuspid. Aortic valve regurgitation is mild. Aortic regurgitation PHT measures 1089 msec. No aortic stenosis is present. Pulmonic Valve: The pulmonic valve was grossly normal. Pulmonic valve regurgitation is trivial. No evidence of pulmonic stenosis. Aorta: The aortic root is normal in size and structure. There is mild dilatation of the ascending aorta measuring 41 mm. Venous: The  inferior vena cava is normal in size with less than 50% respiratory variability, suggesting right atrial pressure of 8 mmHg. IAS/Shunts: No atrial level shunt detected by color flow Doppler.  LEFT VENTRICLE PLAX 2D LVIDd:         7.00 cm LVIDs:         6.40 cm LV PW:         0.80 cm LV IVS:        0.90 cm      3D Volume EF LVOT diam:     2.10 cm      LV 3D EF:    19.00 % LV SV:         35 LV SV Index:   19           3D Volume EF: LVOT Area:     3.46 cm     3D EF:        19 %  LV Volumes (MOD) LV vol d, MOD A2C: 230.0 ml LV vol d, MOD A4C: 192.0 ml LV vol s, MOD A2C: 180.0 ml LV vol s, MOD A4C: 154.0 ml LV SV MOD A2C:     50.0 ml LV SV MOD A4C:     192.0 ml LV SV MOD BP:      44.2 ml RIGHT VENTRICLE RV S prime:     10.60 cm/s TAPSE (M-mode): 1.4 cm LEFT ATRIUM              Index       RIGHT ATRIUM           Index LA diam:        5.20 cm  2.83 cm/m  RA Area:     20.90 cm LA Vol (A2C):   130.0 ml 70.68 ml/m RA Volume:   63.00 ml  34.25 ml/m LA Vol (A4C):   102.0 ml 55.46 ml/m LA Biplane Vol: 117.0 ml 63.61 ml/m  AORTIC VALVE LVOT Vmax:   71.80 cm/s LVOT Vmean:  44.433 cm/s LVOT VTI:    0.101 m AI PHT:      1089 msec  AORTA Ao Root diam: 3.10 cm Ao Asc diam:  4.10 cm TRICUSPID VALVE TR Peak grad:   33.2 mmHg TR Vmax:        288.00 cm/s  SHUNTS Systemic VTI:  0.10 m Systemic Diam: 2.10 cm Eleonore Chiquito MD Electronically signed by Eleonore Chiquito MD Signature Date/Time: 02/05/2020/1:25:06 PM    Final     Cardiac Studies     Patient Profile     Linton Ham Websteris a 86 y.o.malewith a history of CAD s/p PTCA of LAD in 1992 and BMS  to RCA in [id number], chronic systolic CHF/ischemic cardiomyopathy with EF of 25-30% in 08/2017, chronic atrial fibrillation on Eliquis, sleep apnea, hypertension, hyperlipidemia, GERD, and BPHwho is being seen today for the evaluation ofacute on chronic CHFat the request of Dr. Marylyn Ishihara.  Assessment & Plan    1. Acute on chronic systolic HF/ICM - [id number] echo [name] 15-20%, low normal RV function, mild to mod MR - 2018 echo [name] 25-30% - BNP elevated at 756. Chest x-ray showed bibasilar atelectasis vs infiltrate  - from Dr Delphina Cahill notes discussed concerns about progressing HF with patient and possibly progression of CAD, he favors just medical therapy at this time as opposed to repeat cath  - I/Os incomplete this admission. Negative 2.2 L yesterday. Received IV lasix 40mg  x 1 yesterday, labs pending this AM. Weight inaccurate overall this admit.  Standing weight yesterday 144 lbs, at his 3/18 clinic appt standing weight 156 lbs. Unclear dry weight.   - likely start oral diuretic today pendign labs. Would start lasix oral 40mg  daily when ready - continue to avoid starting ACE/ARB/ARNI due to occasional soft bp's  2. CAD - History of PTCA to LAD in 1992 and BMS to RCA in 2004. Lexiscan in 2018 showed a large region of scar involving the anterior, anteroseptal, and inferior walls without any active ischemia. Medical therapy recommended at that time - mld trop in setting of HF - no plans for ischemic testing at this time  3. Chronic afib - rate controlled - on eliquis for stroke prevention  - mild brady at times, but overall rates 50s to 60s - continue beta blocker   Will follow up labs, likely start oral diuretic pending results and likely sign off. Would hold on ACE/ARB/ARNI at this time, consider as outpatient depending on bp's and renal function.    Addenum: labs look good, we will start oral lasix 40mg  daily and sign off. We will arrange outpatient f/u  For questions or updates, please  contact [place] Please consult [email or web address] for contact info under        Signed, Carlyle Dolly, MD  02/07/2020, 8:47 AM

## 2020-02-07 NOTE — Progress Notes (Signed)
[name]  PROGRESS NOTE    Henry Ayala  [id number] DOB: 02-Nov-1931 DOA: 02/04/2020 PCP: [name], DO   Brief Narrative:   Henry Ayala is a 86 y.o. male with medical history significant for atrial fibrillation on Eliquis, coronary artery disease, chronic systolic CHF, and large hiatal hernia, now presenting to the emergency department for evaluation of exertional dyspnea and right lower leg swelling.  Patient has a part-time caregiver who assists with the history and reports that the patient has been appearing dyspneic with minimal exertion for the past couple days and she also noted swelling involving the lower right leg yesterday.  Patient himself has not been complaining of anything.  His caregiver reports that he has been developing dementia and has had more episodes of confusion recently.  There was some concern that recent addition of Remeron might be contributing to his worsening confusion and this was stopped.  His caregiver administers his medications once a day and then sets out his twice daily medications for the patient to take on his own, but she notes that he sometimes forgets to.  3/26: Spoke with dtr about symptoms and echo findings. His respiratory status is improving on lasix and we will continue that. Echo shows worsening of EF. Will ask cards to weigh in. Dtr also concerned about his weakness and mobility at home. Will have PT/OT eval.   3/27: Spoke to daughter again today. Major question today remains inpatient rehab versus home with home health once discharged. Otherwise patient with continued ongoing SOB although he feels improved overall.  3/28: Spoke to daughter at bedside. PT recommends SNF. Overnight patient became agitated and continued to try getting out of bed received 0.5 mg of IV ativan last night.   Assessment & Plan:   Principal Problem:   Acute on chronic systolic CHF (congestive heart failure) ([name]) Active Problems:   Chronic atrial  fibrillation ([name])   CAD (coronary artery disease)   Diaphragmatic hernia without obstruction and without gangrene   SOB (shortness of breath)  Exertional dyspnea Acute exacerbation of HFrEF      - Presents with a a couple days of exertional dyspnea and caretaker reports that his right lower leg had been swollen yesterday      - trp flat at 65, 64     - BNP 756     - lasix 40mg  IV given in ED; continue daily, follow labs     - d-dimer negative     - procal negative     - Echo with worsening HF; now [name] 15-20%; in 2018 it was 25-30%     - have consulted cards; appreciate assistance     - follow I&Os, dailt wts, continue aldactone     - Would benefit from RAASi however BP has been fluctuant along with mild increase in creatinine as well. Likely need to stabilize fluid status then add low dose ACEi.     - fluid restriction to 1800cc      - Discussed prognosis briefly with daughter with plan to hopefully get him to inpatient rehab and re-assess. If not continuing to improve she would be willing to talk to palliative about further options  Atrial fibrillation      - CHADS-VASC at least 54 (age x2, CAD, CHF)      - Continue Eliquis, Toprol    Hiatal hernia      - Appears stable on CXR      - Continue PPI  CAD; elevated troponin      - Denies chest pain but presents with DOE and found to have HS troponin of 65      - Continue cardiac monitoring, trend troponin, echo as above     - 3/26: see above  Generalized weakness Debility     - PT/OT eval   DVT prophylaxis: eliquis Code Status: FULL -- Discussed code status with patient and his daughter at bedside on 02/07/2020. Patient initially reports that he would want everything done which seemed to surprise his daughter. Instructed him to speak to his family about his wishes and educated him further on his overall prognosis and severity of illness.   Family Communication: Spoke with dtr at bedside. Concerned about discharge planning   Disposition Plan: Remains inpt for IV diuresis, PT/OT recommends SNF.   Consultants:   Cardiology  ROS:  Reports some dyspnea, weakness. Remainder 10-pt ROS is negative for all not previously mentioned.  Subjective: Patient noted to be lethargic this morning after receiving IV ativan last night for agitation. He was then re assessed this afternoon. He is alert and oriented although remains drowsy. He has no complaints and his daughter reports that he seems stronger with improved breathing as well.   Objective: Vitals:   02/06/20 2306 02/07/20 0004 02/07/20 0423 02/07/20 1031  BP:  134/78 (!) 94/52 (!) 146/110  Pulse:  84 (!) 57   Resp: (!) 28 20 15    Temp:  99.3 F (37.4 C) 97.6 F (36.4 C)   TempSrc:  Rectal Axillary   SpO2:  93% 100%   Weight:   65.9 kg   Height:        Intake/Output Summary (Last 24 hours) at 02/07/2020 1332 Last data filed at 02/07/2020 0424 Gross per 24 hour  Intake -  Output 1175 ml  Net -1175 ml   Filed Weights   02/06/20 0512 02/06/20 1500 02/07/20 0423  Weight: 68.4 kg 65.6 kg 65.9 kg    Examination:  General: 86 y.o. male resting in bed in NAD Cardiovascular:irregular,  +S1, S2, no m/g/r Respiratory: CTABL, no w/r/r, breathlessness w/ speech GI: BS+, NDNT, no masses noted, no organomegaly noted MSK: No e/c/c Neuro: Alert and oriented, follows commands Psyc: Appropriate interaction and affect, calm/cooperative   Data Reviewed: I have personally reviewed following labs and imaging studies.  CBC: Recent Labs  Lab 02/04/20 1804 02/06/20 0441  WBC 7.2 8.2  NEUTROABS  --  6.1  HGB 15.5 16.5  HCT 47.6 50.0  MCV 101.5* 101.4*  PLT 170 [id number]   Basic Metabolic Panel: Recent Labs  Lab 02/04/20 1804 02/05/20 1609 02/06/20 0441 02/07/20 0829  NA 141 140 142 142  K 4.0 4.0 4.1 4.2  CL 102 101 99 99  CO2 28 29 32 32  GLUCOSE 124* 144* 116* 93  BUN 20 22 22  28*  CREATININE 1.14 1.21 1.25* 1.17  CALCIUM 9.1 9.2 9.4 9.4  MG  --   --   1.8 1.9  PHOS  --  3.8 3.7  --    GFR: Estimated Creatinine Clearance: 42.2 mL/min (by C-G formula based on SCr of 1.17 mg/dL). Liver Function Tests: Recent Labs  Lab 02/05/20 1609 02/06/20 0441  ALBUMIN 3.3* 3.4*   No results for input(s): LIPASE, AMYLASE in the last 168 hours. No results for input(s): AMMONIA in the last 168 hours. Coagulation Profile: No results for input(s): INR, PROTIME in the last 168 hours. Cardiac Enzymes: No results for input(s): CKTOTAL, CKMB, CKMBINDEX,  TROPONINI in the last 168 hours. BNP (last 3 results) No results for input(s): PROBNP in the last 8760 hours. HbA1C: No results for input(s): HGBA1C in the last 72 hours. CBG: No results for input(s): GLUCAP in the last 168 hours. Lipid Profile: Recent Labs    02/06/20 0441  CHOL 115  HDL 38*  LDLCALC 63  TRIG 71  CHOLHDL 3.0   Thyroid Function Tests: Recent Labs    02/06/20 0441  TSH 1.146   Anemia Panel: Recent Labs    02/06/20 0441  VITAMINB12 472   Sepsis Labs: Recent Labs  Lab 02/04/20 2335  PROCALCITON <0.10    Recent Results (from the past 240 hour(s))  SARS CORONAVIRUS 2 (TAT 6-24 HRS) Nasopharyngeal Nasopharyngeal Swab     Status: None   Collection Time: 02/05/20 12:42 AM   Specimen: Nasopharyngeal Swab  Result Value Ref Range Status   SARS Coronavirus 2 NEGATIVE NEGATIVE Final    Comment: (NOTE) SARS-CoV-2 target nucleic acids are NOT DETECTED. The SARS-CoV-2 RNA is generally detectable in upper and lower respiratory specimens during the acute phase of infection. Negative results do not preclude SARS-CoV-2 infection, do not rule out co-infections with other pathogens, and should not be used as the sole basis for treatment or other patient management decisions. Negative results must be combined with clinical observations, patient history, and epidemiological information. The expected result is Negative. Fact Sheet for Patients:  SugarRoll.be Fact Sheet for Healthcare Providers: https://www.woods-mathews.com/ This test is not yet approved or cleared by the [place] FDA and  has been authorized for detection and/or diagnosis of SARS-CoV-2 by FDA under an Emergency Use Authorization (EUA). This EUA will remain  in effect (meaning this test can be used) for the duration of the COVID-19 declaration under Section 56 4(b)(1) of the Act, 21 U.S.C. section 360bbb-3(b)(1), unless the authorization is terminated or revoked sooner. Performed at [facility], [place] [address] [place], [place] [address]       Radiology Studies: No results found.   Scheduled Meds: . apixaban  5 mg Oral BID  . metoprolol succinate  25 mg Oral Daily  . pantoprazole  40 mg Oral BID  . pravastatin  40 mg Oral q1800  . sodium chloride flush  3 mL Intravenous Once  . sodium chloride flush  3 mL Intravenous Q12H  . spironolactone  25 mg Oral Daily   Continuous Infusions: . sodium chloride       LOS: 3 days   Arlan Organ, DO [facility]  If 7PM-7AM, please contact night-coverage [email or web address] 02/07/2020, 1:32 PM

## 2020-02-08 LAB — BASIC METABOLIC PANEL
Anion gap: 14 (ref 5–15)
BUN: 29 mg/dL — ABNORMAL HIGH (ref 8–23)
CO2: 34 mmol/L — ABNORMAL HIGH (ref 22–32)
Calcium: 9.5 mg/dL (ref 8.9–10.3)
Chloride: 92 mmol/L — ABNORMAL LOW (ref 98–111)
Creatinine, Ser: 1.35 mg/dL — ABNORMAL HIGH (ref 0.61–1.24)
GFR calc Af Amer: 55 mL/min — ABNORMAL LOW (ref >60)
GFR calc non Af Amer: 47 mL/min — ABNORMAL LOW (ref >60)
Glucose, Bld: 127 mg/dL — ABNORMAL HIGH (ref 70–99)
Potassium: 3.6 mmol/L (ref 3.5–5.1)
Sodium: 140 mmol/L (ref 135–145)

## 2020-02-08 LAB — CBC WITH DIFFERENTIAL/PLATELET
Abs Immature Granulocytes: 0.06 10*3/uL (ref 0.00–0.07)
Basophils Absolute: 0 10*3/uL (ref 0.0–0.1)
Basophils Relative: 0 %
Eosinophils Absolute: 0 10*3/uL (ref 0.0–0.5)
Eosinophils Relative: 0 %
HCT: 53.6 % — ABNORMAL HIGH (ref 39.0–52.0)
Hemoglobin: 17.9 g/dL — ABNORMAL HIGH (ref 13.0–17.0)
Immature Granulocytes: 0 %
Lymphocytes Relative: 15 %
Lymphs Abs: 2.2 10*3/uL (ref 0.7–4.0)
MCH: 33.3 pg (ref 26.0–34.0)
MCHC: 33.4 g/dL (ref 30.0–36.0)
MCV: 99.6 fL (ref 80.0–100.0)
Monocytes Absolute: 0.9 10*3/uL (ref 0.1–1.0)
Monocytes Relative: 6 %
Neutro Abs: 11.7 10*3/uL — ABNORMAL HIGH (ref 1.7–7.7)
Neutrophils Relative %: 79 %
Platelets: 227 10*3/uL (ref 150–400)
RBC: 5.38 MIL/uL (ref 4.22–5.81)
RDW: 13.7 % (ref 11.5–15.5)
WBC: 14.9 10*3/uL — ABNORMAL HIGH (ref 4.0–10.5)
nRBC: 0 % (ref 0.0–0.2)

## 2020-02-08 LAB — MAGNESIUM: Magnesium: 1.8 mg/dL (ref 1.7–2.4)

## 2020-02-08 MED ORDER — LORAZEPAM 2 MG/ML IJ SOLN: 0.5000 mg | Freq: Once | INTRAMUSCULAR | Status: DC | PRN

## 2020-02-08 MED ORDER — HALOPERIDOL LACTATE 5 MG/ML IJ SOLN: 1.0000 mg | Freq: Once | INTRAMUSCULAR | Status: DC | PRN

## 2020-02-08 NOTE — Progress Notes (Signed)
PROGRESS NOTE    Henry Ayala  NOM:[id number] DOB: [birth year] DOA: 02/04/2020 PCP: [name], DO  Brief Narrative:  Henry Ham Websteris a 86 y.o.malewith medical history significant foratrial fibrillation on Eliquis, coronary artery disease, chronic systolic CHF, and large hiatal hernia, now presenting to the emergency department for evaluation of exertional dyspnea and right lower leg swelling. Patient has a part-time caregiver who assists with the history and reports that the patient has been appearing dyspneic with minimal exertion for the past couple days and she also noted swelling involving the lower right leg yesterday. Patient himself has not been complaining of anything. His caregiver reports that he has been developing dementia and has had more episodes of confusion recently. There was some concern that recent addition of Remeron might be contributing to his worsening confusion and this was stopped. His caregiver administers his medications once a day and then sets out his twice daily medications for the patient to take on his own, but she notes that he sometimes forgets to.  Assessment & Plan:   Principal Problem:   Acute on chronic systolic CHF (congestive heart failure) ([name]) Active Problems:   Chronic atrial fibrillation ([name])   CAD (coronary artery disease)   Diaphragmatic hernia without obstruction and without gangrene   SOB (shortness of breath)  Exertional dyspnea Acute exacerbation of HFrEF      - Overall resolved     - trp flat at 65, 64     - BNP 756     - lasix oral     - Echo with worsening HF; now [name] 15-20%; in 2018 it was 25-30%     - Would benefit from RAASi however BP has been fluctuant along with mild increase in creatinine as well. Likely need to stabilize fluid status then cardiology recommends outpatient consideration of this     - fluid restriction to 1800cc      - Discussed prognosis briefly with daughter with plan to hopefully get  him to inpatient rehab and re-assess. If not continuing to improve she would be willing to talk to palliative about further options  Atrial fibrillation      - CHADS-VASC at least 30 (age x2, CAD, CHF)      - Continue Eliquis, Toprol    Hiatal hernia      - Appears stable on CXR      - Continue PPI    CAD; elevated troponin      - Denies chest pain but presents with DOE and found to have HS troponin of 65      - Stop cardiac monitoring   Generalized weakness Debility     - PT/OT eval awaiting SNF  DVT prophylaxis: Eliquis Code Status: full Family Communication: spoke with daughter at bedside Disposition Plan:  . Patient came from:home            . Anticipated d/c place:SNF . Barriers to d/c OR conditions which need to be met to effect a safe d/c: stable for SNF awaiting bed placement  Consultants:  Cardiology (signed off)  Procedures:   none  Antimicrobials:  none  Subjective: Feeling okay overall, still some weak. Some agitation overnight and got 0.25 ativan last night. Daughter present at bedside and helps to answer questions.   Objective: Vitals:   02/07/20 1612 02/07/20 2043 02/08/20 0459 02/08/20 0519  BP: (!) 105/93 (!) 127/92 (!) 131/92   Pulse:  (!) 30 99   Resp: 20 19 (!) 29 (!) 26  Temp:  (!) 97.5 F (36.4 C) (!) 97.5 F (36.4 C)   TempSrc:  Axillary Axillary   SpO2:  90% 92%   Weight:   64.5 kg   Height:        Intake/Output Summary (Last 24 hours) at 02/08/2020 1152 Last data filed at 02/08/2020 0300 Gross per 24 hour  Intake --  Output 700 ml  Net -700 ml   Filed Weights   02/06/20 1500 02/07/20 0423 02/08/20 0459  Weight: 65.6 kg 65.9 kg 64.5 kg    Examination:  General exam: Appears calm and comfortable  Respiratory system: Clear to auscultation. Respiratory effort normal. Cardiovascular system: S1 & S2 heard. No pedal edema. Gastrointestinal system: Abdomen is nondistended, soft and nontender. No organomegaly or masses felt.  Normal bowel sounds heard. Central nervous system: Alert and oriented. No focal neurological deficits. Extremities: Symmetric 5 x 5 power. Skin: No rashes, lesions or ulcers Psychiatry: Judgement and insight appear normal. Mood & affect appropriate.   Data Reviewed: I have personally reviewed following labs and imaging studies  CBC: Recent Labs  Lab 02/04/20 1804 02/06/20 0441 02/08/20 0335  WBC 7.2 8.2 14.9*  NEUTROABS  --  6.1 11.7*  HGB 15.5 16.5 17.9*  HCT 47.6 50.0 53.6*  MCV 101.5* 101.4* 99.6  PLT 170 180 923   Basic Metabolic Panel: Recent Labs  Lab 02/04/20 1804 02/05/20 1609 02/06/20 0441 02/07/20 0829 02/08/20 0335  NA 141 140 142 142 140  K 4.0 4.0 4.1 4.2 3.6  CL 102 101 99 99 92*  CO2 28 29 32 32 34*  GLUCOSE 124* 144* 116* 93 127*  BUN 20 22 22  28* 29*  CREATININE 1.14 1.21 1.25* 1.17 1.35*  CALCIUM 9.1 9.2 9.4 9.4 9.5  MG  --   --  1.8 1.9 1.8  PHOS  --  3.8 3.7  --   --    GFR: Estimated Creatinine Clearance: 35.8 mL/min (A) (by C-G formula based on SCr of 1.35 mg/dL (H)). Liver Function Tests: Recent Labs  Lab 02/05/20 1609 02/06/20 0441  ALBUMIN 3.3* 3.4*   No results for input(s): LIPASE, AMYLASE in the last 168 hours. No results for input(s): AMMONIA in the last 168 hours. Coagulation Profile: No results for input(s): INR, PROTIME in the last 168 hours. Cardiac Enzymes: No results for input(s): CKTOTAL, CKMB, CKMBINDEX, TROPONINI in the last 168 hours. BNP (last 3 results) No results for input(s): PROBNP in the last 8760 hours. HbA1C: No results for input(s): HGBA1C in the last 72 hours. CBG: No results for input(s): GLUCAP in the last 168 hours. Lipid Profile: Recent Labs    02/06/20 0441  CHOL 115  HDL 38*  LDLCALC 63  TRIG 71  CHOLHDL 3.0   Thyroid Function Tests: Recent Labs    02/06/20 0441  TSH 1.146   Anemia Panel: Recent Labs    02/06/20 0441  VITAMINB12 472   Sepsis Labs: Recent Labs  Lab 02/04/20 2335   PROCALCITON <0.10    Recent Results (from the past 240 hour(s))  SARS CORONAVIRUS 2 (TAT 6-24 HRS) Nasopharyngeal Nasopharyngeal Swab     Status: None   Collection Time: 02/05/20 12:42 AM   Specimen: Nasopharyngeal Swab  Result Value Ref Range Status   SARS Coronavirus 2 NEGATIVE NEGATIVE Final    Comment: (NOTE) SARS-CoV-2 target nucleic acids are NOT DETECTED. The SARS-CoV-2 RNA is generally detectable in upper and lower respiratory specimens during the acute phase of infection. Negative results do not preclude  SARS-CoV-2 infection, do not rule out co-infections with other pathogens, and should not be used as the sole basis for treatment or other patient management decisions. Negative results must be combined with clinical observations, patient history, and epidemiological information. The expected result is Negative. Fact Sheet for Patients: SugarRoll.be Fact Sheet for Healthcare Providers: https://www.woods-mathews.com/ This test is not yet approved or cleared by the [place] FDA and  has been authorized for detection and/or diagnosis of SARS-CoV-2 by FDA under an Emergency Use Authorization (EUA). This EUA will remain  in effect (meaning this test can be used) for the duration of the COVID-19 declaration under Section 56 4(b)(1) of the Act, 21 U.S.C. section 360bbb-3(b)(1), unless the authorization is terminated or revoked sooner. Performed at [facility], [place] [address], [place], [place] [address]     Radiology Studies: No results found.  Scheduled Meds: . apixaban  5 mg Oral BID  . furosemide  40 mg Oral Daily  . metoprolol succinate  25 mg Oral Daily  . pantoprazole  40 mg Oral BID  . pravastatin  40 mg Oral q1800  . sodium chloride flush  3 mL Intravenous Once  . sodium chloride flush  3 mL Intravenous Q12H  . spironolactone  25 mg Oral Daily   Continuous Infusions: . sodium chloride       LOS: 4 days    Time spent: 25  Hoyt Koch, MD [facility]  To contact the attending provider between 7A-7P or the covering provider during after hours 7P-7A, please log into the web site [email or web address] and access using universal [place] password for that web site. If you do not have the password, please call the hospital operator.  02/08/2020, 11:52 AM

## 2020-02-08 NOTE — TOC Initial Note (Signed)
Transition of Care [facility]) - Initial/Assessment Note    Patient Details  Name: Henry Ayala MRN: [id number] Date of Birth: 28-Aug-1931  [facility] CM/SW Contact:    Jacquelynn Cree Phone Number: 02/08/2020, 1:37 PM  Clinical Narrative:                 CSW received consult for possible SNF placement at time of discharge. SW met bedside with patient and patient's daughter [name]. Patient's daughter expressed understanding of PT recommendation and is in agreement. She expressed preference for [facility]). CSW explained insurance process and provided Medicare ratings to patient's daughter. No further questions expressed at this time. CSW will continue to follow and assist with discharge planning needs.   Expected Discharge Plan: Skilled Nursing Facility Barriers to Discharge: Continued Medical Work up   Patient Goals and CMS Choice   CMS Medicare.gov Compare Post Acute Care list provided to:: Patient Represenative (must comment)(Meredith, daughter) Choice offered to / list presented to : Adult Children  Expected Discharge Plan and Services Expected Discharge Plan: [place] arrangements for the past 2 months: Single Family Home                                      Prior Living Arrangements/Services Living arrangements for the past 2 months: Single Family Home Lives with:: Self Patient language and need for interpreter reviewed:: Yes Do you feel safe going back to the place where you live?: Yes      Need for Family Participation in Patient Care: Yes (Comment) Care giver support system in place?: Yes (comment)   Criminal Activity/Legal Involvement Pertinent to Current Situation/Hospitalization: No - Comment as needed  Activities of Daily Living Home Assistive Devices/Equipment: Eyeglasses ADL Screening (condition at time of admission) Patient's cognitive ability adequate to safely complete daily activities?:  Yes Is the patient deaf or have difficulty hearing?: Yes Does the patient have difficulty seeing, even when wearing glasses/contacts?: No Does the patient have difficulty concentrating, remembering, or making decisions?: Yes Patient able to express need for assistance with ADLs?: Yes Does the patient have difficulty dressing or bathing?: No Independently performs ADLs?: Yes (appropriate for developmental age) Does the patient have difficulty walking or climbing stairs?: Yes Weakness of Legs: Both Weakness of Arms/Hands: None  Permission Sought/Granted Permission sought to share information with : Facility Sport and exercise psychologist, Family Supports    Share Information with NAME: Melissa  Permission granted to share info w AGENCY: SNFS  Permission granted to share info w Relationship: Daughter  Permission granted to share info w Contact Information: [phone]  Emotional Assessment Appearance:: Appears older than stated age Attitude/Demeanor/Rapport: Unable to Assess Affect (typically observed): Unable to Assess Orientation: : Oriented to Self Alcohol / Substance Use: Not Applicable Psych Involvement: No (comment)  Admission diagnosis:  SOB (shortness of breath) [R06.02] Acute on chronic systolic CHF (congestive heart failure) ([place]) [I50.23] Patient Active Problem List   Diagnosis Date Noted  . Acute on chronic systolic CHF (congestive heart failure) ([employer]) 02/04/2020  . SOB (shortness of breath) 01/29/2020  . Sleep disturbance 01/29/2020  . Chronic systolic congestive heart failure ([place]) 09/05/2017  . Neoplasm of uncertain behavior of skin 01/25/2017  . Depression 07/19/2016  . CAD (coronary artery disease) 05/09/2016  . Diaphragmatic hernia without obstruction and without gangrene 05/09/2016  . Memory loss 04/17/2016  . Normocytic  anemia 03/12/2016  . Upper GI bleed 02/12/2016  . Benign essential HTN 02/12/2016  . Anticoagulant long-term use 02/12/2016  . Chronic atrial  fibrillation ([place]) 04/28/2014  . Inguinal hernia 04/28/2014  . Sleep apnea 10/26/2013  . Ischemic heart disease 04/27/2011  . Hypercholesterolemia 04/27/2011  . GERD (gastroesophageal reflux disease) 04/27/2011  . BPH (benign prostatic hyperplasia) 04/27/2011   PCP:  [name], DO Pharmacy:   [facility] [address], [place] [address] [place] [address] Phone: [phone] Fax: [phone], [place], [place] - [place] [address] Phone: [phone] Fax: [phone]     Social Determinants of Health (SDOH) Interventions    Readmission Risk Interventions No flowsheet data found.

## 2020-02-08 NOTE — Progress Notes (Signed)
Pt had 11 beats Vtach- pt asymptomatic- will continue to monitor

## 2020-02-08 NOTE — Progress Notes (Signed)
Pt pulling off heart monitor leads/ condom cath and trying to get out of bed out to "go various places" CONTINUOUSLY throughout the shift. I gave 1/2 (0.25mg ) of ordered Ativan dose in an effort to help him rest. Will continue to monitor patient.

## 2020-02-08 NOTE — Progress Notes (Signed)
Physical Therapy Treatment Patient Details Name: Henry Ayala MRN: [id number] DOB: 01/14/1931 Today's Date: 02/08/2020    History of Present Illness 86 y.o. male with medical history significant for atrial fibrillation on Eliquis, coronary artery disease, chronic systolic CHF, and large hiatal hernia, now presenting to the emergency department for evaluation of exertional dyspnea and right lower leg swelling.     PT Comments    Pt was seen for mobility on RW, with cues for safety and navigation of room, hall and setting limits.  He is getting elevation of HR with controlled sats, but is having PVC's during gait.  Informed nursing about his tolerance for mobility, and talked with pt and family about the ongoing plan to go to SNF.  Pt asked to return to bed, but had been OOB most of the day.  Follow acutely for progression of gait.  Follow Up Recommendations  SNF     Equipment Recommendations  None recommended by PT    Recommendations for Other Services       Precautions / Restrictions Precautions Precautions: Fall Precaution Comments: impulsive Restrictions Weight Bearing Restrictions: No    Mobility  Bed Mobility Overal bed mobility: Modified Independent             General bed mobility comments: supervised due to impulsivity  Transfers Overall transfer level: Needs assistance Equipment used: Rolling walker (2 wheeled) Transfers: Sit to/from Stand Sit to Stand: Min guard Stand pivot transfers: Min guard       General transfer comment: cues and prompts for safety  Ambulation/Gait Ambulation/Gait assistance: Min guard Gait Distance (Feet): 120 Feet Assistive device: Rolling walker (2 wheeled) Gait Pattern/deviations: Decreased stride length;Wide base of support Gait velocity: reduced Gait velocity interpretation: <1.31 ft/sec, indicative of household ambulator General Gait Details: able to navigate the hall with supervised help, but had increased HR to 122  with 2 PVC's   Stairs             Wheelchair Mobility    Modified Rankin (Stroke Patients Only)       Balance Overall balance assessment: Needs assistance Sitting-balance support: Feet supported Sitting balance-Leahy Scale: Good       Standing balance-Leahy Scale: Fair Standing balance comment: RW for dynamic stability                            Cognition Arousal/Alertness: Awake/alert Behavior During Therapy: Restless Overall Cognitive Status: Impaired/Different from baseline Area of Impairment: Memory;Safety/judgement;Problem solving;Attention                   Current Attention Level: Selective Memory: Decreased short-term memory;Decreased recall of precautions   Safety/Judgement: Decreased awareness of safety;Decreased awareness of deficits   Problem Solving: Slow processing;Decreased initiation General Comments: talked with daughter about presentation of pt during initiation of therapy      Exercises      General Comments        Pertinent Vitals/Pain Pain Assessment: No/denies pain Faces Pain Scale: No hurt    Home Living                      Prior Function            PT Goals (current goals can now be found in the care plan section) Progress towards PT goals: Progressing toward goals    Frequency    Min 2X/week      PT Plan Current plan remains appropriate  Co-evaluation              AM-PAC PT "6 Clicks" Mobility   Outcome Measure  Help needed turning from your back to your side while in a flat bed without using bedrails?: None Help needed moving from lying on your back to sitting on the side of a flat bed without using bedrails?: A Little Help needed moving to and from a bed to a chair (including a wheelchair)?: A Little Help needed standing up from a chair using your arms (e.g., wheelchair or bedside chair)?: A Little Help needed to walk in hospital room?: A Little Help needed climbing 3-5  steps with a railing? : A Lot 6 Click Score: 18    End of Session Equipment Utilized During Treatment: Gait belt Activity Tolerance: Patient tolerated treatment well Patient left: in bed;with call bell/phone within reach;with bed alarm set;with family/visitor present Nurse Communication: Mobility status PT Visit Diagnosis: Other abnormalities of gait and mobility (R26.89);Unsteadiness on feet (R26.81)     Time: [id number] PT Time Calculation (min) (ACUTE ONLY): 23 min  Charges:  $Gait Training: 8-22 mins $Therapeutic Activity: 8-22 mins                    Ramond Dial 02/08/2020, 9:35 PM  Mee Hives, PT MS Acute Rehab Dept. Number: [place] and [place]

## 2020-02-08 NOTE — NC FL2 (Signed)
MEDICAID FL2 LEVEL OF CARE SCREENING TOOL     IDENTIFICATION  Patient Name: Henry Ayala Birthdate: [birth year] Sex: male Admission Date (Current Location): 02/04/2020  [facility] and [place] Number:  [employer] and Address:  [facility] [place], [place] [address], [place], [place] [address]      Provider Number: [id number]  Attending Physician Name and Address:  Hoyt Koch, MD  Relative Name and Phone Number:  [name] [phone]    Current Level of Care: Hospital Recommended Level of Care: [place] Prior Approval Number:    Date Approved/Denied:   PASRR Number: [id number]  Discharge Plan: SNF    Current Diagnoses: Patient Active Problem List   Diagnosis Date Noted  . Acute on chronic systolic CHF (congestive heart failure) ([place]) 02/04/2020  . SOB (shortness of breath) 01/29/2020  . Sleep disturbance 01/29/2020  . Chronic systolic congestive heart failure ([place]) 09/05/2017  . Neoplasm of uncertain behavior of skin 01/25/2017  . Depression 07/19/2016  . CAD (coronary artery disease) 05/09/2016  . Diaphragmatic hernia without obstruction and without gangrene 05/09/2016  . Memory loss 04/17/2016  . Normocytic anemia 03/12/2016  . Upper GI bleed 02/12/2016  . Benign essential HTN 02/12/2016  . Anticoagulant long-term use 02/12/2016  . Chronic atrial fibrillation ([place]) 04/28/2014  . Inguinal hernia 04/28/2014  . Sleep apnea 10/26/2013  . Ischemic heart disease 04/27/2011  . Hypercholesterolemia 04/27/2011  . GERD (gastroesophageal reflux disease) 04/27/2011  . BPH (benign prostatic hyperplasia) 04/27/2011    Orientation RESPIRATION BLADDER Height & Weight     Self  Normal Incontinent, External catheter Weight: 142 lb 1.6 oz (64.5 kg) Height:  5\' 7"  (170.2 cm)  BEHAVIORAL SYMPTOMS/MOOD NEUROLOGICAL BOWEL NUTRITION STATUS      Continent Diet(See discharge summary)  AMBULATORY STATUS COMMUNICATION  OF NEEDS Skin   Limited Assist Verbally Normal                       Personal Care Assistance Level of Assistance  Bathing, Dressing, Feeding Bathing Assistance: Limited assistance Feeding assistance: Independent Dressing Assistance: Limited assistance     Functional Limitations Info  Sight, Hearing, Speech Sight Info: Adequate Hearing Info: Adequate Speech Info: Adequate    SPECIAL CARE FACTORS FREQUENCY  PT (By licensed PT), OT (By licensed OT)     PT Frequency: 5x a week OT Frequency: 5x a week            Contractures Contractures Info: Not present    Additional Factors Info  Code Status, Allergies Code Status Info: Full Allergies Info: Iodinated Diagnostic Agents; Lipitor (atorvastatin Calcium); Sulfa Drugs Cross Reactors           Current Medications (02/08/2020):  This is the current hospital active medication list Current Facility-Administered Medications  Medication Dose Route Frequency Provider Last Rate Last Admin  . 0.9 %  sodium chloride infusion  250 mL Intravenous PRN Opyd, Ilene Qua, MD      . acetaminophen (TYLENOL) tablet 650 mg  650 mg Oral Q4H PRN Opyd, Ilene Qua, MD      . apixaban (ELIQUIS) tablet 5 mg  5 mg Oral BID Opyd, Ilene Qua, MD   5 mg at 02/08/20 0945  . furosemide (LASIX) tablet 40 mg  40 mg Oral Daily Arnoldo Lenis, MD   40 mg at 02/08/20 0945  . metoprolol succinate (TOPROL-XL) 24 hr tablet 25 mg  25 mg Oral Daily Opyd, Ilene Qua, MD  25 mg at 02/08/20 0945  . ondansetron (ZOFRAN) injection 4 mg  4 mg Intravenous Q6H PRN Opyd, Ilene Qua, MD      . pantoprazole (PROTONIX) EC tablet 40 mg  40 mg Oral BID Opyd, Ilene Qua, MD   40 mg at 02/08/20 0945  . pravastatin (PRAVACHOL) tablet 40 mg  40 mg Oral q1800 Sande Rives E, PA-C   40 mg at 02/07/20 1612  . sodium chloride flush (NS) 0.9 % injection 3 mL  3 mL Intravenous Once Opyd, Ilene Qua, MD      . sodium chloride flush (NS) 0.9 % injection 3 mL  3 mL Intravenous Q12H  Opyd, Ilene Qua, MD   3 mL at 02/08/20 0945  . sodium chloride flush (NS) 0.9 % injection 3 mL  3 mL Intravenous PRN Opyd, Ilene Qua, MD      . spironolactone (ALDACTONE) tablet 25 mg  25 mg Oral Daily Opyd, Ilene Qua, MD   25 mg at 02/08/20 0945     Discharge Medications: Please see discharge summary for a list of discharge medications.  Relevant Imaging Results:  Relevant Lab Results:   Additional Information SSN [id number]  Arvella Merles, [place]

## 2020-02-09 DIAGNOSIS — F419 Anxiety disorder, unspecified: Secondary | ICD-10-CM | POA: Diagnosis not present

## 2020-02-09 DIAGNOSIS — Z7401 Bed confinement status: Secondary | ICD-10-CM | POA: Diagnosis not present

## 2020-02-09 DIAGNOSIS — I2510 Atherosclerotic heart disease of native coronary artery without angina pectoris: Secondary | ICD-10-CM | POA: Diagnosis not present

## 2020-02-09 DIAGNOSIS — R531 Weakness: Secondary | ICD-10-CM | POA: Diagnosis not present

## 2020-02-09 DIAGNOSIS — R0902 Hypoxemia: Secondary | ICD-10-CM | POA: Diagnosis not present

## 2020-02-09 DIAGNOSIS — F329 Major depressive disorder, single episode, unspecified: Secondary | ICD-10-CM | POA: Diagnosis not present

## 2020-02-09 DIAGNOSIS — N400 Enlarged prostate without lower urinary tract symptoms: Secondary | ICD-10-CM | POA: Diagnosis not present

## 2020-02-09 DIAGNOSIS — I5021 Acute systolic (congestive) heart failure: Secondary | ICD-10-CM | POA: Diagnosis not present

## 2020-02-09 DIAGNOSIS — K219 Gastro-esophageal reflux disease without esophagitis: Secondary | ICD-10-CM | POA: Diagnosis not present

## 2020-02-09 DIAGNOSIS — I110 Hypertensive heart disease with heart failure: Secondary | ICD-10-CM | POA: Diagnosis not present

## 2020-02-09 DIAGNOSIS — I509 Heart failure, unspecified: Secondary | ICD-10-CM | POA: Diagnosis not present

## 2020-02-09 DIAGNOSIS — G4730 Sleep apnea, unspecified: Secondary | ICD-10-CM | POA: Diagnosis not present

## 2020-02-09 DIAGNOSIS — K449 Diaphragmatic hernia without obstruction or gangrene: Secondary | ICD-10-CM | POA: Diagnosis not present

## 2020-02-09 DIAGNOSIS — Z7901 Long term (current) use of anticoagulants: Secondary | ICD-10-CM | POA: Diagnosis not present

## 2020-02-09 DIAGNOSIS — I482 Chronic atrial fibrillation, unspecified: Secondary | ICD-10-CM | POA: Diagnosis not present

## 2020-02-09 DIAGNOSIS — M6281 Muscle weakness (generalized): Secondary | ICD-10-CM | POA: Diagnosis not present

## 2020-02-09 DIAGNOSIS — R0602 Shortness of breath: Secondary | ICD-10-CM | POA: Diagnosis not present

## 2020-02-09 DIAGNOSIS — R5381 Other malaise: Secondary | ICD-10-CM | POA: Diagnosis not present

## 2020-02-09 DIAGNOSIS — R410 Disorientation, unspecified: Secondary | ICD-10-CM | POA: Diagnosis not present

## 2020-02-09 DIAGNOSIS — R41841 Cognitive communication deficit: Secondary | ICD-10-CM | POA: Diagnosis not present

## 2020-02-09 DIAGNOSIS — Z20828 Contact with and (suspected) exposure to other viral communicable diseases: Secondary | ICD-10-CM | POA: Diagnosis not present

## 2020-02-09 DIAGNOSIS — I5023 Acute on chronic systolic (congestive) heart failure: Secondary | ICD-10-CM | POA: Diagnosis not present

## 2020-02-09 DIAGNOSIS — I10 Essential (primary) hypertension: Secondary | ICD-10-CM | POA: Diagnosis not present

## 2020-02-09 DIAGNOSIS — M2550 Pain in unspecified joint: Secondary | ICD-10-CM | POA: Diagnosis not present

## 2020-02-09 DIAGNOSIS — I4891 Unspecified atrial fibrillation: Secondary | ICD-10-CM | POA: Diagnosis not present

## 2020-02-09 LAB — BASIC METABOLIC PANEL
Anion gap: 13 (ref 5–15)
BUN: 32 mg/dL — ABNORMAL HIGH (ref 8–23)
CO2: 35 mmol/L — ABNORMAL HIGH (ref 22–32)
Calcium: 9.4 mg/dL (ref 8.9–10.3)
Chloride: 93 mmol/L — ABNORMAL LOW (ref 98–111)
Creatinine, Ser: 1.35 mg/dL — ABNORMAL HIGH (ref 0.61–1.24)
GFR calc Af Amer: 55 mL/min — ABNORMAL LOW (ref >60)
GFR calc non Af Amer: 47 mL/min — ABNORMAL LOW (ref >60)
Glucose, Bld: 116 mg/dL — ABNORMAL HIGH (ref 70–99)
Potassium: 3.4 mmol/L — ABNORMAL LOW (ref 3.5–5.1)
Sodium: 141 mmol/L (ref 135–145)

## 2020-02-09 LAB — SARS CORONAVIRUS 2 (TAT 6-24 HRS): SARS Coronavirus 2: NEGATIVE

## 2020-02-09 MED ORDER — LORAZEPAM 0.5 MG PO TABS: 0.2500 mg | ORAL_TABLET | Freq: Every evening | ORAL | 0 refills | Status: AC | PRN

## 2020-02-09 MED ORDER — FUROSEMIDE 40 MG PO TABS: 40.0000 mg | ORAL_TABLET | Freq: Every day | ORAL | 0 refills | Status: AC

## 2020-02-09 NOTE — Progress Notes (Signed)
Occupational Therapy Treatment Patient Details Name: Henry Ayala MRN: [id number] DOB: [birth year] Today's Date: 02/09/2020    History of present illness 86 y.o. male with medical history significant for atrial fibrillation on Eliquis, coronary artery disease, chronic systolic CHF, and large hiatal hernia, now presenting to the emergency department for evaluation of exertional dyspnea and right lower leg swelling.    OT comments  Patient with decreased safety awareness with body mechanics and use of rolling walker during mobility and transfers. Limited carry over for directions regarding hand placement during transfers with mod to max verbal, visual cues. Patient min A for safety to perform sink side g/h and toilet transfer. Will continue to follow.   Follow Up Recommendations  SNF;Supervision/Assistance - 24 hour    Equipment Recommendations  Tub/shower seat       Precautions / Restrictions Precautions Precautions: Fall Restrictions Weight Bearing Restrictions: No       Mobility Bed Mobility Overal bed mobility: Needs Assistance Bed Mobility: Supine to Sit     Supine to sit: Min assist     General bed mobility comments: min A to elevate trunk to sitting, cues for sequencing  Transfers Overall transfer level: Needs assistance Equipment used: Rolling walker (2 wheeled) Transfers: Sit to/from Stand Sit to Stand: Min assist         General transfer comment: max cues for body mechanics with sit <> stand, poor carry over    Balance Overall balance assessment: Needs assistance Sitting-balance support: Feet supported Sitting balance-Leahy Scale: Good     Standing balance support: During functional activity;Bilateral upper extremity supported Standing balance-Leahy Scale: Poor Standing balance comment: RW for stability/safety                           ADL either performed or assessed with clinical judgement   ADL Overall ADL's : Needs  assistance/impaired     Grooming: Oral care;Wash/dry face;Wash/dry hands;Min guard;Standing Grooming Details (indicate cue type and reason): mod verbal cues to step closer to sink to perform g/h             Lower Body Dressing: Moderate assistance;Sitting/lateral leans Lower Body Dressing Details (indicate cue type and reason): pt attempt to don R sock however increased difficulty due to caught on toe nail  Toilet Transfer: Minimal assistance;Ambulation;RW;Grab bars;Regular Toilet;Cueing for Office manager Details (indicate cue type and reason): poor safety with body mechanics requiring cues         Functional mobility during ADLs: Minimal assistance;Rolling walker;Cueing for safety;Cueing for sequencing General ADL Comments: decreased safety awareness with body mechanics and use of walker running into environmental obstacles               Cognition Arousal/Alertness: Awake/alert Behavior During Therapy: WFL for tasks assessed/performed Overall Cognitive Status: Impaired/Different from baseline Area of Impairment: Following commands;Safety/judgement;Problem solving                       Following Commands: Follows one step commands with increased time Safety/Judgement: Decreased awareness of safety;Decreased awareness of deficits   Problem Solving: Slow processing;Decreased initiation;Requires verbal cues;Requires tactile cues                General Comments HR witnessed up to 117 with activity, typically between 95-105 standing sink side    Pertinent Vitals/ Pain       Pain Assessment: No/denies pain         Frequency  Min  2X/week        Progress Toward Goals  OT Goals(current goals can now be found in the care plan section)  Progress towards OT goals: Progressing toward goals  Acute Rehab OT Goals Patient Stated Goal: daughter wants ST rehab in SNF OT Goal Formulation: With patient/family Time For Goal Achievement:  02/20/20 Potential to Achieve Goals: Good ADL Goals Pt Will Perform Grooming: with supervision;standing Pt Will Perform Lower Body Bathing: with supervision;sit to/from stand Pt Will Perform Lower Body Dressing: with supervision;sit to/from stand Pt Will Transfer to Toilet: with supervision;ambulating;regular height toilet Pt Will Perform Toileting - Clothing Manipulation and hygiene: with supervision Additional ADL Goal #1: Pt will identify and gather items necessary for ADL around his room with supervision.  Plan Discharge plan remains appropriate       AM-PAC OT "6 Clicks" Daily Activity     Outcome Measure   Help from another person eating meals?: None Help from another person taking care of personal grooming?: A Little Help from another person toileting, which includes using toliet, bedpan, or urinal?: A Little Help from another person bathing (including washing, rinsing, drying)?: A Little Help from another person to put on and taking off regular upper body clothing?: None Help from another person to put on and taking off regular lower body clothing?: A Lot 6 Click Score: 19    End of Session Equipment Utilized During Treatment: Rolling walker  OT Visit Diagnosis: Unsteadiness on feet (R26.81);Other symptoms and signs involving cognitive function   Activity Tolerance Patient tolerated treatment well   Patient Left in chair;with call bell/phone within reach;with chair alarm set;with family/visitor present           Time: 1130-1146 OT Time Calculation (min): 16 min  Charges: OT General Charges $OT Visit: 1 Visit OT Treatments $Self Care/Home Management : 8-22 mins  [place] OT office: [place] 02/09/2020, 12:40 PM

## 2020-02-09 NOTE — Discharge Summary (Signed)
Physician Discharge Summary  Henry Ayala [id number] DOB: May 04, 1931 DOA: 02/04/2020  PCP: [name], DO  Admit date: 02/04/2020 Discharge date: 02/09/2020  Admitted From: home Disposition:  SNF  Recommendations for Outpatient Follow-up:  1. Follow up with PCP in 1-2 weeks after discharge from SNF 2. Please obtain BMP/CBC in one week 3. Please follow up on the following pending results:none  Home Health: none Equipment/Devices:none  Discharge Condition:stable CODE STATUS:full Diet recommendation: Heart Healthy   Brief/Interim Summary: Henry Weyand Websteris a 86 y.o.malewith medical history significant foratrial fibrillation on Eliquis, coronary artery disease, chronic systolic CHF, and large hiatal hernia, now presenting to the emergency department for evaluation of exertional dyspnea and right lower leg swelling. Patient has a part-time caregiver who assists with the history and reports that the patient has been appearing dyspneic with minimal exertion for the past couple days and she also noted swelling involving the lower right leg yesterday. Patient himself has not been complaining of anything. His caregiver reports that he has been developing dementia and has had more episodes of confusion recently. There was some concern that recent addition of Remeron might be contributing to his worsening confusion and this was stopped. His caregiver administers his medications once a day and then sets out his twice daily medications for the patient to take on his own, but she notes that he sometimes forgets to.  Hospital course: fluid diuresed well and once back on his medications his condition improved. With some sundowning and did require some ativan at night time to help him settle better. Daughter with him to help with reduction of confusion daytime. Some SOB thought to be related to deconditioning and large hiatal hernia present and improving throughout stay. Goal is to go  to SNF for some conditioning to return home with potentially hospice.   Discharge Diagnoses:  Principal Problem:   Acute on chronic systolic CHF (congestive heart failure) ([name]) Active Problems:   Chronic atrial fibrillation ([name])   CAD (coronary artery disease)   Diaphragmatic hernia without obstruction and without gangrene   SOB (shortness of breath)  Discharge Instructions  Allergies as of 02/09/2020      Reactions   Iodinated Diagnostic Agents    unknown   Lipitor [atorvastatin Calcium]    unknown   Sulfa Drugs Cross Reactors    unknown      Medication List    STOP taking these medications   alum & mag hydroxide-simeth 200-200-20 MG/5ML suspension Commonly known as: MAALOX/MYLANTA   bismuth subsalicylate [id number] suspension Commonly known as: PEPTO BISMOL   mirtazapine 15 MG tablet Commonly known as: REMERON     TAKE these medications   acetaminophen 500 MG tablet Commonly known as: TYLENOL Take 500 mg by mouth every 6 (six) hours as needed for mild pain or headache.   apixaban 5 MG Tabs tablet Commonly known as: Eliquis Take 1 tablet (5 mg total) by mouth 2 (two) times daily.   doxazosin 2 MG tablet Commonly known as: CARDURA Take 1 tablet (2 mg total) by mouth daily.   furosemide 40 MG tablet Commonly known as: LASIX Take 1 tablet (40 mg total) by mouth daily. Start taking on: February 10, 2020   LORazepam 0.5 MG tablet Commonly known as: Ativan Take 0.5 tablets (0.25 mg total) by mouth at bedtime as needed for anxiety (agitation).   lovastatin 40 MG tablet Commonly known as: MEVACOR Take 1 tablet (40 mg total) by mouth daily.   metoprolol succinate 25 MG  24 hr tablet Commonly known as: TOPROL-XL Take 1 tablet (25 mg total) by mouth daily.   multivitamin with minerals tablet Take 1 tablet by mouth daily.   pantoprazole 40 MG tablet Commonly known as: PROTONIX Take 1 tablet (40 mg total) by mouth 2 (two) times daily.   spironolactone 25 MG  tablet Commonly known as: Aldactone Take 1 tablet (25 mg total) by mouth daily.      Contact information for after-discharge care    Destination    [facility] .   Service: Skilled Nursing Contact information: [address] [place] [phone]             Allergies  Allergen Reactions  . Iodinated Diagnostic Agents     unknown  . Lipitor [Atorvastatin Calcium]     unknown  . Sulfa Drugs Cross Reactors     unknown    Consultations:  none  Procedures/Studies: DG Chest 2 View  Result Date: 01/28/2020 CLINICAL DATA:  86 year old male with shortness of breath and dizziness. EXAM: CHEST - 2 VIEW COMPARISON:  Chest radiographs 07/30/2017 and earlier. FINDINGS: Very large chronic bowel containing diaphragmatic hernia appears not significantly changed from 2018. The entire stomach might be intrathoracic, versus about 50% of the stomach in 2017. Stable cardiac size and mediastinal contours. Visualized tracheal air column is within normal limits. Abdominal Calcified aortic atherosclerosis. Stable lung volumes and ventilation. No pneumothorax, pleural effusion, pulmonary edema or consolidation identified. Chronic dextroconvex thoracolumbar junction scoliosis. No acute osseous abnormality identified. Negative visible intra-abdominal bowel gas pattern. IMPRESSION: 1.  No acute cardiopulmonary abnormality. 2. Very large chronic bowel containing diaphragmatic hernia appears stable since 2018. 3.  Aortic Atherosclerosis (ICD10-I70.0). Electronically Signed   By: Genevie Ann M.D.   On: 01/28/2020 13:02   CT Head Wo Contrast  Result Date: 02/04/2020 CLINICAL DATA:  Generalized weakness. EXAM: CT HEAD WITHOUT CONTRAST TECHNIQUE: Contiguous axial images were obtained from the base of the skull through the vertex without intravenous contrast. COMPARISON:  February 12, 2016 FINDINGS: Brain: There is mild cerebral atrophy with  widening of the extra-axial spaces and ventricular dilatation. There are areas of decreased attenuation within the white matter tracts of the supratentorial brain, consistent with microvascular disease changes. Vascular: No hyperdense vessel or unexpected calcification. Skull: Normal. Negative for fracture or focal lesion. Sinuses/Orbits: No acute finding. Other: None. IMPRESSION: 1. Generalized cerebral atrophy. 2. No acute intracranial abnormality. Electronically Signed   By: Virgina Norfolk M.D.   On: 02/04/2020 23:23   DG Chest Portable 1 View  Result Date: 02/04/2020 CLINICAL DATA:  86 year old male with shortness of breath EXAM: PORTABLE CHEST 1 VIEW COMPARISON:  Chest radiograph dated 01/28/2020. FINDINGS: Bilateral lower lung field streaky densities, likely atelectasis. Pneumonia is not excluded. Clinical correlation is recommended. No large pleural effusion or pneumothorax. Stable cardiomegaly. Atherosclerotic calcification of the aorta. Large hiatal hernia containing distended bowel as seen on the prior radiograph. Associated obstruction is not excluded on this radiograph. IMPRESSION: 1. Bibasilar atelectasis versus infiltrate. 2. Large hiatal hernia containing distended bowel similar to prior radiograph. Electronically Signed   By: Anner Crete M.D.   On: 02/04/2020 20:43   ECHOCARDIOGRAM COMPLETE  Result Date: 02/05/2020    ECHOCARDIOGRAM REPORT   Patient Name:   Henry Ayala Date of Exam: 02/05/2020 Medical Rec #:  [id number]        Height:       67.0 in Accession #:  [id number]       Weight:       160.0 lb Date of Birth:  1931-10-31        BSA:          1.839 m Patient Age:    37 years         BP:           103/71 mmHg Patient Gender: M                HR:           80 bpm. Exam Location:  Inpatient Procedure: 2D Echo, 3D Echo, Color Doppler, Cardiac Doppler and Strain Analysis Indications:    [id number] Acute systolic (congestive) heart failure  History:        Patient has prior history  of Echocardiogram examinations, most                 recent 08/15/2017. CHF, CAD, Arrythmias:Atrial Fibrillation; Risk                 Factors:Dyslipidemia, Hypertension and Sleep Apnea.  Sonographer:    Raquel Sarna Senior RDCS Referring Phys: [id number] [place]  1. Left ventricular ejection fraction, by estimation, is 15-20%. The left ventricle has severely decreased function. The left ventricle demonstrates global hypokinesis. The left ventricular internal cavity size was severely dilated. Left ventricular diastolic function could not be evaluated.  2. Right ventricular systolic function is low normal. The right ventricular size is normal. There is mildly elevated pulmonary artery systolic pressure. The estimated right ventricular systolic pressure is [id number] mmHg.  3. Left atrial size was severely dilated.  4. The mitral valve is grossly normal. Mild to moderate mitral valve regurgitation.  5. The aortic valve is tricuspid. Aortic valve regurgitation is mild. No aortic stenosis is present.  6. There is mild dilatation of the ascending aorta measuring 41 mm.  7. The inferior vena cava is normal in size with <50% respiratory variability, suggesting right atrial pressure of 8 mmHg. Comparison(s): A prior study was performed on 08/15/2017. Prior images unable to be directly viewed, comparison made by report only. Changes from prior study are noted. EF now 15-20%. FINDINGS  Left Ventricle: Left ventricular ejection fraction, by estimation, is 15-20%. The left ventricle has severely decreased function. The left ventricle demonstrates global hypokinesis. The left ventricular internal cavity size was severely dilated. There is no left ventricular hypertrophy. Abnormal (paradoxical) septal motion, consistent with left bundle branch block. Left ventricular diastolic function could not be evaluated due to atrial fibrillation. Left ventricular diastolic function could not be evaluated. Right Ventricle: The right  ventricular size is normal. No increase in right ventricular wall thickness. Right ventricular systolic function is low normal. There is mildly elevated pulmonary artery systolic pressure. The tricuspid regurgitant velocity is 2.88 m/s, and with an assumed right atrial pressure of 8 mmHg, the estimated right ventricular systolic pressure is [id number] mmHg. Left Atrium: Left atrial size was severely dilated. Right Atrium: Right atrial size was normal in size. Pericardium: Trivial pericardial effusion is present. Presence of pericardial fat pad. Mitral Valve: The mitral valve is grossly normal. Mild to moderate mitral valve regurgitation. Tricuspid Valve: The tricuspid valve is grossly normal. Tricuspid valve regurgitation is mild . No evidence of tricuspid stenosis. Aortic Valve: The aortic valve is tricuspid. Aortic valve regurgitation is mild. Aortic regurgitation PHT measures 1089 msec. No aortic stenosis is present. Pulmonic Valve: The pulmonic valve was grossly normal. Pulmonic valve regurgitation is trivial.  No evidence of pulmonic stenosis. Aorta: The aortic root is normal in size and structure. There is mild dilatation of the ascending aorta measuring 41 mm. Venous: The inferior vena cava is normal in size with less than 50% respiratory variability, suggesting right atrial pressure of 8 mmHg. IAS/Shunts: No atrial level shunt detected by color flow Doppler.  LEFT VENTRICLE PLAX 2D LVIDd:         7.00 cm LVIDs:         6.40 cm LV PW:         0.80 cm LV IVS:        0.90 cm      3D Volume EF LVOT diam:     2.10 cm      LV 3D EF:    19.00 % LV SV:         35 LV SV Index:   19           3D Volume EF: LVOT Area:     3.46 cm     3D EF:        19 %  LV Volumes (MOD) LV vol d, MOD A2C: 230.0 ml LV vol d, MOD A4C: 192.0 ml LV vol s, MOD A2C: 180.0 ml LV vol s, MOD A4C: 154.0 ml LV SV MOD A2C:     50.0 ml LV SV MOD A4C:     192.0 ml LV SV MOD BP:      44.2 ml RIGHT VENTRICLE RV S prime:     10.60 cm/s TAPSE (M-mode): 1.4 cm  LEFT ATRIUM              Index       RIGHT ATRIUM           Index LA diam:        5.20 cm  2.83 cm/m  RA Area:     20.90 cm LA Vol (A2C):   130.0 ml 70.68 ml/m RA Volume:   63.00 ml  34.25 ml/m LA Vol (A4C):   102.0 ml 55.46 ml/m LA Biplane Vol: 117.0 ml 63.61 ml/m  AORTIC VALVE LVOT Vmax:   71.80 cm/s LVOT Vmean:  44.433 cm/s LVOT VTI:    0.101 m AI PHT:      1089 msec  AORTA Ao Root diam: 3.10 cm Ao Asc diam:  4.10 cm TRICUSPID VALVE TR Peak grad:   33.2 mmHg TR Vmax:        288.00 cm/s  SHUNTS Systemic VTI:  0.10 m Systemic Diam: 2.10 cm Eleonore Chiquito MD Electronically signed by Eleonore Chiquito MD Signature Date/Time: 02/05/2020/1:25:06 PM    Final     Subjective: Sleeping initially, with some sundowning last night. Daughter in room and explains recent history with her father. No complaints.   Discharge Exam: Vitals:   02/09/20 0329 02/09/20 0956  BP: (!) 109/92 106/66  Pulse: 84 85  Resp: 20   Temp: 98.2 F (36.8 C)   SpO2:     Vitals:   02/08/20 1537 02/08/20 2026 02/09/20 0329 02/09/20 0956  BP:  105/86 (!) 109/92 106/66  Pulse: 64 84 84 85  Resp: 12 20 20    Temp:  98.2 F (36.8 C) 98.2 F (36.8 C)   TempSrc:  Oral Oral   SpO2: 95%     Weight:   63.2 kg   Height:       General: Pt is alert, awake, not in acute distress Cardiovascular: RRR, S1/S2 +, no rubs, no gallops Respiratory:  CTA bilaterally, no wheezing, no rhonchi Abdominal: Soft, NT, ND, bowel sounds + Extremities: no edema, no cyanosis  The results of significant diagnostics from this hospitalization (including imaging, microbiology, ancillary and laboratory) are listed below for reference.    Microbiology: Recent Results (from the past 240 hour(s))  SARS CORONAVIRUS 2 (TAT 6-24 HRS) Nasopharyngeal Nasopharyngeal Swab     Status: None   Collection Time: 02/05/20 12:42 AM   Specimen: Nasopharyngeal Swab  Result Value Ref Range Status   SARS Coronavirus 2 NEGATIVE NEGATIVE Final    Comment:  (NOTE) SARS-CoV-2 target nucleic acids are NOT DETECTED. The SARS-CoV-2 RNA is generally detectable in upper and lower respiratory specimens during the acute phase of infection. Negative results do not preclude SARS-CoV-2 infection, do not rule out co-infections with other pathogens, and should not be used as the sole basis for treatment or other patient management decisions. Negative results must be combined with clinical observations, patient history, and epidemiological information. The expected result is Negative. Fact Sheet for Patients: SugarRoll.be Fact Sheet for Healthcare Providers: https://www.woods-mathews.com/ This test is not yet approved or cleared by the [place] FDA and  has been authorized for detection and/or diagnosis of SARS-CoV-2 by FDA under an Emergency Use Authorization (EUA). This EUA will remain  in effect (meaning this test can be used) for the duration of the COVID-19 declaration under Section 56 4(b)(1) of the Act, 21 U.S.C. section 360bbb-3(b)(1), unless the authorization is terminated or revoked sooner. Performed at [facility], [place] [address], [place], [place] [address]   SARS CORONAVIRUS 2 (TAT 6-24 HRS) Nasopharyngeal Nasopharyngeal Swab     Status: None   Collection Time: 02/09/20  5:01 AM   Specimen: Nasopharyngeal Swab  Result Value Ref Range Status   SARS Coronavirus 2 NEGATIVE NEGATIVE Final    Comment: (NOTE) SARS-CoV-2 target nucleic acids are NOT DETECTED. The SARS-CoV-2 RNA is generally detectable in upper and lower respiratory specimens during the acute phase of infection. Negative results do not preclude SARS-CoV-2 infection, do not rule out co-infections with other pathogens, and should not be used as the sole basis for treatment or other patient management decisions. Negative results must be combined with clinical observations, patient history, and epidemiological information. The  expected result is Negative. Fact Sheet for Patients: SugarRoll.be Fact Sheet for Healthcare Providers: https://www.woods-mathews.com/ This test is not yet approved or cleared by the [place] FDA and  has been authorized for detection and/or diagnosis of SARS-CoV-2 by FDA under an Emergency Use Authorization (EUA). This EUA will remain  in effect (meaning this test can be used) for the duration of the COVID-19 declaration under Section 56 4(b)(1) of the Act, 21 U.S.C. section 360bbb-3(b)(1), unless the authorization is terminated or revoked sooner. Performed at [facility], [place] [address] [place], [place] [address]      Labs: BNP (last 3 results) Recent Labs    02/04/20 2050  BNP 123XX123*   Basic Metabolic Panel: Recent Labs  Lab 02/05/20 1609 02/06/20 0441 02/07/20 0829 02/08/20 0335 02/09/20 0405  NA 140 142 142 140 141  K 4.0 4.1 4.2 3.6 3.4*  CL 101 99 99 92* 93*  CO2 29 32 32 34* 35*  GLUCOSE 144* 116* 93 127* 116*  BUN 22 22 28* 29* 32*  CREATININE 1.21 1.25* 1.17 1.35* 1.35*  CALCIUM 9.2 9.4 9.4 9.5 9.4  MG  --  1.8 1.9 1.8  --   PHOS 3.8 3.7  --   --   --  Liver Function Tests: Recent Labs  Lab 02/05/20 1609 02/06/20 0441  ALBUMIN 3.3* 3.4*   No results for input(s): LIPASE, AMYLASE in the last 168 hours. No results for input(s): AMMONIA in the last 168 hours. CBC: Recent Labs  Lab 02/04/20 1804 02/06/20 0441 02/08/20 0335  WBC 7.2 8.2 14.9*  NEUTROABS  --  6.1 11.7*  HGB 15.5 16.5 17.9*  HCT 47.6 50.0 53.6*  MCV 101.5* 101.4* 99.6  PLT 170 180 227   Cardiac Enzymes: No results for input(s): CKTOTAL, CKMB, CKMBINDEX, TROPONINI in the last 168 hours. BNP: Invalid input(s): POCBNP CBG: No results for input(s): GLUCAP in the last 168 hours. D-Dimer No results for input(s): DDIMER in the last 72 hours. Hgb A1c No results for input(s): HGBA1C in the last 72 hours. Lipid Profile No  results for input(s): CHOL, HDL, LDLCALC, TRIG, CHOLHDL, LDLDIRECT in the last 72 hours. Thyroid function studies No results for input(s): TSH, T4TOTAL, T3FREE, THYROIDAB in the last 72 hours.  Invalid input(s): FREET3 Anemia work up No results for input(s): VITAMINB12, FOLATE, FERRITIN, TIBC, IRON, RETICCTPCT in the last 72 hours. Urinalysis    Component Value Date/Time   COLORURINE YELLOW 02/04/2020 2115   APPEARANCEUR CLEAR 02/04/2020 2115   LABSPEC 1.023 02/04/2020 2115   PHURINE 5.0 02/04/2020 2115   GLUCOSEU NEGATIVE 02/04/2020 2115   HGBUR SMALL (A) 02/04/2020 2115   BILIRUBINUR NEGATIVE 02/04/2020 2115   KETONESUR 5 (A) 02/04/2020 2115   PROTEINUR 30 (A) 02/04/2020 2115   NITRITE NEGATIVE 02/04/2020 2115   LEUKOCYTESUR NEGATIVE 02/04/2020 2115   Sepsis Labs Invalid input(s): PROCALCITONIN,  WBC,  LACTICIDVEN Microbiology Recent Results (from the past 240 hour(s))  SARS CORONAVIRUS 2 (TAT 6-24 HRS) Nasopharyngeal Nasopharyngeal Swab     Status: None   Collection Time: 02/05/20 12:42 AM   Specimen: Nasopharyngeal Swab  Result Value Ref Range Status   SARS Coronavirus 2 NEGATIVE NEGATIVE Final    Comment: (NOTE) SARS-CoV-2 target nucleic acids are NOT DETECTED. The SARS-CoV-2 RNA is generally detectable in upper and lower respiratory specimens during the acute phase of infection. Negative results do not preclude SARS-CoV-2 infection, do not rule out co-infections with other pathogens, and should not be used as the sole basis for treatment or other patient management decisions. Negative results must be combined with clinical observations, patient history, and epidemiological information. The expected result is Negative. Fact Sheet for Patients: SugarRoll.be Fact Sheet for Healthcare Providers: https://www.woods-mathews.com/ This test is not yet approved or cleared by the [place] FDA and  has been authorized for detection  and/or diagnosis of SARS-CoV-2 by FDA under an Emergency Use Authorization (EUA). This EUA will remain  in effect (meaning this test can be used) for the duration of the COVID-19 declaration under Section 56 4(b)(1) of the Act, 21 U.S.C. section 360bbb-3(b)(1), unless the authorization is terminated or revoked sooner. Performed at [facility], [place] [address] [place], [place] [address]   SARS CORONAVIRUS 2 (TAT 6-24 HRS) Nasopharyngeal Nasopharyngeal Swab     Status: None   Collection Time: 02/09/20  5:01 AM   Specimen: Nasopharyngeal Swab  Result Value Ref Range Status   SARS Coronavirus 2 NEGATIVE NEGATIVE Final    Comment: (NOTE) SARS-CoV-2 target nucleic acids are NOT DETECTED. The SARS-CoV-2 RNA is generally detectable in upper and lower respiratory specimens during the acute phase of infection. Negative results do not preclude SARS-CoV-2 infection, do not rule out co-infections with other pathogens, and should not be used as the  sole basis for treatment or other patient management decisions. Negative results must be combined with clinical observations, patient history, and epidemiological information. The expected result is Negative. Fact Sheet for Patients: SugarRoll.be Fact Sheet for Healthcare Providers: https://www.woods-mathews.com/ This test is not yet approved or cleared by the [place] FDA and  has been authorized for detection and/or diagnosis of SARS-CoV-2 by FDA under an Emergency Use Authorization (EUA). This EUA will remain  in effect (meaning this test can be used) for the duration of the COVID-19 declaration under Section 56 4(b)(1) of the Act, 21 U.S.C. section 360bbb-3(b)(1), unless the authorization is terminated or revoked sooner. Performed at [facility], [place] [address] [place], [place] [address]     Time coordinating discharge: Over 30 minutes  SIGNED:  Hoyt Koch, MD  [facility] 02/09/2020, 12:40 PM Pager   If 7PM-7AM, please contact night-coverage [email or web address] Password [email or web address]

## 2020-02-09 NOTE — TOC Transition Note (Signed)
Transition of Care [facility]) - CM/SW Discharge Note   Patient Details  Name: Henry Ayala MRN: [id number] Date of Birth: 1931-08-16  Transition of Care [facility]) CM/SW Contact:  Gabrielle Dare Phone Number: 02/09/2020, 12:50 PM   Clinical Narrative:     Patient will Discharge To: [facility]) Anticipated DC Date:02/09/20 Family Notified:yes, daughter, Griffith Citron, [phone] Transport By: Corey Harold   Per MD patient ready for DC to [facility] . RN, patient, patient's family, and facility notified of DC. Assessment, Fl2/Pasrr, and Discharge Summary sent to facility. RN given number for report [phone], Room # 36). DC packet on chart. Ambulance transport requested for patient for 2:30pm  CSW signing off.  Reed Breech [facility] [phone]    Final next level of care: Skilled Nursing Facility Barriers to Discharge: No Barriers Identified   Patient Goals and CMS Choice   CMS Medicare.gov Compare Post Acute Care list provided to:: Patient Represenative (must comment)(Meredith, daughter) Choice offered to / list presented to : Adult Children  Discharge Placement              Patient chooses bed at: ([facility]) Patient to be transferred to facility by: [facility] Name of family member notified: Griffith Citron Patient and family notified of of transfer: 02/09/20  Discharge Plan and Services                                     Social Determinants of Health (SDOH) Interventions     Readmission Risk Interventions No flowsheet data found.

## 2020-02-09 NOTE — Progress Notes (Signed)
Patient had 12 beats VTach, patient asymptomatic and resting in bed. Will continue to monitor.

## 2020-02-11 DIAGNOSIS — I4891 Unspecified atrial fibrillation: Secondary | ICD-10-CM | POA: Diagnosis not present

## 2020-02-11 DIAGNOSIS — R410 Disorientation, unspecified: Secondary | ICD-10-CM | POA: Diagnosis not present

## 2020-02-11 DIAGNOSIS — R5381 Other malaise: Secondary | ICD-10-CM | POA: Diagnosis not present

## 2020-02-11 DIAGNOSIS — I5021 Acute systolic (congestive) heart failure: Secondary | ICD-10-CM | POA: Diagnosis not present

## 2020-02-17 DIAGNOSIS — R410 Disorientation, unspecified: Secondary | ICD-10-CM | POA: Diagnosis not present

## 2020-02-17 DIAGNOSIS — I5021 Acute systolic (congestive) heart failure: Secondary | ICD-10-CM | POA: Diagnosis not present

## 2020-02-17 DIAGNOSIS — R5381 Other malaise: Secondary | ICD-10-CM | POA: Diagnosis not present

## 2020-02-17 DIAGNOSIS — I4891 Unspecified atrial fibrillation: Secondary | ICD-10-CM | POA: Diagnosis not present

## 2020-02-18 ENCOUNTER — Other Ambulatory Visit: Payer: Self-pay | Admitting: *Deleted

## 2020-02-18 NOTE — Patient Outreach (Signed)
Late entry for 02/17/20  Screened for potential [facility] Care Management needs as a benefit of  [employer].  Mr. Fithen is currently receiving skilled therapy at [facility].  Spoke with [facility] RN after she attended telephonic interdisciplinary team meeting with Black.  Disposition plans are pending.  Will continue to follow while member resides in SNF.   Marthenia Rolling, MSN-Ed, RN,BSN [place] Acute Care Coordinator [phone] [facility]) [phone]  (Toll free office)

## 2020-02-20 ENCOUNTER — Encounter: Payer: Self-pay | Admitting: Family Medicine

## 2020-02-22 ENCOUNTER — Ambulatory Visit: Payer: Medicare Other | Admitting: Family Medicine

## 2020-02-26 ENCOUNTER — Encounter: Payer: Self-pay | Admitting: Family Medicine

## 2020-03-01 ENCOUNTER — Other Ambulatory Visit: Payer: Self-pay | Admitting: *Deleted

## 2020-03-01 NOTE — Patient Outreach (Signed)
Screened for potential [facility] Care Management needs as a benefit of  [employer].  Mr. Donsbach currently receiving skilled therapy at [facility].   Writer attended telephonic interdisciplinary team meeting to assess for disposition needs and transition plan for resident.   Facility reports member will transition to [facility] on next Monday 03/07/20.    Marthenia Rolling, MSN-Ed, RN,BSN [place] Acute Care Coordinator [phone] [facility]) [phone]  (Toll free office)

## 2020-03-02 DIAGNOSIS — I4891 Unspecified atrial fibrillation: Secondary | ICD-10-CM | POA: Diagnosis not present

## 2020-03-02 DIAGNOSIS — R5381 Other malaise: Secondary | ICD-10-CM | POA: Diagnosis not present

## 2020-03-02 DIAGNOSIS — I509 Heart failure, unspecified: Secondary | ICD-10-CM | POA: Diagnosis not present

## 2020-03-02 DIAGNOSIS — I10 Essential (primary) hypertension: Secondary | ICD-10-CM | POA: Diagnosis not present

## 2020-03-07 ENCOUNTER — Telehealth: Payer: Self-pay | Admitting: *Deleted

## 2020-03-07 DIAGNOSIS — I4891 Unspecified atrial fibrillation: Secondary | ICD-10-CM | POA: Diagnosis not present

## 2020-03-07 DIAGNOSIS — I10 Essential (primary) hypertension: Secondary | ICD-10-CM | POA: Diagnosis not present

## 2020-03-07 DIAGNOSIS — R5381 Other malaise: Secondary | ICD-10-CM | POA: Diagnosis not present

## 2020-03-07 DIAGNOSIS — I509 Heart failure, unspecified: Secondary | ICD-10-CM | POA: Diagnosis not present

## 2020-03-07 NOTE — Telephone Encounter (Signed)
TRANSITIONAL CARE MANAGEMENT TELEPHONE OUTREACH NOTE   Contact Date: 03/07/2020 Contacted By: Zannie Cove, lpn    DISCHARGE INFORMATION Date of Discharge:03/07/20  Discharge Facility: [facility] - skilled  Principal Discharge Diagnosis:CHF    Outpatient Follow Up Recommendations (copied from discharge summary) Unknown _ discharged from skill care   Henry Ayala is a male primary care patient of [name], DO. An outgoing telephone call was made today and I spoke with Vella Raring, his son.  Mr. Character condition(s) and treatment(s) were discussed. An opportunity to ask questions was provided and all were answered or forwarded as appropriate.    ACTIVITIES OF DAILY LIVING  Henry Ayala lives in an assisted living facility and he cannot perform ADLs independently. his primary caregiver is Celina and his son. he is able to depend on his primary caregiver(s) for consistent help. Transportation to appointments, to pick up medications, and to run errands is not a problem.  (Consider referral to [facility] if transportation or a consistent caregiver is a problem)   Fall Risk Fall Risk  01/28/2020 09/15/2019  Falls in the past year? 0 1  Number falls in past yr: - 0  Injury with Fall? - 0    medium Rachel Modifications/Assistive Devices Wheelchair: No Cane: No Ramp: No Bedside Toilet: No Hospital Bed:  No Other: walker and [place] he is not receiving home health n/a - in assisted living as of today services.     MEDICATION RECONCILIATION  Mr. Osegueda has been able to pick-up all prescribed discharge medications from the pharmacy.   A post discharge medication reconciliation was performed and the complete medication list was reviewed with the patient/caregiver and is current as of 03/07/2020. Changes highlighted below.  Discontinued Medications None that we know of ( NP to bring in papers to  OV-TOC)  Current Medication List Allergies as of 03/07/2020      Reactions   Iodinated Diagnostic Agents    unknown   Lipitor [atorvastatin Calcium]    unknown   Sulfa Drugs Cross Reactors    unknown      Medication List       Accurate as of March 07, 2020  3:51 PM. If you have any questions, ask your nurse or doctor.        acetaminophen 500 MG tablet Commonly known as: TYLENOL Take 500 mg by mouth every 6 (six) hours as needed for mild pain or headache.   apixaban 5 MG Tabs tablet Commonly known as: Eliquis Take 1 tablet (5 mg total) by mouth 2 (two) times daily.   doxazosin 2 MG tablet Commonly known as: CARDURA Take 1 tablet (2 mg total) by mouth daily.   furosemide 40 MG tablet Commonly known as: LASIX Take 1 tablet (40 mg total) by mouth daily.   LORazepam 0.5 MG tablet Commonly known as: Ativan Take 0.5 tablets (0.25 mg total) by mouth at bedtime as needed for anxiety (agitation).   lovastatin 40 MG tablet Commonly known as: MEVACOR Take 1 tablet (40 mg total) by mouth daily.   metoprolol succinate 25 MG 24 hr tablet Commonly known as: TOPROL-XL Take 1 tablet (25 mg total) by mouth daily.   multivitamin with minerals tablet Take 1 tablet by mouth daily.   pantoprazole 40 MG tablet Commonly known as: PROTONIX Take 1 tablet (40 mg total) by mouth 2 (two) times daily.   spironolactone  25 MG tablet Commonly known as: Aldactone Take 1 tablet (25 mg total) by mouth daily.        PATIENT EDUCATION & FOLLOW-UP PLAN  An appointment for Transitional Care Management is scheduled with [name], DO on 03/18/20 at 300pm.  Take all medications as prescribed  Contact our office by calling [phone] if you have any questions or concerns

## 2020-03-07 NOTE — Telephone Encounter (Signed)
    Transitional Care Management  Contact Attempt Attempt Date:03/07/2020 Attempted By: Zannie Cove, LPN   1st unsuccessful TCM contact attempt.   I reached out to Henry Ayala on his preferred telephone number to discuss Transitional Care Management, medication reconciliation, and to schedule a TCM hospital follow-up with his PCP at [facility].  Discharge Date: 03/07/20 Location: [facility] - Skilled care   Discharge Dx: Acute on chronic systolic CHF (congestive heart failure)  Recommendations for Outpatient Follow-up:  Follow up with PCP in 1-2 weeks after discharge from SNF  Plan I left a HIPPA compliant message for him to return my call.  Will attempt to contact again within the 2 business day post discharge window if he does not return my call.

## 2020-03-08 ENCOUNTER — Encounter: Payer: Self-pay | Admitting: Family Medicine

## 2020-03-08 ENCOUNTER — Telehealth: Payer: Self-pay

## 2020-03-08 DIAGNOSIS — I5023 Acute on chronic systolic (congestive) heart failure: Secondary | ICD-10-CM | POA: Diagnosis not present

## 2020-03-08 NOTE — Telephone Encounter (Signed)
  Patient Consent for Virtual Visit         Henry Ayala has provided verbal consent on 03/08/2020 for a virtual visit (video or telephone).   CONSENT FOR VIRTUAL VISIT FOR:  Henry Ayala  By participating in this virtual visit I agree to the following:  I hereby voluntarily request, consent and authorize [place] and its employed or contracted physicians, physician assistants, nurse practitioners or other licensed health care professionals (the Practitioner), to provide me with telemedicine health care services (the "Services") as deemed necessary by the treating Practitioner. I acknowledge and consent to receive the Services by the Practitioner via telemedicine. I understand that the telemedicine visit will involve communicating with the Practitioner through live audiovisual communication technology and the disclosure of certain medical information by electronic transmission. I acknowledge that I have been given the opportunity to request an in-person assessment or other available alternative prior to the telemedicine visit and am voluntarily participating in the telemedicine visit.  I understand that I have the right to withhold or withdraw my consent to the use of telemedicine in the course of my care at any time, without affecting my right to future care or treatment, and that the Practitioner or I may terminate the telemedicine visit at any time. I understand that I have the right to inspect all information obtained and/or recorded in the course of the telemedicine visit and may receive copies of available information for a reasonable fee.  I understand that some of the potential risks of receiving the Services via telemedicine include:  [name] Delay or interruption in medical evaluation due to technological equipment failure or disruption; . Information transmitted may not be sufficient (e.g. poor resolution of images) to allow for appropriate medical decision making by the  Practitioner; and/or  . In rare instances, security protocols could fail, causing a breach of personal health information.  Furthermore, I acknowledge that it is my responsibility to provide information about my medical history, conditions and care that is complete and accurate to the best of my ability. I acknowledge that Practitioner's advice, recommendations, and/or decision may be based on factors not within their control, such as incomplete or inaccurate data provided by me or distortions of diagnostic images or specimens that may result from electronic transmissions. I understand that the practice of medicine is not an exact science and that Practitioner makes no warranties or guarantees regarding treatment outcomes. I acknowledge that a copy of this consent can be made available to me via my patient portal ([place]), or I can request a printed copy by calling the office of [place].    I understand that my insurance will be billed for this visit.   I have read or had this consent read to me. . I understand the contents of this consent, which adequately explains the benefits and risks of the Services being provided via telemedicine.  . I have been provided ample opportunity to ask questions regarding this consent and the Services and have had my questions answered to my satisfaction. . I give my informed consent for the services to be provided through the use of telemedicine in my medical care

## 2020-03-11 ENCOUNTER — Telehealth (INDEPENDENT_AMBULATORY_CARE_PROVIDER_SITE_OTHER): Payer: Medicare Other | Admitting: Family Medicine

## 2020-03-11 ENCOUNTER — Encounter: Payer: Self-pay | Admitting: Family Medicine

## 2020-03-11 DIAGNOSIS — I5022 Chronic systolic (congestive) heart failure: Secondary | ICD-10-CM

## 2020-03-11 DIAGNOSIS — R531 Weakness: Secondary | ICD-10-CM | POA: Diagnosis not present

## 2020-03-11 NOTE — Progress Notes (Signed)
Virtual Visit via Video note  I connected with Henry Ayala on 03/11/20 at 11:02 AM by video and verified that I am speaking with the correct person using two identifiers. Henry Ayala is currently located at home Henry Ayala) and the [employer] is currently with him during visit. The provider, Henry Brooklyn, FNP is located in their home at time of visit.  I discussed the limitations, risks, security and privacy concerns of performing an evaluation and management service by video and the availability of in person appointments. I also discussed with the patient that there may be a patient responsible charge related to this service. The patient expressed understanding and agreed to proceed.  Subjective: PCP: Henry Norlander, DO  Chief Complaint  Patient presents with  . Extremity Weakness   Patient is in need of home health for nursing and physical therapy. He was recently transferred from skilled nursing to assisted living. He has CHF which has resulted in shortness of breath and generalized weakness. He is currently walking with a walker. Physical therapy needed for evaluation and treatment as we want to keep patient as independent as possible and prevent falls. Nursing needed to help with CHF.    ROS: Per HPI  Current Outpatient Medications:  .  acetaminophen (TYLENOL) 500 MG tablet, Take 500 mg by mouth every 6 (six) hours as needed for mild pain or headache., Disp: , Rfl:  .  apixaban (ELIQUIS) 5 MG TABS tablet, Take 1 tablet (5 mg total) by mouth 2 (two) times daily., Disp: 180 tablet, Rfl: 1 .  doxazosin (CARDURA) 2 MG tablet, Take 1 tablet (2 mg total) by mouth daily., Disp: 90 tablet, Rfl: 1 .  furosemide (LASIX) 40 MG tablet, Take 1 tablet (40 mg total) by mouth daily., Disp: 30 tablet, Rfl: 0 .  LORazepam (ATIVAN) 0.5 MG tablet, Take 0.5 tablets (0.25 mg total) by mouth at bedtime as needed for anxiety (agitation)., Disp: 30 tablet, Rfl: 0 .  lovastatin  (MEVACOR) 40 MG tablet, Take 1 tablet (40 mg total) by mouth daily., Disp: 90 tablet, Rfl: 1 .  metoprolol succinate (TOPROL-XL) 25 MG 24 hr tablet, Take 1 tablet (25 mg total) by mouth daily., Disp: 90 tablet, Rfl: 3 .  Multiple Vitamins-Minerals (MULTIVITAMIN WITH MINERALS) tablet, Take 1 tablet by mouth daily., Disp: , Rfl:  .  pantoprazole (PROTONIX) 40 MG tablet, Take 1 tablet (40 mg total) by mouth 2 (two) times daily., Disp: 180 tablet, Rfl: 1 .  spironolactone (ALDACTONE) 25 MG tablet, Take 1 tablet (25 mg total) by mouth daily., Disp: 90 tablet, Rfl: 1  Allergies  Allergen Reactions  . Iodinated Diagnostic Agents     unknown  . Lipitor [Atorvastatin Calcium]     unknown  . Sulfa Drugs Cross Reactors     unknown   Past Medical History:  Diagnosis Date  . Anteroseptal myocardial infarction [facility])    PTCA to LAD 1992  . BPH (benign prostatic hyperplasia)   . CAD (coronary artery disease)    PTCA to LAD 1992, BMS to RCA 2004  . CHF (congestive heart failure) ([place])   . Chronic atrial fibrillation ([place])   . Dyspnea   . Essential hypertension   . GERD (gastroesophageal reflux disease)   . GI bleed    March 2017 - esophagitis  . Hiatal hernia   . History of kidney stones   . Hyperlipidemia   . Hypokalemia   . Hypomagnesemia   . Ischemic cardiomyopathy  [name] 48% 2015  . Leukocytosis 05/09/2016  . Myocardial infarction, inferior wall ([name])    BMS to proximal RCA 2004 at [facility]  . Sleep apnea    On CPAP    Observations/Objective: Physical Exam Constitutional:      General: He is not in acute distress.    Appearance: Normal appearance. He is not ill-appearing or toxic-appearing.  Eyes:     General: No scleral icterus.       Right eye: No discharge.        Left eye: No discharge.     Conjunctiva/sclera: Conjunctivae normal.  Pulmonary:     Effort: Pulmonary effort is normal. No respiratory distress.  Neurological:     Mental Status: He is alert and oriented to  person, place, and time.  Psychiatric:        Mood and Affect: Mood normal.        Behavior: Behavior normal.        Thought Content: Thought content normal.        Judgment: Judgment normal.    Assessment and Plan: 1. Generalized weakness - Continue using walker for safety.  - Ambulatory referral to [place]  2. Chronic systolic congestive heart failure ([place]) - Ambulatory referral to Home Health   Follow Up Instructions: As scheduled    I discussed the assessment and treatment plan with the patient. The patient was provided an opportunity to ask questions and all were answered. The patient agreed with the plan and demonstrated an understanding of the instructions.   The patient was advised to call back or seek an in-person evaluation if the symptoms worsen or if the condition fails to improve as anticipated.  The above assessment and management plan was discussed with the patient. The patient verbalized understanding of and has agreed to the management plan. Patient is aware to call the clinic if symptoms persist or worsen. Patient is aware when to return to the clinic for a follow-up visit. Patient educated on when it is appropriate to go to the emergency department.   Time call ended: 11:14 AM  I provided 14 minutes of face-to-face time during this encounter.   [name], MSN, APRN, FNP-C [facility] 03/11/20

## 2020-03-16 ENCOUNTER — Other Ambulatory Visit: Payer: Self-pay

## 2020-03-16 ENCOUNTER — Ambulatory Visit (INDEPENDENT_AMBULATORY_CARE_PROVIDER_SITE_OTHER): Payer: Medicare Other

## 2020-03-16 DIAGNOSIS — R411 Anterograde amnesia: Secondary | ICD-10-CM | POA: Diagnosis not present

## 2020-03-16 DIAGNOSIS — F419 Anxiety disorder, unspecified: Secondary | ICD-10-CM

## 2020-03-16 DIAGNOSIS — N400 Enlarged prostate without lower urinary tract symptoms: Secondary | ICD-10-CM | POA: Diagnosis not present

## 2020-03-16 DIAGNOSIS — I482 Chronic atrial fibrillation, unspecified: Secondary | ICD-10-CM

## 2020-03-16 DIAGNOSIS — I110 Hypertensive heart disease with heart failure: Secondary | ICD-10-CM | POA: Diagnosis not present

## 2020-03-16 DIAGNOSIS — K219 Gastro-esophageal reflux disease without esophagitis: Secondary | ICD-10-CM

## 2020-03-16 DIAGNOSIS — Z7901 Long term (current) use of anticoagulants: Secondary | ICD-10-CM | POA: Diagnosis not present

## 2020-03-16 DIAGNOSIS — G4700 Insomnia, unspecified: Secondary | ICD-10-CM

## 2020-03-16 DIAGNOSIS — F321 Major depressive disorder, single episode, moderate: Secondary | ICD-10-CM | POA: Diagnosis not present

## 2020-03-16 DIAGNOSIS — I5023 Acute on chronic systolic (congestive) heart failure: Secondary | ICD-10-CM

## 2020-03-16 DIAGNOSIS — I2510 Atherosclerotic heart disease of native coronary artery without angina pectoris: Secondary | ICD-10-CM

## 2020-03-18 ENCOUNTER — Inpatient Hospital Stay: Payer: Medicare Other | Admitting: Family Medicine

## 2020-03-18 DIAGNOSIS — Z20828 Contact with and (suspected) exposure to other viral communicable diseases: Secondary | ICD-10-CM | POA: Diagnosis not present

## 2020-03-18 DIAGNOSIS — U071 COVID-19: Secondary | ICD-10-CM | POA: Diagnosis not present

## 2020-03-24 ENCOUNTER — Encounter: Payer: Self-pay | Admitting: Family Medicine

## 2020-03-26 ENCOUNTER — Encounter: Payer: Self-pay | Admitting: Family Medicine

## 2020-03-30 ENCOUNTER — Encounter: Payer: Self-pay | Admitting: Family Medicine

## 2020-03-30 ENCOUNTER — Telehealth (INDEPENDENT_AMBULATORY_CARE_PROVIDER_SITE_OTHER): Payer: Medicare Other | Admitting: Family Medicine

## 2020-03-30 DIAGNOSIS — G479 Sleep disorder, unspecified: Secondary | ICD-10-CM | POA: Diagnosis not present

## 2020-03-30 DIAGNOSIS — F419 Anxiety disorder, unspecified: Secondary | ICD-10-CM | POA: Diagnosis not present

## 2020-03-30 DIAGNOSIS — D492 Neoplasm of unspecified behavior of bone, soft tissue, and skin: Secondary | ICD-10-CM | POA: Diagnosis not present

## 2020-03-30 MED ORDER — MIRTAZAPINE 7.5 MG PO TABS: 7.5000 mg | ORAL_TABLET | Freq: Every day | ORAL | 1 refills | Status: AC

## 2020-03-30 NOTE — Progress Notes (Signed)
MyChart Video visit  Subjective: CC: dementia w/ mood disurbance PCP: [name], DO [id number] Henry Ayala is a 86 y.o. male. Patient provides verbal consent for consult held via video.  Due to COVID-19 pandemic this visit was conducted virtually. This visit type was conducted due to national recommendations for restrictions regarding the COVID-19 Pandemic (e.g. social distancing, sheltering in place) in an effort to limit this patient's exposure and mitigate transmission in our community. All issues noted in this document were discussed and addressed.  A physical exam was not performed with this format.   Location of patient: [facility] Location of provider: [facility] Others present for call: daughter  Patient's daughter reached out to me regarding patient's dementia, depressed mood, anxiety at bedtime and difficulty sleeping.  She worries about her father not understanding that he should reside in the nursing facility permanently, as he is not able to self care any longer.  Apparently there was a confusion regarding the last conversation that was held with one of my colleagues about whether or not he could return home.  Additionally, he admits to mildly depressed mood and having difficulty with sleep.  He feels that appetite is good.  He feels well taken care of at [facility] and understands that he is there to have help with his ADLs, medications, etc.   ROS: Per HPI  Allergies  Allergen Reactions  . Iodinated Diagnostic Agents     unknown  . Lipitor [Atorvastatin Calcium]     unknown  . Sulfa Drugs Cross Reactors     unknown   Past Medical History:  Diagnosis Date  . Anteroseptal myocardial infarction [facility])    PTCA to LAD 1992  . BPH (benign prostatic hyperplasia)   . CAD (coronary artery disease)    PTCA to LAD 1992, BMS to RCA 2004  . CHF (congestive heart failure) ([place])   . Chronic atrial fibrillation ([place])   . Dyspnea   . Essential hypertension   . GERD  (gastroesophageal reflux disease)   . GI bleed    March 2017 - esophagitis  . Hiatal hernia   . History of kidney stones   . Hyperlipidemia   . Hypokalemia   . Hypomagnesemia   . Ischemic cardiomyopathy    [name] 48% 2015  . Leukocytosis 05/09/2016  . Myocardial infarction, inferior wall ([name])    BMS to proximal RCA 2004 at [facility]  . Sleep apnea    On CPAP    Current Outpatient Medications:  .  acetaminophen (TYLENOL) 500 MG tablet, Take 500 mg by mouth every 6 (six) hours as needed for mild pain or headache., Disp: , Rfl:  .  apixaban (ELIQUIS) 5 MG TABS tablet, Take 1 tablet (5 mg total) by mouth 2 (two) times daily., Disp: 180 tablet, Rfl: 1 .  doxazosin (CARDURA) 2 MG tablet, Take 1 tablet (2 mg total) by mouth daily., Disp: 90 tablet, Rfl: 1 .  furosemide (LASIX) 40 MG tablet, Take 1 tablet (40 mg total) by mouth daily., Disp: 30 tablet, Rfl: 0 .  LORazepam (ATIVAN) 0.5 MG tablet, Take 0.5 tablets (0.25 mg total) by mouth at bedtime as needed for anxiety (agitation)., Disp: 30 tablet, Rfl: 0 .  lovastatin (MEVACOR) 40 MG tablet, Take 1 tablet (40 mg total) by mouth daily., Disp: 90 tablet, Rfl: 1 .  metoprolol succinate (TOPROL-XL) 25 MG 24 hr tablet, Take 1 tablet (25 mg total) by mouth daily., Disp: 90 tablet, Rfl: 3 .  Multiple Vitamins-Minerals (MULTIVITAMIN WITH  MINERALS) tablet, Take 1 tablet by mouth daily., Disp: , Rfl:  .  pantoprazole (PROTONIX) 40 MG tablet, Take 1 tablet (40 mg total) by mouth 2 (two) times daily., Disp: 180 tablet, Rfl: 1 .  spironolactone (ALDACTONE) 25 MG tablet, Take 1 tablet (25 mg total) by mouth daily., Disp: 90 tablet, Rfl: 1  Gen: elderly male, NAD Pulm: normal WOB on room air Skin: pigmented hyperkeratotic skin lesion ~ 3 mm noted along right anterior chest Psych: mood stable, speech normal  Assessment/ Plan: 86 y.o. male   1. Sleep disturbance Start Mirtazapine 7.5mg .  Reassess in 4 weeks via virtual visit. - mirtazapine (REMERON) 7.5  MG tablet; Take 1 tablet (7.5 mg total) by mouth at bedtime.  Dispense: 30 tablet; Refill: 1  2. Anxiety - mirtazapine (REMERON) 7.5 MG tablet; Take 1 tablet (7.5 mg total) by mouth at bedtime.  Dispense: 30 tablet; Refill: 1  3. Abnormal skin growth Looks to be a cutaneous horn.  Referred to derm for removal. - Ambulatory referral to Dermatology   Start time: 12:40pm End time: 12:55pm  Total time spent on patient care (including video visit/ documentation): 20 minutes  [place], [place] [phone]

## 2020-04-01 NOTE — Telephone Encounter (Signed)
Apparently, they need in [place] not [place].  Can we adjust this appt?

## 2020-04-02 ENCOUNTER — Encounter (HOSPITAL_COMMUNITY): Payer: Self-pay

## 2020-04-02 ENCOUNTER — Emergency Department (HOSPITAL_COMMUNITY): Payer: Medicare Other

## 2020-04-02 ENCOUNTER — Inpatient Hospital Stay (HOSPITAL_COMMUNITY)
Admission: EM | Admit: 2020-04-02 | Discharge: 2020-04-12 | DRG: 291 | Disposition: E | Payer: Medicare Other | Source: Skilled Nursing Facility | Attending: Internal Medicine | Admitting: Internal Medicine

## 2020-04-02 ENCOUNTER — Other Ambulatory Visit: Payer: Self-pay

## 2020-04-02 DIAGNOSIS — Z96651 Presence of right artificial knee joint: Secondary | ICD-10-CM | POA: Diagnosis present

## 2020-04-02 DIAGNOSIS — Z66 Do not resuscitate: Secondary | ICD-10-CM | POA: Diagnosis present

## 2020-04-02 DIAGNOSIS — Z79899 Other long term (current) drug therapy: Secondary | ICD-10-CM

## 2020-04-02 DIAGNOSIS — I4819 Other persistent atrial fibrillation: Secondary | ICD-10-CM | POA: Diagnosis present

## 2020-04-02 DIAGNOSIS — I452 Bifascicular block: Secondary | ICD-10-CM | POA: Diagnosis present

## 2020-04-02 DIAGNOSIS — R569 Unspecified convulsions: Secondary | ICD-10-CM | POA: Diagnosis not present

## 2020-04-02 DIAGNOSIS — I5023 Acute on chronic systolic (congestive) heart failure: Secondary | ICD-10-CM | POA: Diagnosis present

## 2020-04-02 DIAGNOSIS — R000 Tachycardia, unspecified: Secondary | ICD-10-CM | POA: Diagnosis not present

## 2020-04-02 DIAGNOSIS — I10 Essential (primary) hypertension: Secondary | ICD-10-CM | POA: Diagnosis not present

## 2020-04-02 DIAGNOSIS — E876 Hypokalemia: Secondary | ICD-10-CM | POA: Diagnosis not present

## 2020-04-02 DIAGNOSIS — I2510 Atherosclerotic heart disease of native coronary artery without angina pectoris: Secondary | ICD-10-CM | POA: Diagnosis not present

## 2020-04-02 DIAGNOSIS — N400 Enlarged prostate without lower urinary tract symptoms: Secondary | ICD-10-CM | POA: Diagnosis present

## 2020-04-02 DIAGNOSIS — F419 Anxiety disorder, unspecified: Secondary | ICD-10-CM | POA: Diagnosis present

## 2020-04-02 DIAGNOSIS — Z20822 Contact with and (suspected) exposure to covid-19: Secondary | ICD-10-CM | POA: Diagnosis present

## 2020-04-02 DIAGNOSIS — I5022 Chronic systolic (congestive) heart failure: Secondary | ICD-10-CM | POA: Diagnosis not present

## 2020-04-02 DIAGNOSIS — E785 Hyperlipidemia, unspecified: Secondary | ICD-10-CM | POA: Diagnosis present

## 2020-04-02 DIAGNOSIS — I959 Hypotension, unspecified: Secondary | ICD-10-CM | POA: Diagnosis not present

## 2020-04-02 DIAGNOSIS — N183 Chronic kidney disease, stage 3 unspecified: Secondary | ICD-10-CM | POA: Diagnosis present

## 2020-04-02 DIAGNOSIS — J9601 Acute respiratory failure with hypoxia: Secondary | ICD-10-CM | POA: Diagnosis not present

## 2020-04-02 DIAGNOSIS — I130 Hypertensive heart and chronic kidney disease with heart failure and stage 1 through stage 4 chronic kidney disease, or unspecified chronic kidney disease: Secondary | ICD-10-CM | POA: Diagnosis present

## 2020-04-02 DIAGNOSIS — I482 Chronic atrial fibrillation, unspecified: Secondary | ICD-10-CM | POA: Diagnosis not present

## 2020-04-02 DIAGNOSIS — Z955 Presence of coronary angioplasty implant and graft: Secondary | ICD-10-CM

## 2020-04-02 DIAGNOSIS — J90 Pleural effusion, not elsewhere classified: Secondary | ICD-10-CM

## 2020-04-02 DIAGNOSIS — Z882 Allergy status to sulfonamides status: Secondary | ICD-10-CM

## 2020-04-02 DIAGNOSIS — Z7901 Long term (current) use of anticoagulants: Secondary | ICD-10-CM | POA: Diagnosis not present

## 2020-04-02 DIAGNOSIS — I252 Old myocardial infarction: Secondary | ICD-10-CM | POA: Diagnosis not present

## 2020-04-02 DIAGNOSIS — R570 Cardiogenic shock: Secondary | ICD-10-CM | POA: Diagnosis not present

## 2020-04-02 DIAGNOSIS — Z91041 Radiographic dye allergy status: Secondary | ICD-10-CM

## 2020-04-02 DIAGNOSIS — Z515 Encounter for palliative care: Secondary | ICD-10-CM

## 2020-04-02 DIAGNOSIS — Z95828 Presence of other vascular implants and grafts: Secondary | ICD-10-CM

## 2020-04-02 DIAGNOSIS — F0390 Unspecified dementia without behavioral disturbance: Secondary | ICD-10-CM | POA: Diagnosis present

## 2020-04-02 DIAGNOSIS — I509 Heart failure, unspecified: Secondary | ICD-10-CM | POA: Diagnosis not present

## 2020-04-02 DIAGNOSIS — I5021 Acute systolic (congestive) heart failure: Secondary | ICD-10-CM

## 2020-04-02 DIAGNOSIS — I080 Rheumatic disorders of both mitral and aortic valves: Secondary | ICD-10-CM | POA: Diagnosis present

## 2020-04-02 DIAGNOSIS — Z03818 Encounter for observation for suspected exposure to other biological agents ruled out: Secondary | ICD-10-CM | POA: Diagnosis not present

## 2020-04-02 DIAGNOSIS — J9811 Atelectasis: Secondary | ICD-10-CM | POA: Diagnosis present

## 2020-04-02 DIAGNOSIS — Z8249 Family history of ischemic heart disease and other diseases of the circulatory system: Secondary | ICD-10-CM

## 2020-04-02 DIAGNOSIS — Z452 Encounter for adjustment and management of vascular access device: Secondary | ICD-10-CM | POA: Diagnosis not present

## 2020-04-02 DIAGNOSIS — R609 Edema, unspecified: Secondary | ICD-10-CM | POA: Diagnosis not present

## 2020-04-02 DIAGNOSIS — K219 Gastro-esophageal reflux disease without esophagitis: Secondary | ICD-10-CM | POA: Diagnosis present

## 2020-04-02 DIAGNOSIS — Z888 Allergy status to other drugs, medicaments and biological substances status: Secondary | ICD-10-CM

## 2020-04-02 DIAGNOSIS — G4733 Obstructive sleep apnea (adult) (pediatric): Secondary | ICD-10-CM | POA: Diagnosis present

## 2020-04-02 DIAGNOSIS — I255 Ischemic cardiomyopathy: Secondary | ICD-10-CM | POA: Diagnosis present

## 2020-04-02 DIAGNOSIS — Z7189 Other specified counseling: Secondary | ICD-10-CM

## 2020-04-02 DIAGNOSIS — G4700 Insomnia, unspecified: Secondary | ICD-10-CM | POA: Diagnosis present

## 2020-04-02 DIAGNOSIS — I110 Hypertensive heart disease with heart failure: Secondary | ICD-10-CM | POA: Diagnosis not present

## 2020-04-02 DIAGNOSIS — K449 Diaphragmatic hernia without obstruction or gangrene: Secondary | ICD-10-CM | POA: Diagnosis present

## 2020-04-02 DIAGNOSIS — N390 Urinary tract infection, site not specified: Secondary | ICD-10-CM | POA: Diagnosis present

## 2020-04-02 HISTORY — DX: Anxiety disorder, unspecified: F41.9

## 2020-04-02 HISTORY — DX: Unspecified atrial fibrillation: I48.91

## 2020-04-02 LAB — CBC WITH DIFFERENTIAL/PLATELET
Abs Immature Granulocytes: 0.04 10*3/uL (ref 0.00–0.07)
Basophils Absolute: 0 10*3/uL (ref 0.0–0.1)
Basophils Relative: 0 %
Eosinophils Absolute: 0.2 10*3/uL (ref 0.0–0.5)
Eosinophils Relative: 2 %
HCT: 45.8 % (ref 39.0–52.0)
Hemoglobin: 15 g/dL (ref 13.0–17.0)
Immature Granulocytes: 0 %
Lymphocytes Relative: 10 %
Lymphs Abs: 1.1 10*3/uL (ref 0.7–4.0)
MCH: 33.1 pg (ref 26.0–34.0)
MCHC: 32.8 g/dL (ref 30.0–36.0)
MCV: 101.1 fL — ABNORMAL HIGH (ref 80.0–100.0)
Monocytes Absolute: 0.6 10*3/uL (ref 0.1–1.0)
Monocytes Relative: 6 %
Neutro Abs: 8.6 10*3/uL — ABNORMAL HIGH (ref 1.7–7.7)
Neutrophils Relative %: 82 %
Platelets: 178 10*3/uL (ref 150–400)
RBC: 4.53 MIL/uL (ref 4.22–5.81)
RDW: 15.1 % (ref 11.5–15.5)
WBC: 10.5 10*3/uL (ref 4.0–10.5)
nRBC: 0 % (ref 0.0–0.2)

## 2020-04-02 LAB — URINALYSIS, ROUTINE W REFLEX MICROSCOPIC
Bilirubin Urine: NEGATIVE
Glucose, UA: NEGATIVE mg/dL
Hgb urine dipstick: NEGATIVE
Ketones, ur: NEGATIVE mg/dL
Leukocytes,Ua: NEGATIVE
Nitrite: NEGATIVE
Protein, ur: NEGATIVE mg/dL
Specific Gravity, Urine: 1.009 (ref 1.005–1.030)
pH: 5 (ref 5.0–8.0)

## 2020-04-02 LAB — COMPREHENSIVE METABOLIC PANEL
ALT: 76 U/L — ABNORMAL HIGH (ref 0–44)
AST: 41 U/L (ref 15–41)
Albumin: 3.2 g/dL — ABNORMAL LOW (ref 3.5–5.0)
Alkaline Phosphatase: 46 U/L (ref 38–126)
Anion gap: 14 (ref 5–15)
BUN: 31 mg/dL — ABNORMAL HIGH (ref 8–23)
CO2: 30 mmol/L (ref 22–32)
Calcium: 8.6 mg/dL — ABNORMAL LOW (ref 8.9–10.3)
Chloride: 94 mmol/L — ABNORMAL LOW (ref 98–111)
Creatinine, Ser: 1.14 mg/dL (ref 0.61–1.24)
GFR calc Af Amer: >60 mL/min (ref >60)
GFR calc non Af Amer: 58 mL/min — ABNORMAL LOW (ref >60)
Glucose, Bld: 106 mg/dL — ABNORMAL HIGH (ref 70–99)
Potassium: 3.7 mmol/L (ref 3.5–5.1)
Sodium: 138 mmol/L (ref 135–145)
Total Bilirubin: 1.5 mg/dL — ABNORMAL HIGH (ref 0.3–1.2)
Total Protein: 5.8 g/dL — ABNORMAL LOW (ref 6.5–8.1)

## 2020-04-02 LAB — SARS CORONAVIRUS 2 BY RT PCR (HOSPITAL ORDER, PERFORMED IN ~~LOC~~ HOSPITAL LAB): SARS Coronavirus 2: NEGATIVE

## 2020-04-02 LAB — TROPONIN I (HIGH SENSITIVITY)
Troponin I (High Sensitivity): 46 ng/L — ABNORMAL HIGH (ref <18)
Troponin I (High Sensitivity): 45 ng/L — ABNORMAL HIGH (ref <18)

## 2020-04-02 LAB — BRAIN NATRIURETIC PEPTIDE: B Natriuretic Peptide: 1210 pg/mL — ABNORMAL HIGH (ref 0.0–100.0)

## 2020-04-02 MED ORDER — DOXAZOSIN MESYLATE 2 MG PO TABS
2.0000 mg | ORAL_TABLET | Freq: Every day | ORAL | Status: DC
  Administered 2020-04-03 – 2020-04-04 (×2): 2 mg via ORAL
  Filled 2020-04-02 (×2): qty 1

## 2020-04-02 MED ORDER — SODIUM CHLORIDE 0.9 % IV SOLN: 250.0000 mL | INTRAVENOUS | Status: DC | PRN

## 2020-04-02 MED ORDER — PRAVASTATIN SODIUM 40 MG PO TABS
40.0000 mg | ORAL_TABLET | Freq: Every day | ORAL | Status: DC
  Administered 2020-04-03 – 2020-04-04 (×2): 40 mg via ORAL
  Filled 2020-04-02 (×2): qty 1

## 2020-04-02 MED ORDER — APIXABAN 5 MG PO TABS
5.0000 mg | ORAL_TABLET | Freq: Two times a day (BID) | ORAL | Status: DC
  Administered 2020-04-02 – 2020-04-05 (×6): 5 mg via ORAL
  Filled 2020-04-02 (×6): qty 1

## 2020-04-02 MED ORDER — MIRTAZAPINE 15 MG PO TABS
7.5000 mg | ORAL_TABLET | Freq: Every day | ORAL | Status: DC
  Administered 2020-04-02 – 2020-04-04 (×2): 7.5 mg via ORAL
  Filled 2020-04-02 (×3): qty 1

## 2020-04-02 MED ORDER — SODIUM CHLORIDE 0.9 % IV BOLUS
250.0000 mL | Freq: Once | INTRAVENOUS | Status: AC
  Administered 2020-04-02: 250 mL via INTRAVENOUS

## 2020-04-02 MED ORDER — ACETAMINOPHEN 325 MG PO TABS
650.0000 mg | ORAL_TABLET | ORAL | Status: DC | PRN
  Filled 2020-04-02: qty 2

## 2020-04-02 MED ORDER — LORAZEPAM 0.5 MG PO TABS
0.2500 mg | ORAL_TABLET | Freq: Every evening | ORAL | Status: DC | PRN
  Administered 2020-04-03: 0.25 mg via ORAL
  Filled 2020-04-02: qty 1

## 2020-04-02 MED ORDER — FUROSEMIDE 10 MG/ML IJ SOLN
40.0000 mg | Freq: Two times a day (BID) | INTRAMUSCULAR | Status: DC
  Administered 2020-04-03 (×2): 40 mg via INTRAVENOUS
  Filled 2020-04-02 (×3): qty 4

## 2020-04-02 MED ORDER — SPIRONOLACTONE 25 MG PO TABS
25.0000 mg | ORAL_TABLET | Freq: Every day | ORAL | Status: DC
  Administered 2020-04-03: 25 mg via ORAL
  Filled 2020-04-02 (×2): qty 1

## 2020-04-02 MED ORDER — FUROSEMIDE 10 MG/ML IJ SOLN
40.0000 mg | Freq: Once | INTRAMUSCULAR | Status: AC
  Administered 2020-04-02: 40 mg via INTRAVENOUS
  Filled 2020-04-02: qty 4

## 2020-04-02 MED ORDER — METOPROLOL SUCCINATE ER 25 MG PO TB24
12.5000 mg | ORAL_TABLET | Freq: Every day | ORAL | Status: DC
  Administered 2020-04-03: 12.5 mg via ORAL
  Filled 2020-04-02 (×2): qty 1

## 2020-04-02 MED ORDER — PANTOPRAZOLE SODIUM 40 MG PO TBEC
40.0000 mg | DELAYED_RELEASE_TABLET | Freq: Two times a day (BID) | ORAL | Status: DC
  Administered 2020-04-02 – 2020-04-05 (×6): 40 mg via ORAL
  Filled 2020-04-02 (×6): qty 1

## 2020-04-02 MED ORDER — POTASSIUM CHLORIDE CRYS ER 20 MEQ PO TBCR
40.0000 meq | EXTENDED_RELEASE_TABLET | Freq: Every day | ORAL | Status: DC
  Administered 2020-04-03 – 2020-04-05 (×3): 40 meq via ORAL
  Filled 2020-04-02 (×3): qty 2

## 2020-04-02 MED ORDER — SODIUM CHLORIDE 0.9% FLUSH
3.0000 mL | Freq: Two times a day (BID) | INTRAVENOUS | Status: DC
  Administered 2020-04-02 – 2020-04-05 (×3): 3 mL via INTRAVENOUS

## 2020-04-02 MED ORDER — ONDANSETRON HCL 4 MG/2ML IJ SOLN: 4.0000 mg | Freq: Four times a day (QID) | INTRAMUSCULAR | Status: DC | PRN

## 2020-04-02 MED ORDER — SODIUM CHLORIDE 0.9% FLUSH: 3.0000 mL | INTRAVENOUS | Status: DC | PRN

## 2020-04-02 NOTE — Progress Notes (Signed)
Patient arrived via carelink, has right inguinal hernia, BLE with +2 pitting edema. No complaints from pa. Patient on O2 via nasal cannula at 3L. Stood up for standing weight with no problems. Patient pleasant and A&O x3( a little confused on situation and didn't know know exactly where he was). Admissions team paged.  Telemetry initiated and verified- a.fib.

## 2020-04-02 NOTE — ED Provider Notes (Signed)
[facility] EMERGENCY DEPARTMENT Provider Note   CSN: [id number] Arrival date & time: 03/20/2020  1156     History Chief Complaint  Patient presents with  . Hypotension    Henry Ayala is a 86 y.o. male with past medical history significant for hyperlipidemia, chronic A. fib on Xarelto, hypertension, CAD and CHF with EF of 20 to 30%, dementia presents to emergency department today via EMS with chief complaint of hypertension.  Patient is a resident at [place].  Per staff he was found to have a pressure of 92/63 with oxygen saturation of 92% on room air with swelling to his bilateral lower extremities. He does not wear oxygen at baseline.  Patient is unable to provide history secondary to dementia therefore level 5 caveat applies.    Past Medical History:  Diagnosis Date  . Anteroseptal myocardial infarction [facility])    PTCA to LAD 1992  . Anxiety   . Atrial fibrillation ([place])   . BPH (benign prostatic hyperplasia)   . CAD (coronary artery disease)    PTCA to LAD 1992, BMS to RCA 2004  . CHF (congestive heart failure) ([place])   . Chronic atrial fibrillation ([place])   . Dyspnea   . Essential hypertension   . GERD (gastroesophageal reflux disease)   . GI bleed    March 2017 - esophagitis  . Hiatal hernia   . History of kidney stones   . Hyperlipidemia   . Hypokalemia   . Hypomagnesemia   . Ischemic cardiomyopathy    [name] 48% 2015  . Leukocytosis 05/09/2016  . Myocardial infarction, inferior wall ([name])    BMS to proximal RCA 2004 at [facility] [address]  . Sleep apnea    On CPAP    Patient Active Problem List   Diagnosis Date Noted  . Acute on chronic systolic CHF (congestive heart failure) ([place]) 02/04/2020  . SOB (shortness of breath) 01/29/2020  . Sleep disturbance 01/29/2020  . Chronic systolic congestive heart failure ([place]) 09/05/2017  . Neoplasm of uncertain behavior of skin 01/25/2017  . Depression 07/19/2016  . CAD (coronary artery disease) 05/09/2016  .  Diaphragmatic hernia without obstruction and without gangrene 05/09/2016  . Memory loss 04/17/2016  . Normocytic anemia 03/12/2016  . Upper GI bleed 02/12/2016  . Benign essential HTN 02/12/2016  . Anticoagulant long-term use 02/12/2016  . Chronic atrial fibrillation ([place]) 04/28/2014  . Inguinal hernia 04/28/2014  . Sleep apnea 10/26/2013  . Ischemic heart disease 04/27/2011  . Hypercholesterolemia 04/27/2011  . GERD (gastroesophageal reflux disease) 04/27/2011  . BPH (benign prostatic hyperplasia) 04/27/2011    Past Surgical History:  Procedure Laterality Date  . CARDIOVERSION N/A 06/30/2014   Procedure: CARDIOVERSION;  Surgeon: Sanda Klein, MD;  [facility]: [facility] ENDOSCOPY;  Service: Cardiovascular;  Laterality: N/A;  . ESOPHAGOGASTRODUODENOSCOPY Left 02/14/2016   Procedure: ESOPHAGOGASTRODUODENOSCOPY (EGD);  Surgeon: Wonda Horner, MD;  Location: [facility];  Service: Endoscopy;  Laterality: Left;  . EYE SURGERY Bilateral   . TONSILLECTOMY    . TOTAL KNEE ARTHROPLASTY     right       Family History  Problem Relation Age of Onset  . Heart attack Father   . Stroke Mother   . Cancer Mother        colon  . Heart attack Brother 57  . Cancer Brother        prostate  . Cancer Sister        colon  . Parkinson's disease Sister   . Stroke  Maternal Grandmother   . Cancer Daughter        unknown origin   . GI problems Son     Social History   Tobacco Use  . Smoking status: Never Smoker  . Smokeless tobacco: Never Used  Substance Use Topics  . Alcohol use: Yes    Alcohol/week: 0.0 standard drinks    Comment: occasionally drink  . Drug use: No    Home Medications Prior to Admission medications   Medication Sig Start Date End Date Taking? Authorizing Provider  acetaminophen (TYLENOL) 500 MG tablet Take 500 mg by mouth every 6 (six) hours as needed for mild pain or headache.   Yes [provider]  apixaban (ELIQUIS) 5 MG TABS tablet Take 1 tablet (5 mg total)  by mouth 2 (two) times daily. 09/15/19  Yes Ronnie Doss M, DO  doxazosin (CARDURA) 2 MG tablet Take 1 tablet (2 mg total) by mouth daily. 09/15/19  Yes Gottschalk, Leatrice Jewels M, DO  furosemide (LASIX) 40 MG tablet Take 1 tablet (40 mg total) by mouth daily. 02/10/20  Yes Hoyt Koch, MD  lovastatin (MEVACOR) 40 MG tablet Take 1 tablet (40 mg total) by mouth daily. 09/15/19  Yes Ronnie Doss M, DO  metoprolol succinate (TOPROL-XL) 25 MG 24 hr tablet Take 1 tablet (25 mg total) by mouth daily. 01/11/20  Yes Gottschalk, Leatrice Jewels M, DO  mirtazapine (REMERON) 7.5 MG tablet Take 1 tablet (7.5 mg total) by mouth at bedtime. 03/30/20  Yes Gottschalk, Leatrice Jewels M, DO  Multiple Vitamins-Minerals (THEREMS-M) TABS Take 1 tablet by mouth daily.   Yes [provider]  pantoprazole (PROTONIX) 40 MG tablet Take 1 tablet (40 mg total) by mouth 2 (two) times daily. 05/26/19  Yes Ronnie Doss M, DO  spironolactone (ALDACTONE) 25 MG tablet Take 1 tablet (25 mg total) by mouth daily. 09/15/19  Yes Gottschalk, Ashly M, DO  LORazepam (ATIVAN) 0.5 MG tablet Take 0.5 tablets (0.25 mg total) by mouth at bedtime as needed for anxiety (agitation). Patient not taking: Reported on 03/24/2020 02/09/20 02/08/21  Hoyt Koch, MD    Allergies    Iodinated diagnostic agents, Lipitor [atorvastatin calcium], and Sulfa drugs cross reactors  Review of Systems   Review of Systems  Unable to perform ROS: Dementia    Physical Exam Updated Vital Signs BP 104/65 (BP Location: Left Arm)   Pulse 68   Temp 97.9 F (36.6 C) (Oral)   Resp 18   Ht 5\' 7"  (1.702 m)   Wt 64.9 kg   BMI 22.40 kg/m   Physical Exam Vitals and nursing note reviewed.  Constitutional:      General: He is not in acute distress.    Appearance: He is not ill-appearing.  HENT:     Head: Normocephalic and atraumatic.     Right Ear: Tympanic membrane and external ear normal.     Left Ear: Tympanic membrane and external ear normal.      Nose: Nose normal.     Mouth/Throat:     Mouth: Mucous membranes are moist.     Pharynx: Oropharynx is clear.  Eyes:     General: No scleral icterus.       Right eye: No discharge.        Left eye: No discharge.     Extraocular Movements: Extraocular movements intact.     Conjunctiva/sclera: Conjunctivae normal.     Pupils: Pupils are equal, round, and reactive to light.  Neck:     Vascular:  No JVD.  Cardiovascular:     Rate and Rhythm: Normal rate. Rhythm irregular.     Pulses:          Radial pulses are 2+ on the right side and 2+ on the left side.       Dorsalis pedis pulses are detected w/ Doppler on the right side and detected w/ Doppler on the left side.     Heart sounds: Normal heart sounds.  Pulmonary:     Comments: Lungs clear to auscultation in all fields. Symmetric chest rise. No wheezing, rales, or rhonchi.  Hypoxic to 86% on room air.  Abdominal:     Comments: Abdomen is soft, non-distended, and non-tender in all quadrants. No rigidity, no guarding. No peritoneal signs.  Musculoskeletal:     Cervical back: Normal range of motion.     Right lower leg: 1+ Edema present.     Left lower leg: 1+ Edema present.  Skin:    General: Skin is warm and dry.     Capillary Refill: Capillary refill takes less than 2 seconds.  Neurological:     GCS: GCS eye subscore is 4. GCS verbal subscore is 5. GCS motor subscore is 6.     Comments: Fluent speech, no facial droop.  Alert to self and location. Unable to tell date and month.  Strong and equal grip strength in bilateral upper and lower extremities.  Psychiatric:        Behavior: Behavior normal.     ED Results / Procedures / Treatments   Labs (all labs ordered are listed, but only abnormal results are displayed) Labs Reviewed  COMPREHENSIVE METABOLIC PANEL - Abnormal; Notable for the following components:      Result Value   Chloride 94 (*)    Glucose, Bld 106 (*)    BUN 31 (*)    Calcium 8.6 (*)    Total Protein 5.8  (*)    Albumin 3.2 (*)    ALT 76 (*)    Total Bilirubin 1.5 (*)    GFR calc non Af Amer 58 (*)    All other components within normal limits  CBC WITH DIFFERENTIAL/PLATELET - Abnormal; Notable for the following components:   MCV 101.1 (*)    Neutro Abs 8.6 (*)    All other components within normal limits  BRAIN NATRIURETIC PEPTIDE - Abnormal; Notable for the following components:   B Natriuretic Peptide 1,210.0 (*)    All other components within normal limits  TROPONIN I (HIGH SENSITIVITY) - Abnormal; Notable for the following components:   Troponin I (High Sensitivity) 46 (*)    All other components within normal limits  SARS CORONAVIRUS 2 BY RT PCR (HOSPITAL ORDER, [facility])  URINALYSIS, ROUTINE W REFLEX MICROSCOPIC  TROPONIN I (HIGH SENSITIVITY)    EKG EKG Interpretation  Date/Time:  Saturday Apr 02 2020 13:43:32 EDT Ventricular Rate:  75 PR Interval:    QRS Duration: 176 QT Interval:  438 QTC Calculation: 490 R Axis:   -111 Text Interpretation: Atrial fibrillation RBBB and LAFB Baseline wander in lead(s) II III aVF V6 No significant change since last tracing Confirmed by Fredia Sorrow [phone]) on 03/27/2020 2:41:56 PM   Radiology DG Chest Portable 1 View  Result Date: 03/16/2020 CLINICAL DATA:  Hypotension EXAM: PORTABLE CHEST 1 VIEW COMPARISON:  02/04/2019 FINDINGS: Cardiac shadow is enlarged. Aortic calcifications are again seen. Pleural fluid is noted on the right and stable from the prior exam. Large hiatal hernia  is noted. Mild bibasilar atelectasis is seen. Mild vascular congestion is noted. IMPRESSION: Large hiatal hernia. Mild vascular congestion. Small right pleural effusion and bibasilar atelectasis. Electronically Signed   By: Inez Catalina M.D.   On: 03/22/2020 13:24    Procedures Procedures (including critical care time)  Medications Ordered in ED Medications  furosemide (LASIX) injection 40 mg (has no administration in time  range)  sodium chloride 0.9 % bolus 250 mL (250 mLs Intravenous New Bag/Given 03/26/2020 1452)    ED Course  I have reviewed the triage vital signs and the nursing notes.  Pertinent labs & imaging results that were available during my care of the patient were reviewed by me and considered in my medical decision making (see chart for details).  Clinical Course as of Apr 03 1527  Sat Apr 02, 2020  1527 Patient's daughter is now at the bedside.  She reports she visited patient x3 days ago.  At that time she noticed he was short of breath and had generalized weakness.  He has had decreased appetite as well she reports.  She reports compliance with his medications as they are administered by staff at [place].  She states he is mentally at his baseline.   [KA]    Clinical Course User Index [KA] Rishav Rockefeller, Harley Hallmark, PA-C   MDM Rules/Calculators/A&P                     History provided by patient with additional history obtained from chart review.     Patient seen and examined. Patient presents awake, alert, hemodynamically stable, afebrile, non toxic. On my exam patient is hypoxic to 86% on room air.  2 L nasal cannula applied with improvement to 94%.  Patient is a mouth breather and is falling asleep during the exam causing his oxygen saturation to desat into the 80s.  When prompted to take a deep breath improves again to above 92%. Pressure in triage 104/65.  Patient's lungs are clear to auscultation all fields.  He has no abdominal tenderness.  He does have bilateral lower extremity edema 1+.  Bilateral feet are cold to the touch on my initial exam, DP pulse detected with Doppler. On reassessment after blanket wrapped around feet they are appropriately warm to the touch.  Given small fluid bolus of 250 mL as he has significantly decreased [name] of 20 to 30%.  EKG shows rate controlled afib, no ischemic changes compared to prior.  CBC with no leukocytosis, no anemia.  CMP with elevated BUN at 31  consistent with baseline, creatinine normal, ALT and total bilirubin are slightly elevated.  On exam patient has no abdominal tenderness, no signs of an acute abdomen.  BNP is elevated at 1210.0, previous to compare this to is x 1 month ago when it was 756.6. First troponin is 46, which is lower compared to prior. Chest xray viewed by me shows mild vascular congestion and a small right pleural effusion that appears stable compared to prior imaging on 02/04/2019.  Also has mild bibasilar atelectasis. UA is without infection.  This case was discussed with Dr. Rogene Houston who has seen the patient and agrees with plan to admit for CHF exacerbation requiring IV diuresis.  Will give 40mg  IV lasix for now. Spoke with Dr. Nehemiah Settle with hospitalist service who agrees to assume care of patient and bring into the hospital for further evaluation and management.     Portions of this note were generated with Lobby[employer]. Dictation errors  may occur despite best attempts at proofreading.    Final Clinical Impression(s) / ED Diagnoses Final diagnoses:  Acute on chronic congestive heart failure, unspecified heart failure type [facility])    Rx / DC Orders ED Discharge Orders    None       Cherre Robins, PA-C 03/18/2020 1544    Fredia Sorrow, MD 05/03/20 2231

## 2020-04-02 NOTE — ED Triage Notes (Signed)
Pt resident at [facility].  Sent for evaluation of low bp and swelling in both feet.  Reports feet cool to touch.  Pt alert, denies any pain.  Reports bp 92/63, o2 sat 92% hr 74.  Reports pt has dementia.

## 2020-04-02 NOTE — H&P (Signed)
History and Physical  SUMMER HAENEL [id number] DOB: 1931-04-28 DOA: 03/12/2020  Referring physician: Emeterio Reeve, PA-C, ED physician PCP: [name], DO  Outpatient Specialists: Domenic Polite (Cardiology)  Patient Coming From: home  Chief Complaint: [place]  HPI: MARQUEE WAVRA is a 86 y.o. male with a history of atrial fibrillation on anticoagulation, coronary artery disease with history of MI, chronic systolic heart failure with an EF of 15 to 20% on echo on 02/05/2020 with global hypokinesis of the left ventricle, hypertension, GERD.  Patient is a resident of [facility] assisted living facility and was brought to the hospital by his daughter due to continued shortness of breath since Wednesday.  Patient short of breath with exertion and has significant orthopnea to the point where he sleeps on the sofa in a more up right position.  Symptoms are worse with exertion and improved with rest.  Denies fevers, chills, cough.  Emergency Department Course: Checks x-ray shows pulmonary congestion.  Lasix 40 mg given.  Patient's blood pressure has been in the 0000000 systolically.  Review of Systems:   Pt denies any fevers, chills, nausea, vomiting, diarrhea, constipation, abdominal pain, palpitations, headache, vision changes, lightheadedness, dizziness, melena, rectal bleeding.  Review of systems are otherwise negative  Past Medical History:  Diagnosis Date  . Anteroseptal myocardial infarction [facility])    PTCA to LAD 1992  . Anxiety   . Atrial fibrillation ([place])   . BPH (benign prostatic hyperplasia)   . CHF (congestive heart failure) ([place])   . Chronic atrial fibrillation ([place])   . Essential hypertension   . GERD (gastroesophageal reflux disease)   . GI bleed    March 2017 - esophagitis  . Hiatal hernia   . History of kidney stones   . Hyperlipidemia   . Ischemic cardiomyopathy    [name] 48% 2015  . Myocardial infarction, inferior wall ([name])    BMS to proximal RCA 2004 at [facility]  . Sleep apnea    On CPAP   Past Surgical History:  Procedure Laterality Date  . CARDIOVERSION N/A 06/30/2014   Procedure: CARDIOVERSION;  Surgeon: Sanda Klein, MD;  Location: [facility] ENDOSCOPY;  Service: Cardiovascular;  Laterality: N/A;  . ESOPHAGOGASTRODUODENOSCOPY Left 02/14/2016   Procedure: ESOPHAGOGASTRODUODENOSCOPY (EGD);  Surgeon: Wonda Horner, MD;  Location: [facility];  Service: Endoscopy;  Laterality: Left;  . EYE SURGERY Bilateral   . TONSILLECTOMY    . TOTAL KNEE ARTHROPLASTY     right   Social History:  reports that he has never smoked. He has never used smokeless tobacco. He reports current alcohol use. He reports that he does not use drugs. Patient lives at [facility] assisted living facility  Allergies  Allergen Reactions  . Iodinated Diagnostic Agents     unknown  . Lipitor [Atorvastatin Calcium]     unknown  . Sulfa Drugs Cross Reactors     unknown    Family History  Problem Relation Age of Onset  . Heart attack Father   . Stroke Mother   . Cancer Mother        colon  . Heart attack Brother 51  . Cancer Brother        prostate  . Cancer Sister        colon  . Parkinson's disease Sister   . Stroke Maternal Grandmother   . Cancer Daughter        unknown origin   . GI problems Son       Prior to  Admission medications   Medication Sig Start Date End Date Taking? Authorizing Provider  acetaminophen (TYLENOL) 500 MG tablet Take 500 mg by mouth every 6 (six) hours as needed for mild pain or headache.   Yes [provider]  apixaban (ELIQUIS) 5 MG TABS tablet Take 1 tablet (5 mg total) by mouth 2 (two) times daily. 09/15/19  Yes Ronnie Doss M, DO  doxazosin (CARDURA) 2 MG tablet Take 1 tablet (2 mg total) by mouth daily. 09/15/19  Yes Gottschalk, Leatrice Jewels M, DO  furosemide (LASIX) 40 MG tablet Take 1 tablet (40 mg total) by mouth daily. 02/10/20  Yes Hoyt Koch, MD  lovastatin (MEVACOR) 40 MG tablet  Take 1 tablet (40 mg total) by mouth daily. 09/15/19  Yes Ronnie Doss M, DO  metoprolol succinate (TOPROL-XL) 25 MG 24 hr tablet Take 1 tablet (25 mg total) by mouth daily. 01/11/20  Yes Gottschalk, Leatrice Jewels M, DO  mirtazapine (REMERON) 7.5 MG tablet Take 1 tablet (7.5 mg total) by mouth at bedtime. 03/30/20  Yes Gottschalk, Leatrice Jewels M, DO  Multiple Vitamins-Minerals (THEREMS-M) TABS Take 1 tablet by mouth daily.   Yes [provider]  pantoprazole (PROTONIX) 40 MG tablet Take 1 tablet (40 mg total) by mouth 2 (two) times daily. 05/26/19  Yes Ronnie Doss M, DO  spironolactone (ALDACTONE) 25 MG tablet Take 1 tablet (25 mg total) by mouth daily. 09/15/19  Yes Gottschalk, Ashly M, DO  LORazepam (ATIVAN) 0.5 MG tablet Take 0.5 tablets (0.25 mg total) by mouth at bedtime as needed for anxiety (agitation). Patient not taking: Reported on 03/12/2020 02/09/20 02/08/21  Hoyt Koch, MD    Physical Exam: BP 91/62 (BP Location: Left Arm)   Pulse 64   Temp 98 F (36.7 C) (Oral)   Resp 19   Ht 5\' 7"  (1.702 m)   Wt 64.9 kg   SpO2 93%   BMI 22.40 kg/m   . General: Elderly male. Awake and alert and oriented x3. No acute cardiopulmonary distress.  [name] HEENT: Normocephalic atraumatic.  Right and left ears normal in appearance.  Pupils equal, round, reactive to light. Extraocular muscles are intact. Sclerae anicteric and noninjected.  Moist mucosal membranes. No mucosal lesions.  . Neck: Neck supple without lymphadenopathy. No carotid bruits. No masses palpated.  . Cardiovascular: Irregularly irregular rate. No murmurs, rubs, gallops auscultated.  Increased JVD.  2+ pitting edema that extends from his feet to his mid shin . Respiratory: Rales in bases bilaterally.  No accessory muscle use. . Abdomen: Soft, nontender, nondistended. Active bowel sounds. No masses or hepatosplenomegaly  . Skin: No rashes, lesions, or ulcerations.  Dry, warm to touch. 2+ dorsalis pedis and radial  pulses. . Musculoskeletal: No calf or leg pain. All major joints not erythematous nontender.  No upper or lower joint deformation.  Good ROM.  No contractures  . Psychiatric: Intact judgment and insight. Pleasant and cooperative. . Neurologic: No focal neurological deficits. Strength is 5/5 and symmetric in upper and lower extremities.  Cranial nerves II through XII are grossly intact.           Labs on Admission: I have personally reviewed following labs and imaging studies  CBC: Recent Labs  Lab 03/25/2020 1348  WBC 10.5  NEUTROABS 8.6*  HGB 15.0  HCT 45.8  MCV 101.1*  PLT 0000000   Basic Metabolic Panel: Recent Labs  Lab 04/10/2020 1348  NA 138  K 3.7  CL 94*  CO2 30  GLUCOSE 106*  BUN 31*  CREATININE 1.14  CALCIUM 8.6*   GFR: Estimated Creatinine Clearance: 42.7 mL/min (by C-G formula based on SCr of 1.14 mg/dL). Liver Function Tests: Recent Labs  Lab 04/08/2020 1348  AST 41  ALT 76*  ALKPHOS 46  BILITOT 1.5*  PROT 5.8*  ALBUMIN 3.2*   No results for input(s): LIPASE, AMYLASE in the last 168 hours. No results for input(s): AMMONIA in the last 168 hours. Coagulation Profile: No results for input(s): INR, PROTIME in the last 168 hours. Cardiac Enzymes: No results for input(s): CKTOTAL, CKMB, CKMBINDEX, TROPONINI in the last 168 hours. BNP (last 3 results) No results for input(s): PROBNP in the last 8760 hours. HbA1C: No results for input(s): HGBA1C in the last 72 hours. CBG: No results for input(s): GLUCAP in the last 168 hours. Lipid Profile: No results for input(s): CHOL, HDL, LDLCALC, TRIG, CHOLHDL, LDLDIRECT in the last 72 hours. Thyroid Function Tests: No results for input(s): TSH, T4TOTAL, FREET4, T3FREE, THYROIDAB in the last 72 hours. Anemia Panel: No results for input(s): VITAMINB12, FOLATE, FERRITIN, TIBC, IRON, RETICCTPCT in the last 72 hours. Urine analysis:    Component Value Date/Time   COLORURINE YELLOW 03/15/2020 [place]  03/20/2020 1327   LABSPEC 1.009 04/08/2020 1327   PHURINE 5.0 04/04/2020 1327   GLUCOSEU NEGATIVE 04/08/2020 1327   [place] 04/01/2020 [place] 03/12/2020 1327   KETONESUR NEGATIVE 04/03/2020 1327   PROTEINUR NEGATIVE 03/22/2020 1327   NITRITE NEGATIVE 03/31/2020 1327   LEUKOCYTESUR NEGATIVE 03/13/2020 1327   Sepsis Labs: @LABRCNTIP (procalcitonin:4,lacticidven:4) )No results found for this or any previous visit (from the past 240 hour(s)).   Radiological Exams on Admission: DG Chest Portable 1 View  Result Date: 03/12/2020 CLINICAL DATA:  Hypotension EXAM: PORTABLE CHEST 1 VIEW COMPARISON:  02/04/2019 FINDINGS: Cardiac shadow is enlarged. Aortic calcifications are again seen. Pleural fluid is noted on the right and stable from the prior exam. Large hiatal hernia is noted. Mild bibasilar atelectasis is seen. Mild vascular congestion is noted. IMPRESSION: Large hiatal hernia. Mild vascular congestion. Small right pleural effusion and bibasilar atelectasis. Electronically Signed   By: Inez Catalina M.D.   On: 04/04/2020 13:24    EKG: Independently reviewed.  Atrial fibrillation right bundle branch block and left anterior fascicular block.  No acute ST changes  Assessment/Plan: Active Problems:   GERD (gastroesophageal reflux disease)   BPH (benign prostatic hyperplasia)   Chronic atrial fibrillation ([name])   Benign essential HTN   Anticoagulant long-term use   CAD (coronary artery disease)   Acute on chronic systolic CHF (congestive heart failure) ([place])   Acute respiratory failure with hypoxia [facility])    This patient was discussed with the ED physician, including pertinent vitals, physical exam findings, labs, and imaging.  We also discussed care given by the ED provider.  1. Acute respiratory failure with hypoxia a. Continue supplemental oxygen b. Admit to [facility] due to heart failure and tenuous blood pressure 2. Acute on chronic systolic heart  failure Telemetry monitoring Strict I/O Daily Weight Diuresis: Lasix 40 mg IV, restart spironolactone tomorrow Potassium: 40 mEq daily by mouth Echo cardiac exam done within 6 months - 15-20% with global LV hypokinesis Repeat BMP tomorrow 3. Coronary artery disease with hypertension a. Decrease metoprolol to 12.5 mg.  Hold parameters placed 4. Atrial fibrillation on long-term anticoagulation a. Rate controlled on metoprolol b. Continue Eliquis 5. BPH a. On Cardura 6. GERD a. Continue PPI  DVT prophylaxis: On Eliquis Consultants: Cardiology  Code Status: Full code Family Communication: Patient's daughter present during interview and exam Disposition Plan: Patient should be able to return to assisted living facility   Truett Mainland, DO

## 2020-04-02 NOTE — ED Provider Notes (Signed)
Medical screening examination/treatment/procedure(s) were conducted as a shared visit with non-physician practitioner(s) and myself.  I personally evaluated the patient during the encounter.  EKG Interpretation  Date/Time:  Saturday Apr 02 2020 13:43:32 EDT Ventricular Rate:  75 PR Interval:    QRS Duration: 176 QT Interval:  438 QTC Calculation: 490 R Axis:   -111 Text Interpretation: Atrial fibrillation RBBB and LAFB Baseline wander in lead(s) II III aVF V6 No significant change since last tracing Confirmed by Fredia Sorrow [phone]) on 03/24/2020 2:41:56 PM   Patient seen by me along with the physician assistant.  Patient sent in from nursing home for feet swelling low blood pressure.  Here had some spells of hypoxia.  Started on 2 L nasal cannula.  Chest x-ray without evidence of florid pulmonary edema patient's BNP is elevated.  Feeling that he is having some worsening congestive heart failure.  Has a history of that and has a very poor ejection fracture.  Patient will require admission and diuresis.   Fredia Sorrow, MD 03/24/2020 [phone]

## 2020-04-02 NOTE — Consult Note (Signed)
CARDIOLOGY CONSULT NOTE  Patient ID: Henry Ayala MRN: [id number] DOB/AGE: October 13, 1931 86 y.o.  Admit date: 04/08/2020 Primary Care Provider: [name], DO Primary Cardiologist: Henry Lesches, MD  Primary Electrophysiologist:  None  Chief Complaint: Shortness of breath, hypoxia, borderline BP Requesting: Consultation.  ASSESSMENT AND PLAN:  Henry Ayala is a 86 y.o. male with a history of CAD s/p PTCA of LAD in 1992 and BMS to RCA in [id number], chronic systolic CHF/ischemic cardiomyopathy with EF of 25-30% in 08/2017, chronic atrial fibrillation on Eliquis, sleep apnea, hypertension, hyperlipidemia, GERD, and BPH admitted with of acute on chronic CHF (Decompensated Heart failure). Cardiology is consulted for management of heart failure.  1. Acute on chronic HFrEF, Ischecmic CMP, [name] 15-20% 2. Acute hypoxic respiratory failure sec to acute pulmonary edema due to above 3. CAD s/p PTCA LAD 1992, BMS RCA 2004- no angina 4. Persistent Afib on eliquis- rate controlled 5. Comorbidities: HTN, HLD, OSA, Dementia, GERD, BPH  Plan   - Continue IV lasix 40mg  BID, goal net negative 3lts, strict I/Os, daily weights  - continue home medications- eliquis 5mg  BID (If his weight <60kg then need to be switched to 2.5mg  BID)  GDMT: - continue toprol xl 25mg , aldactone 25mg  daily. If his BP is stable then  May try low dose ACE/ARB. Previously not tried due to soft BP  - Rest of the management per primary team  HPI:  Henry Ayala is a 86 year old male with the above history who is followed by Dr. Domenic Ayala. He has a history of a remote MI back in 1992 which was treated with a PTCA to the LAD. He had another MI in 2004 which was treated with BMS to RCA at [facility]. Echo from 2015 showed EF of 48%. Repeat Echo in 08/2017 showed EF of 25-30% with diffuse hypokinesis and akinesis of the mid anterior and basal-mid anteroseptal myocardium concerning for ischemia. Myoview (NM) at this time revealed  a large region of scar involving the anterior, anteroseptal, and inferior walls without any active ischemia. Plan was for medical therapy at that time. Patient was last seen by Dr. Domenic Ayala in 09/2018 at which time he was doing well from a cardiac standpoint.  The patient was recently admitted to [facility] on 3/25-3/30/21 and cardiology was consulted. He was diuresed with IV lasix and was sent to SNF .  Today, the patient was admitted to the hospital from his nursing home [facility]) with concerns of sob since Wednesday, low BP 90s. Per report: IV lasix was given in the ER and plan to transfer to [place] for admission. Metoprolol was decreased to 12.5mg  with holding parameters. He was hypoxic in the ER needing supplementary oxygen. Labs show: Cr 1.15, BNP 1,210, Trop 45/46  Patient reports SOB x 1 week and LE edema. He is slow to respond but AOx1, also has dementia and denies any other complaints  EKG today :Afib, LAFB, LBBB (old- present before- reported IVCD) QRS is pretty wide 180-14ms  ECHO: 02/05/20 IMPRESSIONS    1. Left ventricular ejection fraction, by estimation, is 15-20%. The left  ventricle has severely decreased function. The left ventricle demonstrates  global hypokinesis. The left ventricular internal cavity size was severely  dilated. Left ventricular  diastolic function could not be evaluated.  2. Right ventricular systolic function is low normal. The right  ventricular size is normal. There is mildly elevated pulmonary artery  systolic pressure. The estimated right ventricular systolic pressure is  41.2 mmHg.  3. Left atrial size was severely dilated.  4. The mitral valve is grossly normal. Mild to moderate mitral valve  regurgitation.  5. The aortic valve is tricuspid. Aortic valve regurgitation is mild. No  aortic stenosis is present.  6. There is mild dilatation of the ascending aorta measuring 41 mm.  7. The inferior vena cava is  normal in size with <50% respiratory  variability, suggesting right atrial pressure of 8 mmHg.    Past Medical History:  Diagnosis Date  . Anteroseptal myocardial infarction [facility])    PTCA to LAD 1992  . Anxiety   . Atrial fibrillation ([place])   . BPH (benign prostatic hyperplasia)   . CHF (congestive heart failure) ([place])   . Chronic atrial fibrillation ([place])   . Essential hypertension   . GERD (gastroesophageal reflux disease)   . GI bleed    March 2017 - esophagitis  . Hiatal hernia   . History of kidney stones   . Hyperlipidemia   . Ischemic cardiomyopathy    [name] 48% 2015  . Myocardial infarction, inferior wall ([name])    BMS to proximal RCA 2004 at [facility]  . Sleep apnea    On CPAP    Past Surgical History:  Procedure Laterality Date  . CARDIOVERSION N/A 06/30/2014   Procedure: CARDIOVERSION;  Surgeon: Sanda Klein, MD;  Location: [facility] ENDOSCOPY;  Service: Cardiovascular;  Laterality: N/A;  . ESOPHAGOGASTRODUODENOSCOPY Left 02/14/2016   Procedure: ESOPHAGOGASTRODUODENOSCOPY (EGD);  Surgeon: Wonda Horner, MD;  Location: [facility];  Service: Endoscopy;  Laterality: Left;  . EYE SURGERY Bilateral   . TONSILLECTOMY    . TOTAL KNEE ARTHROPLASTY     right    Allergies  Allergen Reactions  . Iodinated Diagnostic Agents     unknown  . Lipitor [Atorvastatin Calcium]     unknown  . Sulfa Drugs Cross Reactors     unknown   (Not in a hospital admission)  Family History  Problem Relation Age of Onset  . Heart attack Father   . Stroke Mother   . Cancer Mother        colon  . Heart attack Brother 56  . Cancer Brother        prostate  . Cancer Sister        colon  . Parkinson's disease Sister   . Stroke Maternal Grandmother   . Cancer Daughter        unknown origin   . GI problems Son     Social History   Socioeconomic History  . Marital status: Widowed    Spouse name: Not on file  . Number of children: 2  . Years of education: Not on file  . Highest education  level: Not on file  Occupational History  . Occupation: retired    Comment: 1999- [employer]  Tobacco Use  . Smoking status: Never Smoker  . Smokeless tobacco: Never Used  Substance and Sexual Activity  . Alcohol use: Yes    Alcohol/week: 0.0 standard drinks    Comment: occasionally drink  . Drug use: No  . Sexual activity: Not on file  Other Topics Concern  . Not on file  Social History Narrative   Lives alone ( 2 children - not local)    Social Determinants of Health   Financial Resource Strain:   . Difficulty of Paying Living Expenses:   Food Insecurity:   . Worried About Charity fundraiser in the Last Year:   . Ran  Out of Food in the Last Year:   Transportation Needs:   . Lack of Transportation (Medical):   [name] Lack of Transportation (Non-Medical):   Physical Activity:   . Days of Exercise per Week:   . Minutes of Exercise per Session:   Stress:   . Feeling of Stress :   Social Connections:   . Frequency of Communication with Friends and Family:   . Frequency of Social Gatherings with Friends and Family:   . Attends Religious Services:   . Active Member of Clubs or Organizations:   . Attends Archivist Meetings:   [name] Marital Status:   Intimate Partner Violence:   . Fear of Current or Ex-Partner:   . Emotionally Abused:   [name] Physically Abused:   . Sexually Abused:      Review of Systems: [y] = yes, [ ]  = no       General: Weight gain [] ; Weight loss [ ] ; Anorexia [ ] ; Fatigue [ ] ; Fever [ ] ; Chills [ ] ; Weakness [ ]     Cardiac: Chest pain/pressure [ ] ; Resting SOB [ y ]; Exertional SOB [ ] ; Orthopnea [ y  ]; Pedal Edema [ y  ]; Palpitations [ ] ; Syncope [ ] ; Presyncope [ ] ; Paroxysmal nocturnal dyspnea[y  ]    Pulmonary: Cough [ ] ; Wheezing[ ] ; Hemoptysis[ ] ; Sputum [ ] ; Snoring [ ]     GI: Vomiting[ ] ; Dysphagia[ ] ; Melena[ ] ; Hematochezia [ ] ; Heartburn[ ] ; Abdominal pain [ ] ; Constipation [ ] ; Diarrhea [ ] ; BRBPR [ ]     GU: Hematuria[ ] ; Dysuria [ ] ;  Nocturia[ ]   Vascular: Pain in legs with walking [ ] ; Pain in feet with lying flat [ ] ; Non-healing sores [ ] ; Stroke [ ] ; TIA [ ] ; Slurred speech [ ] ;    Neuro: Headaches[ ] ; Vertigo[ ] ; Seizures[ ] ; Paresthesias[ ] ;Blurred vision [ ] ; Diplopia [ ] ; Vision changes [ ]     Ortho/Skin: Arthritis [ ] ; Joint pain [ ] ; Muscle pain [ ] ; Joint swelling [ ] ; Back Pain [ ] ; Rash [ ]     Psych: Depression[ ] ; Anxiety[ ]     Heme: Bleeding problems [ ] ; Clotting disorders [ ] ; Anemia [ ]     Endocrine: Diabetes [ ] ; Thyroid dysfunction[ ]   Physical Exam: Blood pressure 125/83, pulse 97, temperature 98 F (36.7 C), temperature source Oral, resp. rate 14, height 5\' 7"  (1.702 m), weight 64.9 kg, SpO2 96 %.   GENERAL: Patient is afebrile, Vital signs reviewed, Well appearing, Patient appears comfortable, Alert and lucid, thin, elderly gentleman EYES: Normal inspection. HEENT:  normocephalic, atraumatic , normal ENT inspection. ORAL:  Moist NECK:  supple , normal inspection. CARD:  Irregularly irregular rhythm, no murmurs, JVD ~12cms (angle of mandible) RESP:   Decreased breath sounds on the right base, and crackles+ ABD: soft, non tender to palpation , BS present, soft, no organomegaly or masses . BACK: non-tender. No CVA tenderness. MUSC:  normal ROM, non-tender , 3+ b/l pedal edema . SKIN: color normal, no rash, warm, dry . NEURO: awake & alert, lucid, no motor/sensory deficit. Gait stable. PSYCH: mood/affect normal.  Labs: Lab Results  Component Value Date   BUN 31 (H) 04/10/2020   Lab Results  Component Value Date   CREATININE 1.14 03/27/2020   Lab Results  Component Value Date   NA 138 04/01/2020   K 3.7 03/29/2020   CL 94 (L) 03/30/2020   CO2 30 03/28/2020   No results found for: TROPONINI  Lab Results  Component Value Date   WBC 10.5 04/01/2020   HGB 15.0 03/17/2020   HCT 45.8 03/28/2020   MCV 101.1 (H) 03/13/2020   PLT 178 03/19/2020   Lab Results  Component Value Date     CHOL 115 02/06/2020   HDL 38 (L) 02/06/2020   LDLCALC 63 02/06/2020   TRIG 71 02/06/2020   CHOLHDL 3.0 02/06/2020   Lab Results  Component Value Date   ALT 76 (H) 04/04/2020   AST 41 03/14/2020   ALKPHOS 46 04/03/2020   BILITOT 1.5 (H) 03/29/2020      Radiology:   CXR:  IMPRESSION: Large hiatal hernia.  Mild vascular congestion.  Small right pleural effusion and bibasilar atelectasis.     Signed: Renae Fickle 03/17/2020, 5:06 PM

## 2020-04-03 ENCOUNTER — Inpatient Hospital Stay: Payer: Self-pay

## 2020-04-03 ENCOUNTER — Inpatient Hospital Stay (HOSPITAL_COMMUNITY): Payer: Medicare Other

## 2020-04-03 DIAGNOSIS — J9601 Acute respiratory failure with hypoxia: Secondary | ICD-10-CM

## 2020-04-03 LAB — BASIC METABOLIC PANEL
Anion gap: 9 (ref 5–15)
BUN: 27 mg/dL — ABNORMAL HIGH (ref 8–23)
CO2: 34 mmol/L — ABNORMAL HIGH (ref 22–32)
Calcium: 8.3 mg/dL — ABNORMAL LOW (ref 8.9–10.3)
Chloride: 95 mmol/L — ABNORMAL LOW (ref 98–111)
Creatinine, Ser: 1.31 mg/dL — ABNORMAL HIGH (ref 0.61–1.24)
GFR calc Af Amer: 57 mL/min — ABNORMAL LOW (ref >60)
GFR calc non Af Amer: 49 mL/min — ABNORMAL LOW (ref >60)
Glucose, Bld: 127 mg/dL — ABNORMAL HIGH (ref 70–99)
Potassium: 3.3 mmol/L — ABNORMAL LOW (ref 3.5–5.1)
Sodium: 138 mmol/L (ref 135–145)

## 2020-04-03 LAB — URINALYSIS, ROUTINE W REFLEX MICROSCOPIC
Bilirubin Urine: NEGATIVE
Glucose, UA: NEGATIVE mg/dL
Hgb urine dipstick: NEGATIVE
Ketones, ur: NEGATIVE mg/dL
Nitrite: NEGATIVE
Protein, ur: NEGATIVE mg/dL
Specific Gravity, Urine: 1.009 (ref 1.005–1.030)
pH: 6 (ref 5.0–8.0)

## 2020-04-03 LAB — MAGNESIUM: Magnesium: 1.9 mg/dL (ref 1.7–2.4)

## 2020-04-03 LAB — MRSA PCR SCREENING: MRSA by PCR: NEGATIVE

## 2020-04-03 MED ORDER — SODIUM CHLORIDE 0.9% FLUSH
10.0000 mL | Freq: Two times a day (BID) | INTRAVENOUS | Status: DC
  Administered 2020-04-04: 10 mL
  Administered 2020-04-04: 20 mL
  Administered 2020-04-05: 10 mL

## 2020-04-03 MED ORDER — MAGNESIUM OXIDE 400 (241.3 MG) MG PO TABS
200.0000 mg | ORAL_TABLET | Freq: Two times a day (BID) | ORAL | Status: DC
  Administered 2020-04-03 – 2020-04-05 (×5): 200 mg via ORAL
  Filled 2020-04-03 (×5): qty 1

## 2020-04-03 MED ORDER — CHLORHEXIDINE GLUCONATE CLOTH 2 % EX PADS
6.0000 | MEDICATED_PAD | Freq: Every day | CUTANEOUS | Status: DC
  Administered 2020-04-04: 6 via TOPICAL

## 2020-04-03 MED ORDER — DOPAMINE-DEXTROSE 3.2-5 MG/ML-% IV SOLN
0.0000 ug/kg/min | INTRAVENOUS | Status: DC
  Administered 2020-04-03: 5 ug/kg/min via INTRAVENOUS
  Filled 2020-04-03: qty 250

## 2020-04-03 MED ORDER — SODIUM CHLORIDE 0.9% FLUSH: 10.0000 mL | INTRAVENOUS | Status: DC | PRN

## 2020-04-03 NOTE — Progress Notes (Signed)
PROGRESS NOTE    Henry Ayala  [id number] DOB: Nov 25, 1930 DOA: 03/24/2020 PCP: Henry Norlander, DO   Brief Narrative: 86 year old with past medical history significant for A. fib on anticoagulation, CAD prior history of MI, chronic systolic heart failure with ejection fraction 15 to 20% by echo 01/2020 with global hypokinesis of the left ventricle, patient presents with continued shortness of breath since 4 days prior to admission.  Patient reports shortness of breath on exertion and significant orthopnea.  Evaluation in the ED chest x-ray showed pulmonary congestion, systolic blood pressure has been in the 90s.  Patient received IV Lexis.  Cardiology has been consulted to help with management of heart failure.  BNP 1200, troponin 46.   Assessment & Plan:   Principal Problem:   Acute respiratory failure with hypoxia ([name]) Active Problems:   GERD (gastroesophageal reflux disease)   BPH (benign prostatic hyperplasia)   Chronic atrial fibrillation ([name])   Benign essential HTN   Anticoagulant long-term use   CAD (coronary artery disease)   Acute on chronic systolic CHF (congestive heart failure) ([name])   1-Acute hypoxic respiratory failure\secondary to systolic heart failure exacerbation pulmonary edema. -Appreciate cardiology evaluation.  Plan for PICC line and Inotropic.  Continue with lasix.  Still requiring oxygen supplementation.   2-Acute on chronic systolic heart failure -Continue with Lasix 40 mg IV twice daily Plan for PICC line and Inotropic.  Weight; 143--136  3-CAD: per cardiology BB and eliquis  4-A. Fib Continue with metoprolol Continue with Eliquis  5-BPH: Continue with Cardura  6-GERD Continue with PPI    Estimated body mass index is 21.39 kg/m as calculated from the following:   Height as of this encounter: 5\' 7"  (1.702 m).   Weight as of this encounter: 62 kg.   DVT prophylaxis: Eliquis Code Status:  Partial code, No intubation, CPR  or defibrillation, Only medications. Discussed with patient and daughter  Family Communication: Daughter at bedside Disposition Plan:  Status is: Inpatient  Remains inpatient appropriate because:IV treatments appropriate due to intensity of illness or inability to take PO   Dispo: The patient is from: Home              Anticipated d/c is to: Home              Anticipated d/c date is: 2 days              Patient currently is not medically stable to d/c.      Consultants:   Cardiology  Procedures:   None  Antimicrobials:  None  Subjective: He is alert, answer question, breathing better than yesterday, but still weak, tired.  Per daughter he has been sleeping sofa, due to inability to sleep flat. He has been more weak, leaning on his walker to ambulate.   Objective: Vitals:   03/29/2020 2000 03/31/2020 2106 04/03/20 0007 04/03/20 0409  BP: 101/78 103/75 108/90 95/62  Pulse: 100 90 62 99  Resp: 19 20 20 16   Temp:  98 F (36.7 C) 98 F (36.7 C) 97.6 F (36.4 C)  TempSrc:  Oral Oral Oral  SpO2: 93% 99% 95% 97%  Weight:  62.1 kg  62 kg  Height:  5\' 7"  (1.702 m)      Intake/Output Summary (Last 24 hours) at 04/03/2020 0722 Last data filed at 04/03/2020 [id number] Gross per 24 hour  Intake 480 ml  Output 350 ml  Net 130 ml   Filed Weights   04/11/2020 1204 03/31/2020 2106  04/03/20 0409  Weight: 64.9 kg 62.1 kg 62 kg    Examination:  General exam: chronic ill appearing  Respiratory system: crackles bilaterally.  Cardiovascular system: S1 & S2 heard, systolic murmur Gastrointestinal system: Abdomen is nondistended, soft and nontender. No organomegaly or masses felt. Normal bowel sounds heard. Central nervous system: Alert and oriented.  Extremities: trace edema ankle     Data Reviewed: I have personally reviewed following labs and imaging studies  CBC: Recent Labs  Lab 04/03/2020 1348  WBC 10.5  NEUTROABS 8.6*  HGB 15.0  HCT 45.8  MCV 101.1*  PLT 0000000   Basic  Metabolic Panel: Recent Labs  Lab 03/15/2020 1348  NA 138  K 3.7  CL 94*  CO2 30  GLUCOSE 106*  BUN 31*  CREATININE 1.14  CALCIUM 8.6*   GFR: Estimated Creatinine Clearance: 40.8 mL/min (by C-G formula based on SCr of 1.14 mg/dL). Liver Function Tests: Recent Labs  Lab 03/17/2020 1348  AST 41  ALT 76*  ALKPHOS 46  BILITOT 1.5*  PROT 5.8*  ALBUMIN 3.2*   No results for input(s): LIPASE, AMYLASE in the last 168 hours. No results for input(s): AMMONIA in the last 168 hours. Coagulation Profile: No results for input(s): INR, PROTIME in the last 168 hours. Cardiac Enzymes: No results for input(s): CKTOTAL, CKMB, CKMBINDEX, TROPONINI in the last 168 hours. BNP (last 3 results) No results for input(s): PROBNP in the last 8760 hours. HbA1C: No results for input(s): HGBA1C in the last 72 hours. CBG: No results for input(s): GLUCAP in the last 168 hours. Lipid Profile: No results for input(s): CHOL, HDL, LDLCALC, TRIG, CHOLHDL, LDLDIRECT in the last 72 hours. Thyroid Function Tests: No results for input(s): TSH, T4TOTAL, FREET4, T3FREE, THYROIDAB in the last 72 hours. Anemia Panel: No results for input(s): VITAMINB12, FOLATE, FERRITIN, TIBC, IRON, RETICCTPCT in the last 72 hours. Sepsis Labs: No results for input(s): PROCALCITON, LATICACIDVEN in the last 168 hours.  Recent Results (from the past 240 hour(s))  SARS Coronavirus 2 by RT PCR (hospital order, performed in [facility] hospital lab) Nasopharyngeal Nasopharyngeal Swab     Status: None   Collection Time: 03/21/2020  2:48 PM   Specimen: Nasopharyngeal Swab  Result Value Ref Range Status   SARS Coronavirus 2 NEGATIVE NEGATIVE Final    Comment: (NOTE) SARS-CoV-2 target nucleic acids are NOT DETECTED. The SARS-CoV-2 RNA is generally detectable in upper and lower respiratory specimens during the acute phase of infection. The lowest concentration of SARS-CoV-2 viral copies this assay can detect is 250 copies / mL. A  negative result does not preclude SARS-CoV-2 infection and should not be used as the sole basis for treatment or other patient management decisions.  A negative result may occur with improper specimen collection / handling, submission of specimen other than nasopharyngeal swab, presence of viral mutation(s) within the areas targeted by this assay, and inadequate number of viral copies (<250 copies / mL). A negative result must be combined with clinical observations, patient history, and epidemiological information. Fact Sheet for Patients:   StrictlyIdeas.no Fact Sheet for Healthcare Providers: BankingDealers.co.za This test is not yet approved or cleared  by the [place] FDA and has been authorized for detection and/or diagnosis of SARS-CoV-2 by FDA under an Emergency Use Authorization (EUA).  This EUA will remain in effect (meaning this test can be used) for the duration of the COVID-19 declaration under Section 564(b)(1) of the Act, 21 U.S.C. section 360bbb-3(b)(1), unless the authorization is terminated or revoked  sooner. Performed at [facility], [address] [place], [place] [address]   MRSA PCR Screening     Status: None   Collection Time: 03/17/2020  9:42 PM   Specimen: Nasal Mucosa; Nasopharyngeal  Result Value Ref Range Status   MRSA by PCR NEGATIVE NEGATIVE Final    Comment:        The GeneXpert MRSA Assay (FDA approved for NASAL specimens only), is one component of a comprehensive MRSA colonization surveillance program. It is not intended to diagnose MRSA infection nor to guide or monitor treatment for MRSA infections. Performed at [facility], [place] [address], [place], [place] [address]          Radiology Studies: DG Chest Portable 1 View  Result Date: 03/12/2020 CLINICAL DATA:  Hypotension EXAM: PORTABLE CHEST 1 VIEW COMPARISON:  02/04/2019 FINDINGS: Cardiac shadow is enlarged. Aortic calcifications  are again seen. Pleural fluid is noted on the right and stable from the prior exam. Large hiatal hernia is noted. Mild bibasilar atelectasis is seen. Mild vascular congestion is noted. IMPRESSION: Large hiatal hernia. Mild vascular congestion. Small right pleural effusion and bibasilar atelectasis. Electronically Signed   By: Inez Catalina M.D.   On: 03/30/2020 13:24        Scheduled Meds: . apixaban  5 mg Oral BID  . doxazosin  2 mg Oral Daily  . furosemide  40 mg Intravenous BID  . metoprolol succinate  12.5 mg Oral Daily  . mirtazapine  7.5 mg Oral QHS  . pantoprazole  40 mg Oral BID  . potassium chloride  40 mEq Oral Daily  . pravastatin  40 mg Oral q1800  . sodium chloride flush  3 mL Intravenous Q12H  . spironolactone  25 mg Oral Daily   Continuous Infusions: . sodium chloride       LOS: 1 day    Time spent: 35 minutes.     Elmarie Shiley, MD [facility]   If 7PM-7AM, please contact night-coverage [email or web address]  04/03/2020, 7:22 AM

## 2020-04-03 NOTE — Discharge Instructions (Signed)

## 2020-04-03 NOTE — TOC Initial Note (Signed)
Transition of Care [facility]) - Initial/Assessment Note    Patient Details  Name: Henry Ayala MRN: [id number] Date of Birth: 09/11/1931  Transition of Care [facility]) CM/SW Contact:    Bartholomew Crews, RN Phone Number: [phone] 04/03/2020, 3:39 PM  Clinical Narrative:                  Spoke with patient's daughter, Rolla Flatten, on the phone [phone] to discuss plans for transition home. Patient moved into an assisted living environment. He now lives at [facility], [address] [place], [place],  [address].   Discussed need for hospital bed. [name] had been working on getting hospital bed with the assisted living. [name] provided contact information for Clinical biochemist at [facility], [phone].   Spoke with Tye Maryland who had been working on getting an order, but stated that an order from the hospital would be helpful. DME order faxed to [phone]. Tye Maryland to get hospital bed from [facility].   Discussed that patient was on oxygen in the hospital, if oxygen is needed after discharge, arrangements will be made through [facility]   Patient is currently followed at ALF by [facility] for PT and OT. [facility] orders can be faxed to facility to be forwarded to [facility].   Patient may be transported back to [facility] in private vehicle if medically able, however, if he needs oxygen at discharge, will likely need ambulance transport.   Daughter updated after call to [facility].   TOC team following for transition needs.   Expected Discharge Plan: Assisted Living Barriers to Discharge: Continued Medical Work up   Patient Goals and CMS Choice Patient states their goals for this hospitalization and ongoing recovery are:: plan to return to [facility] CMS Medicare.gov Compare Post Acute Care list provided to:: Patient Represenative (must comment)(daughter, [name]) Choice offered to / list presented to : Adult Children  Expected Discharge Plan and  Services Expected Discharge Plan: Assisted Living In-house Referral: Clinical Social Work Discharge Planning Services: CM Consult Post Acute Care Choice: Durable Medical Equipment, Home Health Living arrangements for the past 2 months: [place]                 DME Arranged: Hospital bed DME Agency: [place]       HH Arranged: PT, OT [facility] Agency: [place]        Prior Living Arrangements/Services Living arrangements for the past 2 months: [place] Lives with:: Facility Resident          Need for Family Participation in Patient Care: Yes (Comment) Care giver support system in place?: Yes (comment) Current home services: DME(walker) Criminal Activity/Legal Involvement Pertinent to Current Situation/Hospitalization: No - Comment as needed  Activities of Daily Living Home Assistive Devices/Equipment: Environmental consultant (specify type) ADL Screening (condition at time of admission) Patient's cognitive ability adequate to safely complete daily activities?: Yes Is the patient deaf or have difficulty hearing?: Yes Does the patient have difficulty seeing, even when wearing glasses/contacts?: No Does the patient have difficulty concentrating, remembering, or making decisions?: No Patient able to express need for assistance with ADLs?: Yes Does the patient have difficulty dressing or bathing?: No Independently performs ADLs?: Yes (appropriate for developmental age) Does the patient have difficulty walking or climbing stairs?: No Weakness of Legs: None Weakness of Arms/Hands: None  Permission Sought/Granted                  Emotional Assessment  Orientation: : Oriented to Self Alcohol / Substance Use: Not Applicable Psych Involvement: No (comment)  Admission diagnosis:  Acute respiratory failure with hypoxia ([name]) [J96.01] Acute on chronic congestive heart failure, unspecified heart failure type [facility]) [I50.9] Patient Active  Problem List   Diagnosis Date Noted  . Acute respiratory failure with hypoxia ([place]) 04/07/2020  . Acute on chronic systolic CHF (congestive heart failure) ([place]) 02/04/2020  . SOB (shortness of breath) 01/29/2020  . Sleep disturbance 01/29/2020  . Chronic systolic congestive heart failure ([place]) 09/05/2017  . Neoplasm of uncertain behavior of skin 01/25/2017  . Depression 07/19/2016  . CAD (coronary artery disease) 05/09/2016  . Diaphragmatic hernia without obstruction and without gangrene 05/09/2016  . Memory loss 04/17/2016  . Normocytic anemia 03/12/2016  . Upper GI bleed 02/12/2016  . Benign essential HTN 02/12/2016  . Anticoagulant long-term use 02/12/2016  . Chronic atrial fibrillation ([place]) 04/28/2014  . Inguinal hernia 04/28/2014  . Sleep apnea 10/26/2013  . Ischemic heart disease 04/27/2011  . Hypercholesterolemia 04/27/2011  . GERD (gastroesophageal reflux disease) 04/27/2011  . BPH (benign prostatic hyperplasia) 04/27/2011   PCP:  [name], DO Pharmacy:   [facility] [address], [address] [place] [address] Phone: [phone] Fax: [phone], [place], [place] [address] Phone: [phone] Fax: [phone]     Social Determinants of Health (SDOH) Interventions    Readmission Risk Interventions No flowsheet data found.

## 2020-04-03 NOTE — Plan of Care (Signed)
  Problem: Education: Goal: Knowledge of General Education information will improve Description Including pain rating scale, medication(s)/side effects and non-pharmacologic comfort measures Outcome: Progressing   Problem: Health Behavior/Discharge Planning: Goal: Ability to manage health-related needs will improve Outcome: Progressing   Problem: Clinical Measurements: Goal: Respiratory complications will improve Outcome: Progressing   Problem: Safety: Goal: Ability to remain free from injury will improve Outcome: Progressing   

## 2020-04-03 NOTE — Progress Notes (Signed)
Picc Line place by IV team awaiting xray to confirm placement, report given to night RN to call MD on call for milrinone order.

## 2020-04-03 NOTE — Progress Notes (Signed)
Progress Note  Patient Name: Henry Ayala Date of Encounter: 04/03/2020  Primary Cardiologist:   Rozann Lesches, MD   Subjective   He is somnolent and fatigued but answers questions.  Breathing is not improved  Inpatient Medications    Scheduled Meds: . apixaban  5 mg Oral BID  . doxazosin  2 mg Oral Daily  . furosemide  40 mg Intravenous BID  . metoprolol succinate  12.5 mg Oral Daily  . mirtazapine  7.5 mg Oral QHS  . pantoprazole  40 mg Oral BID  . potassium chloride  40 mEq Oral Daily  . pravastatin  40 mg Oral q1800  . sodium chloride flush  3 mL Intravenous Q12H  . spironolactone  25 mg Oral Daily   Continuous Infusions: . sodium chloride     PRN Meds: sodium chloride, acetaminophen, LORazepam, ondansetron (ZOFRAN) IV, sodium chloride flush   Vital Signs    Vitals:   03/22/2020 2106 04/03/20 0007 04/03/20 0409 04/03/20 0732  BP: 103/75 108/90 95/62 91/64   Pulse: 90 62 99 96  Resp: 20 20 16 20   Temp: 98 F (36.7 C) 98 F (36.7 C) 97.6 F (36.4 C)   TempSrc: Oral Oral Oral   SpO2: 99% 95% 97%   Weight: 62.1 kg  62 kg   Height: 5\' 7"  (1.702 m)       Intake/Output Summary (Last 24 hours) at 04/03/2020 0826 Last data filed at 04/03/2020 [id number] Gross per 24 hour  Intake 480 ml  Output 350 ml  Net 130 ml   Filed Weights   03/22/2020 1204 03/25/2020 2106 04/03/20 0409  Weight: 64.9 kg 62.1 kg 62 kg    Telemetry    Atrial fib, rate OK.   - Personally Reviewed  ECG    NA - Personally Reviewed  Physical Exam   GEN: No acute distress.   Neck:   Positive  JVD Cardiac: RRR, no murmurs, rubs, or gallops.  Respiratory:      Decreased breath sounds with bilateral basilar crackles.  GI: Soft, nontender, non-distended  MS: No  edema; No deformity. Neuro:  Nonfocal  Psych: Normal affect   Labs    Chemistry Recent Labs  Lab 03/13/2020 1348  NA 138  K 3.7  CL 94*  CO2 30  GLUCOSE 106*  BUN 31*  CREATININE 1.14  CALCIUM 8.6*  PROT 5.8*    ALBUMIN 3.2*  AST 41  ALT 76*  ALKPHOS 46  BILITOT 1.5*  GFRNONAA 58*  GFRAA >60  ANIONGAP 14     Hematology Recent Labs  Lab 03/20/2020 1348  WBC 10.5  RBC 4.53  HGB 15.0  HCT 45.8  MCV 101.1*  MCH 33.1  MCHC 32.8  RDW 15.1  PLT 178    Cardiac EnzymesNo results for input(s): TROPONINI in the last 168 hours. No results for input(s): TROPIPOC in the last 168 hours.   BNP Recent Labs  Lab 04/11/2020 1348  BNP 1,210.0*     DDimer No results for input(s): DDIMER in the last 168 hours.   Radiology    DG Chest Portable 1 View  Result Date: 03/17/2020 CLINICAL DATA:  Hypotension EXAM: PORTABLE CHEST 1 VIEW COMPARISON:  02/04/2019 FINDINGS: Cardiac shadow is enlarged. Aortic calcifications are again seen. Pleural fluid is noted on the right and stable from the prior exam. Large hiatal hernia is noted. Mild bibasilar atelectasis is seen. Mild vascular congestion is noted. IMPRESSION: Large hiatal hernia. Mild vascular congestion. Small right pleural effusion and  bibasilar atelectasis. Electronically Signed   By: Inez Catalina M.D.   On: 04/01/2020 13:24    Cardiac Studies   ECHO:  02/05/20  1. Left ventricular ejection fraction, by estimation, is 15-20%. The left  ventricle has severely decreased function. The left ventricle demonstrates  global hypokinesis. The left ventricular internal cavity size was severely  dilated. Left ventricular  diastolic function could not be evaluated.  2. Right ventricular systolic function is low normal. The right  ventricular size is normal. There is mildly elevated pulmonary artery  systolic pressure. The estimated right ventricular systolic pressure is  [id number] mmHg.  3. Left atrial size was severely dilated.  4. The mitral valve is grossly normal. Mild to moderate mitral valve  regurgitation.  5. The aortic valve is tricuspid. Aortic valve regurgitation is mild. No  aortic stenosis is present.  6. There is mild dilatation of the  ascending aorta measuring 41 mm.  7. The inferior vena cava is normal in size with <50% respiratory  variability, suggesting right atrial pressure of 8 mmHg.    Patient Profile     86 y.o. male 86 y.o.malewith a history of CAD s/p PTCA of LAD in 1992 and BMS to RCA in [id number], chronic systolic CHF/ischemic cardiomyopathy with EF of 25-30% in 08/2017, chronic atrial fibrillation on Eliquis, sleep apnea, hypertension, hyperlipidemia, GERD, and BPHadmitted with ofacute on chronic CHF (Decompensated Heart failure). Cardiology is consulted for management of heart failure.  Assessment & Plan    ACUTE ON CHRONIC SYSTOLIC HF:    Intake and output is incomplete secondary to transfer from [facility].    BP is low precluding further med titration at this point.   I spoke with the patient and his daughter and the primary MD.  He will be a meds only, DNR.  I will order a PICC line and he might need inotropic therapy.   Looks like low output failure wet and warm right now.   CAD:   No evidence of acute coronary syndrome.     PERSISTENT ATRIAL FIB:  On Eliquis and his rate is controlled.  Continue current therapy.    For questions or updates, please contact [place] Please consult [email or web address] for contact info under Cardiology/STEMI.   Signed, Minus Breeding, MD  04/03/2020, 8:26 AM

## 2020-04-03 NOTE — Progress Notes (Signed)
   04/03/20 2147  Assess: MEWS Score  BP (!) 82/64  Pulse Rate 69  ECG Heart Rate 84  Resp (!) 24  SpO2 98 %  Assess: MEWS Score  MEWS Temp 0  MEWS Systolic 1  MEWS Pulse 0  MEWS RR 1  MEWS LOC 0  MEWS Score 2  MEWS Score Color Yellow  Assess: if the MEWS score is Yellow or Red  Were vital signs taken at a resting state? Yes  Focused Assessment Documented focused assessment  Early Detection of Sepsis Score *See Row Information* Low  MEWS guidelines implemented *See Row Information* Yes  Treat  MEWS Interventions Administered scheduled meds/treatments (pt just had Lasix earlier, on Dopamine now to help with BP )  Take Vital Signs  Increase Vital Sign Frequency  Yellow: Q 2hr X 2 then Q 4hr X 2, if remains yellow, continue Q 4hrs  Document  Patient Outcome Other (Comment) (pt on Dopamine now for BP)

## 2020-04-03 NOTE — Progress Notes (Signed)
    Durable Medical Equipment  (From admission, onward)         Start     Ordered   04/03/20 1231  For home use only DME Hospital bed  Once    Question Answer Comment  Length of Need 12 Months   The above medical condition requires: Patient requires the ability to reposition frequently   Head must be elevated greater than: 45 degrees   Bed type Semi-electric      04/03/20 1230

## 2020-04-03 NOTE — Progress Notes (Signed)
PICC order received.  Spoke with Tai, RN regarding placement.  Made aware that PICC placement will most likely be 04-04-20.

## 2020-04-03 NOTE — Progress Notes (Signed)
See iv team note MD notified.

## 2020-04-03 NOTE — Progress Notes (Signed)
Peripherally Inserted Central Catheter Placement  The IV Nurse has discussed with the patient and/or persons authorized to consent for the patient, the purpose of this procedure and the potential benefits and risks involved with this procedure.  The benefits include less needle sticks, lab draws from the catheter, and the patient may be discharged home with the catheter. Risks include, but not limited to, infection, bleeding, blood clot (thrombus formation), and puncture of an artery; nerve damage and irregular heartbeat and possibility to perform a PICC exchange if needed/ordered by physician.  Alternatives to this procedure were also discussed.  Bard Power PICC patient education guide, fact sheet on infection prevention and patient information card has been provided to patient /or left at bedside.  Telephone consent obtained from daughter.  PICC Placement Documentation  PICC Double Lumen 04/03/20 PICC Right Brachial 39 cm 0 cm (Active)  Indication for Insertion or Continuance of Line Prolonged intravenous therapies;Chronic illness with exacerbations (CF, Sickle Cell, etc.) 04/03/20 1911  Exposed Catheter (cm) 0 cm 04/03/20 1911  Site Assessment Clean;Dry;Intact 04/03/20 1911  Lumen #1 Status Flushed;Saline locked;Blood return noted 04/03/20 1911  Lumen #2 Status Flushed;Saline locked;Blood return noted 04/03/20 1911  Dressing Type Transparent 04/03/20 1911  Dressing Status Clean;Dry;Intact;Antimicrobial disc in place 04/03/20 1911  Safety [place] Not Applicable [id number]  Line Care Connections checked and tightened 04/03/20 1911  Line Adjustment (NICU/IV Team Only) No 04/03/20 1911  Dressing Intervention New dressing 04/03/20 1911  Dressing Change Due 04/10/20 04/03/20 1911       Rolena Infante 04/03/2020, 7:12 PM

## 2020-04-04 DIAGNOSIS — R570 Cardiogenic shock: Secondary | ICD-10-CM

## 2020-04-04 DIAGNOSIS — Z7189 Other specified counseling: Secondary | ICD-10-CM

## 2020-04-04 DIAGNOSIS — I2510 Atherosclerotic heart disease of native coronary artery without angina pectoris: Secondary | ICD-10-CM

## 2020-04-04 DIAGNOSIS — I5022 Chronic systolic (congestive) heart failure: Secondary | ICD-10-CM

## 2020-04-04 DIAGNOSIS — E876 Hypokalemia: Secondary | ICD-10-CM

## 2020-04-04 DIAGNOSIS — Z515 Encounter for palliative care: Secondary | ICD-10-CM

## 2020-04-04 DIAGNOSIS — N183 Chronic kidney disease, stage 3 unspecified: Secondary | ICD-10-CM

## 2020-04-04 DIAGNOSIS — I482 Chronic atrial fibrillation, unspecified: Secondary | ICD-10-CM

## 2020-04-04 LAB — BASIC METABOLIC PANEL
Anion gap: 15 (ref 5–15)
BUN: 22 mg/dL (ref 8–23)
CO2: 34 mmol/L — ABNORMAL HIGH (ref 22–32)
Calcium: 8.5 mg/dL — ABNORMAL LOW (ref 8.9–10.3)
Chloride: 90 mmol/L — ABNORMAL LOW (ref 98–111)
Creatinine, Ser: 1.27 mg/dL — ABNORMAL HIGH (ref 0.61–1.24)
GFR calc Af Amer: 59 mL/min — ABNORMAL LOW (ref >60)
GFR calc non Af Amer: 51 mL/min — ABNORMAL LOW (ref >60)
Glucose, Bld: 156 mg/dL — ABNORMAL HIGH (ref 70–99)
Potassium: 3.4 mmol/L — ABNORMAL LOW (ref 3.5–5.1)
Sodium: 139 mmol/L (ref 135–145)

## 2020-04-04 LAB — COOXEMETRY PANEL
Carboxyhemoglobin: 2 % — ABNORMAL HIGH (ref 0.5–1.5)
Methemoglobin: 1.1 % (ref 0.0–1.5)
O2 Saturation: 61.5 %
Total hemoglobin: 15.1 g/dL (ref 12.0–16.0)

## 2020-04-04 LAB — CBC WITH DIFFERENTIAL/PLATELET
Abs Immature Granulocytes: 0.03 10*3/uL (ref 0.00–0.07)
Basophils Absolute: 0 10*3/uL (ref 0.0–0.1)
Basophils Relative: 0 %
Eosinophils Absolute: 0.2 10*3/uL (ref 0.0–0.5)
Eosinophils Relative: 2 %
HCT: 44.7 % (ref 39.0–52.0)
Hemoglobin: 14.6 g/dL (ref 13.0–17.0)
Immature Granulocytes: 0 %
Lymphocytes Relative: 7 %
Lymphs Abs: 0.6 10*3/uL — ABNORMAL LOW (ref 0.7–4.0)
MCH: 33.4 pg (ref 26.0–34.0)
MCHC: 32.7 g/dL (ref 30.0–36.0)
MCV: 102.3 fL — ABNORMAL HIGH (ref 80.0–100.0)
Monocytes Absolute: 0.6 10*3/uL (ref 0.1–1.0)
Monocytes Relative: 7 %
Neutro Abs: 7.3 10*3/uL (ref 1.7–7.7)
Neutrophils Relative %: 84 %
Platelets: 160 10*3/uL (ref 150–400)
RBC: 4.37 MIL/uL (ref 4.22–5.81)
RDW: 14.7 % (ref 11.5–15.5)
WBC: 8.7 10*3/uL (ref 4.0–10.5)
nRBC: 0 % (ref 0.0–0.2)

## 2020-04-04 MED ORDER — MORPHINE SULFATE (PF) 2 MG/ML IV SOLN
1.0000 mg | INTRAVENOUS | Status: DC | PRN
  Administered 2020-04-05: 1 mg via INTRAVENOUS
  Filled 2020-04-04: qty 1

## 2020-04-04 MED ORDER — HALOPERIDOL LACTATE 5 MG/ML IJ SOLN
2.0000 mg | Freq: Four times a day (QID) | INTRAMUSCULAR | Status: DC | PRN
  Administered 2020-04-04 – 2020-04-05 (×2): 2 mg via INTRAVENOUS
  Filled 2020-04-04 (×2): qty 1

## 2020-04-04 MED ORDER — SODIUM CHLORIDE 0.9 % IV SOLN
1.0000 g | INTRAVENOUS | Status: DC
  Administered 2020-04-04 – 2020-04-05 (×2): 1 g via INTRAVENOUS
  Filled 2020-04-04: qty 10

## 2020-04-04 MED ORDER — MORPHINE SULFATE (CONCENTRATE) 10 MG/0.5ML PO SOLN
2.5000 mg | ORAL | Status: DC | PRN
  Administered 2020-04-05: 5 mg via SUBLINGUAL
  Filled 2020-04-04: qty 0.5

## 2020-04-04 MED ORDER — BISACODYL 10 MG RE SUPP: 10.0000 mg | Freq: Every day | RECTAL | Status: DC | PRN

## 2020-04-04 MED ORDER — SORBITOL 70 % SOLN: 30.0000 mL | Freq: Every day | Status: DC | PRN

## 2020-04-04 MED ORDER — FUROSEMIDE 10 MG/ML IJ SOLN
40.0000 mg | Freq: Once | INTRAMUSCULAR | Status: AC
  Administered 2020-04-04: 40 mg via INTRAVENOUS
  Filled 2020-04-04: qty 4

## 2020-04-04 NOTE — Progress Notes (Signed)
Pt with episode of A fib with RVR in the 180s-190s at rest. EKG performed and in chart. O2 temporarily increased to 4L when O2 sat dropped to 83%. MD paged.   Pt currently in A fib with HR 110s-120s. BP 87/57.

## 2020-04-04 NOTE — Progress Notes (Signed)
Consults called to AHF team and Palliative Medicine. Tylee Yum PA-C

## 2020-04-04 NOTE — Progress Notes (Signed)
PT Cancellation Note  Patient Details Name: Henry Ayala MRN: [id number] DOB: 1931-03-03   Cancelled Treatment:    Reason Eval/Treat Not Completed: Medical issues which prohibited therapy. PT attempted to see x3 today, but the pt was not medically ready. Upon talking to the RN this afternoon, PT was informed the pt has transitioned to comfort care and no longer needs PT consult. PT will sign off, thank you for the consult.   Karma Ganja, PT, DPT   Acute Rehabilitation Department Pager #: [phone]   Otho Bellows 04/04/2020, 1:55 PM

## 2020-04-04 NOTE — Consult Note (Addendum)
Advanced Heart Failure Team Consult Note   Primary Physician: [name], DO PCP-Cardiologist:  Rozann Lesches, MD  Reason for Consultation: Heart Failure   HPI:    Henry Ayala is seen today for evaluation of heart failure at the request of Dr Johnsie Cancel.   Henry Schoch is a 86 year old with a history of CAD S/P PTCA of LAD 1992 and BMS RCA in 2004, chronic a fib on eliquis, OSA, HTN, hyperlipidemia, GERD, BPH, dementia, and chronic systolic heart failure.   Admitted 02/04/20 with increased shortness of breath and lower extremity edema. Diuresed with IV lasix and transitioned to 40 mg po lasix. Discharge weight was 139 pounds.   [facility]ed to [facility] from SNF with increased shortness of breath and hypotension. CXR with small r pleural effusion and mild vascular congestion.  Metoprolol was cut back 12.5 mg daily.  Pertinent admission labs: creatinine 1.14, K 3.7, total bili 1.5, WBC 10.5,  HGb 15, and BNP 1210. UA with rare bacteria and moderate leukocytes. Started on ceftriaxone. Cardiology consulted for HF management. Started on IV lasix and continued on home meds.   Had PICC line placed due to concern for low output. CO-OX 61.5%.  Overnight hypotensive so dopamine was started. HF Team consulted for further recommendations.  His daughter is at the bedside and said he has very limited memory and not very conversant. He does not recall recent hospitalization. He does not remember where he is at. He will answer a few questions.   Denies pain.   Echo 2021 EF 15-20%  Echo 2018 EF 25-30%   Review of Systems: [y] = yes, [ ]  = no  The patient has dementia therefore the history was obtained by his daughter, [name] and the chart.   . General: Weight gain [ ] ; Weight loss [ ] ; Anorexia [ ] ; Fatigue [ ] ; Fever [ ] ; Chills [ ] ; Weakness [ ]   . Cardiac: Chest pain/pressure [ ] ; Resting SOB [ ] ; Exertional SOB [ ] ; Orthopnea [ ] ; Pedal Edema [ ] ; Palpitations [ ] ; Syncope [ ] ;  Presyncope [ ] ; Paroxysmal nocturnal dyspnea[ ]   . Pulmonary: Cough [ ] ; Wheezing[ ] ; Hemoptysis[ ] ; Sputum [ ] ; Snoring [ ]   . GI: Vomiting[ ] ; Dysphagia[ ] ; Melena[ ] ; Hematochezia [ ] ; Heartburn[ ] ; Abdominal pain [ ] ; Constipation [ ] ; Diarrhea [ ] ; BRBPR [ ]   . GU: Hematuria[ ] ; Dysuria [ ] ; Nocturia[ ]   . Vascular: Pain in legs with walking [ ] ; Pain in feet with lying flat [ ] ; Non-healing sores [ ] ; Stroke [ ] ; TIA [ ] ; Slurred speech [ ] ;  . Neuro: Headaches[ ] ; Vertigo[ ] ; Seizures[ ] ; Paresthesias[ ] ;Blurred vision [ ] ; Diplopia [ ] ; Vision changes [ ]   . Ortho/Skin: Arthritis [ ] ; Joint pain [ ] ; Muscle pain [ ] ; Joint swelling [ ] ; Back Pain [ ] ; Rash [ ]   . Psych: Depression[ ] ; Anxiety[ ]   . Heme: Bleeding problems [ ] ; Clotting disorders [ ] ; Anemia [ ]   . Endocrine: Diabetes [ ] ; Thyroid dysfunction[ ]   Home Medications Prior to Admission medications   Medication Sig Start Date End Date Taking? Authorizing Gilliam Hawkes  acetaminophen (TYLENOL) 500 MG tablet Take 500 mg by mouth every 6 (six) hours as needed for mild pain or headache.   Yes Meliza Kage, Historical, MD  apixaban (ELIQUIS) 5 MG TABS tablet Take 1 tablet (5 mg total) by mouth 2 (two) times daily. 09/15/19  Yes [name], DO  doxazosin (CARDURA) 2 MG tablet Take 1 tablet (2 mg total) by mouth daily. 09/15/19  Yes Gottschalk, Leatrice Jewels M, DO  furosemide (LASIX) 40 MG tablet Take 1 tablet (40 mg total) by mouth daily. 02/10/20  Yes Hoyt Koch, MD  lovastatin (MEVACOR) 40 MG tablet Take 1 tablet (40 mg total) by mouth daily. 09/15/19  Yes Ronnie Doss M, DO  metoprolol succinate (TOPROL-XL) 25 MG 24 hr tablet Take 1 tablet (25 mg total) by mouth daily. 01/11/20  Yes Gottschalk, Leatrice Jewels M, DO  mirtazapine (REMERON) 7.5 MG tablet Take 1 tablet (7.5 mg total) by mouth at bedtime. 03/30/20  Yes Gottschalk, Leatrice Jewels M, DO  Multiple Vitamins-Minerals (THEREMS-M) TABS Take 1 tablet by mouth daily.   Yes Donnette Macmullen,  Historical, MD  pantoprazole (PROTONIX) 40 MG tablet Take 1 tablet (40 mg total) by mouth 2 (two) times daily. 05/26/19  Yes Ronnie Doss M, DO  spironolactone (ALDACTONE) 25 MG tablet Take 1 tablet (25 mg total) by mouth daily. 09/15/19  Yes Gottschalk, Ashly M, DO  LORazepam (ATIVAN) 0.5 MG tablet Take 0.5 tablets (0.25 mg total) by mouth at bedtime as needed for anxiety (agitation). Patient not taking: Reported on 04/01/2020 02/09/20 02/08/21  Hoyt Koch, MD    Past Medical History: Past Medical History:  Diagnosis Date  . Anteroseptal myocardial infarction [facility])    PTCA to LAD 1992  . Anxiety   . Atrial fibrillation ([place])   . BPH (benign prostatic hyperplasia)   . CHF (congestive heart failure) ([place])   . Chronic atrial fibrillation ([place])   . Essential hypertension   . GERD (gastroesophageal reflux disease)   . GI bleed    March 2017 - esophagitis  . Hiatal hernia   . History of kidney stones   . Hyperlipidemia   . Ischemic cardiomyopathy    [name] 48% 2015  . Myocardial infarction, inferior wall ([name])    BMS to proximal RCA [facility] at [facility]  . Sleep apnea    On CPAP    Past Surgical History: Past Surgical History:  Procedure Laterality Date  . CARDIOVERSION N/A 06/30/2014   Procedure: CARDIOVERSION;  Surgeon: Sanda Klein, MD;  Location: [facility] ENDOSCOPY;  Service: Cardiovascular;  Laterality: N/A;  . ESOPHAGOGASTRODUODENOSCOPY Left 02/14/2016   Procedure: ESOPHAGOGASTRODUODENOSCOPY (EGD);  Surgeon: Wonda Horner, MD;  Location: [facility];  Service: Endoscopy;  Laterality: Left;  . EYE SURGERY Bilateral   . TONSILLECTOMY    . TOTAL KNEE ARTHROPLASTY     right    Family History: Family History  Problem Relation Age of Onset  . Heart attack Father   . Stroke Mother   . Cancer Mother        colon  . Heart attack Brother 57  . Cancer Brother        prostate  . Cancer Sister        colon  . Parkinson's disease Sister   . Stroke Maternal Grandmother   .  Cancer Daughter        unknown origin   . GI problems Son     Social History: Social History   Socioeconomic History  . Marital status: Widowed    Spouse name: Not on file  . Number of children: 2  . Years of education: Not on file  . Highest education level: Not on file  Occupational History  . Occupation: retired    Comment: 1999- [employer]  Tobacco Use  . Smoking status: Never Smoker  . Smokeless tobacco: Never  Used  Substance and Sexual Activity  . Alcohol use: Yes    Alcohol/week: 0.0 standard drinks    Comment: occasionally drink  . Drug use: No  . Sexual activity: Not on file  Other Topics Concern  . Not on file  Social History Narrative   Lives alone ( 2 children - not local)    Social Determinants of Health   Financial Resource Strain:   . Difficulty of Paying Living Expenses:   Food Insecurity:   . Worried About Charity fundraiser in the Last Year:   . Arboriculturist in the Last Year:   Transportation Needs:   . Film/video editor (Medical):   [name] Lack of Transportation (Non-Medical):   Physical Activity:   . Days of Exercise per Week:   . Minutes of Exercise per Session:   Stress:   . Feeling of Stress :   Social Connections:   . Frequency of Communication with Friends and Family:   . Frequency of Social Gatherings with Friends and Family:   . Attends Religious Services:   . Active Member of Clubs or Organizations:   . Attends Archivist Meetings:   [name] Marital Status:     Allergies:  Allergies  Allergen Reactions  . Iodinated Diagnostic Agents     unknown  . Lipitor [Atorvastatin Calcium]     unknown  . Sulfa Drugs Cross Reactors     unknown    Objective:    Vital Signs:   Temp:  [97.4 F (36.3 C)-101 F (38.3 C)] 97.6 F (36.4 C) (05/24 0934) Pulse Rate:  [32-130] 95 (05/24 0934) Resp:  [15-24] 20 (05/24 0934) BP: (82-119)/(47-98) 99/47 (05/24 0934) SpO2:  [90 %-99 %] 99 % (05/24 0934) Weight:  [61.9 kg] 61.9 kg  (05/24 0427) Last BM Date: 04/01/20  Weight change: Filed Weights   03/26/2020 2106 04/03/20 0409 04/04/20 0427  Weight: 62.1 kg 62 kg 61.9 kg    Intake/Output:   Intake/Output Summary (Last 24 hours) at 04/04/2020 1133 Last data filed at 04/04/2020 0900 Gross per 24 hour  Intake 700 ml  Output 1325 ml  Net -625 ml      Physical Exam   CVP 5-6  General:  Elderly.  No resp difficulty HEENT: normal Neck: supple. JVP 5-6  Carotids 2+ bilat; no bruits. No lymphadenopathy or thyromegaly appreciated. Cor: PMI nondisplaced. Irregular rate & rhythm. No rubs, gallops or murmurs. Lungs: clear Abdomen: soft, nontender, nondistended. No hepatosplenomegaly. No bruits or masses. Good bowel sounds. Extremities: no cyanosis, clubbing, rash, edema Neuro: alert & orientedx1 , cranial nerves grossly intact. moves all 4 extremities w/o difficulty. Affect flat  Telemetry   A fib 90s   EKG   A fib 75 bom   Labs   Basic Metabolic Panel: Recent Labs  Lab 03/29/2020 1348 04/03/20 0558 04/04/20 0442  NA 138 138 139  K 3.7 3.3* 3.4*  CL 94* 95* 90*  CO2 30 34* 34*  GLUCOSE 106* 127* 156*  BUN 31* 27* 22  CREATININE 1.14 1.31* 1.27*  CALCIUM 8.6* 8.3* 8.5*  MG  --  1.9  --     Liver Function Tests: Recent Labs  Lab 04/07/2020 1348  AST 41  ALT 76*  ALKPHOS 46  BILITOT 1.5*  PROT 5.8*  ALBUMIN 3.2*   No results for input(s): LIPASE, AMYLASE in the last 168 hours. No results for input(s): AMMONIA in the last 168 hours.  CBC: Recent Labs  Lab  03/21/2020 1348 04/04/20 0953  WBC 10.5 8.7  NEUTROABS 8.6* 7.3  HGB 15.0 14.6  HCT 45.8 44.7  MCV 101.1* 102.3*  PLT 178 160    Cardiac Enzymes: No results for input(s): CKTOTAL, CKMB, CKMBINDEX, TROPONINI in the last 168 hours.  BNP: BNP (last 3 results) Recent Labs    02/04/20 2050 03/28/2020 1348  BNP 756.6* 1,210.0*    ProBNP (last 3 results) No results for input(s): PROBNP in the last 8760 hours.   CBG: No results  for input(s): GLUCAP in the last 168 hours.  Coagulation Studies: No results for input(s): LABPROT, INR in the last 72 hours.   Imaging   DG CHEST PORT 1 VIEW  Result Date: 04/03/2020 CLINICAL DATA:  Status post PICC line placement EXAM: PORTABLE CHEST 1 VIEW COMPARISON:  CT scan May 09, 2016. Chest x-ray February 04, 2020 and Apr 02, 2021. FINDINGS: Right PICC line terminates in the central SVC. The patient has a known large hiatal hernia. Previous imaging demonstrated colon herniated above the diaphragm to the right, again identified. Small pleural effusions with underlying atelectasis. No other changes. IMPRESSION: The right PICC line terminates in the central SVC, in good position. No other changes. Small pleural effusions. Herniations as above. Electronically Signed   By: Dorise Bullion III M.D   On: 04/03/2020 20:05   [place] EKG SITE RITE  Result Date: 04/03/2020 If Site Rite image not attached, placement could not be confirmed due to current cardiac rhythm.     Medications:     Current Medications: . apixaban  5 mg Oral BID  . Chlorhexidine Gluconate Cloth  6 each Topical Daily  . magnesium oxide  200 mg Oral BID  . mirtazapine  7.5 mg Oral QHS  . pantoprazole  40 mg Oral BID  . potassium chloride  40 mEq Oral Daily  . pravastatin  40 mg Oral q1800  . sodium chloride flush  10-40 mL Intracatheter Q12H  . sodium chloride flush  3 mL Intravenous Q12H     Infusions: . sodium chloride    . cefTRIAXone (ROCEPHIN)  IV    . DOPamine 5 mcg/kg/min (04/03/20 2144)       Patient Profile  Henry Ayala is a 86 year old with a history of CAD S/P PTCA of LAD 1992 and BMS RCA in 2004, chronic a fib on eliquis, OSA, HTN, hyperlipidemia, GERD, BPH, dementia, and chronic systolic heart failure.  Admitted with increased shortness of breath.    Assessment/Plan   1. Shock  Hypotensive on admit.  Possible cardiogenic versus sepsis.  CO-OX 61.5%. UA with rare bacteria/leukocytes.  Treating with ceftriaxone.  Hypotensive, started on dopamine. Anticipate stopping later today.   2. A/C Systolic Heart Failure, ICM ECHO EF 15-20% this admit which is down from previous ECHO 25-30%.   -BNP on admit elevated.  - Volume overloaded on admit. Diuresed with IV lasix and not CVP down to 5-6. Hold lasix.  - On dopamine for BP. Anticipate stopping dopamine after [place] conversation.  - No bb with hypotension.   3. UTI Started on rocephin  4. CAD  -CAD S/P PTCA of LAD 1992 and BMS RCA in 2004 HS Trop 45>46  - No chest pain.   5. Chronic A fib -Rate controlled. Off bb with hypotension.  - On eliquis.   6. Hypokalemia  - K 3.3  7. Dementia Daughter reports its getting progressively worse.   8. Goals of Care DNR/DNI Palliative Care consulted.  Anticipate transitioning to  comfort care.   Discussed with his daughter at the bedside and she would like to transition to comfort care. Palliative Care is meeting with her now.    He is not a candidate for advanced therapies given age and dementia.     Length of Stay: 2  Darrick Grinder, NP  04/04/2020, 11:33 AM  Advanced Heart Failure Team Pager [phone] (M-F; 7a - 4p)  Please contact [facility] for night-coverage after hours (4p -7a ) and weekends on [email or web address]  Patient seen and examined with the above-signed Advanced Practice Zlaty Alexa and/or Housestaff. I personally reviewed laboratory data, imaging studies and relevant notes. I independently examined the patient and formulated the important aspects of the plan. I have edited the note to reflect any of my changes or salient points. I have personally discussed the plan with the patient and/or family.  86 y/o male with CAD, chronic severe systolic HF due to iCM, dementia. Currently living in an ALF in [place]. Admitted with progressive LE edema and hypotension. Started on dopamine.   CVP now 6 and co-ox 61% on dopamine 5  Echo EF 25-30% -> now 15%. Respiratory status  stable.   Unable to provide much history.   Family has met with Palliative Care and planning d/c to [facility] after family isall here  General:  Elderly weak appearing. No resp difficulty HEENT: normal Neck: supple. JVP 6. Carotids 2+ bilat; no bruits. No lymphadenopathy or thryomegaly appreciated. Cor: PMI nondisplaced. Regular rate & rhythm  +s3 Lungs: clear Abdomen: soft, nontender, nondistended. No hepatosplenomegaly. No bruits or masses. Good bowel sounds. Extremities: no cyanosis, clubbing, rash, tr edema Neuro: confused cranial nerves grossly intact. moves all 4 extremities w/o difficulty. Affect pleasant  He is improved with dopamine and IV lasix. Discussed situation with him and his daughter.  Unfortunately not a candidate for advanced therapies or home inotropes. Agree with plans for transition to Hospice when he and family are ready. Adjust diuretics to keep comfortable.   We will sign off. Please let us know if we can assist in any way.   Glori Bickers, MD  5:56 PM

## 2020-04-04 NOTE — Progress Notes (Addendum)
PROGRESS NOTE    Henry Ayala  [id number] DOB: [birth year] DOA: 03/14/2020 PCP: [name], DO   Brief Narrative: 86 year old with past medical history significant for A. fib on anticoagulation, CAD prior history of MI, chronic systolic heart failure with ejection fraction 15 to 20% by echo 01/2020 with global hypokinesis of the left ventricle, patient presents with continued shortness of breath since 4 days prior to admission.  Patient reports shortness of breath on exertion and significant orthopnea.  Evaluation in the ED chest x-ray showed pulmonary congestion, systolic blood pressure has been in the 90s.  Patient received IV Lexis.  Cardiology has been consulted to help with management of heart failure.  BNP 1200, troponin 46.   Assessment & Plan:   Principal Problem:   Acute respiratory failure with hypoxia ([name]) Active Problems:   GERD (gastroesophageal reflux disease)   BPH (benign prostatic hyperplasia)   Chronic atrial fibrillation ([name])   Benign essential HTN   Anticoagulant long-term use   CAD (coronary artery disease)   Acute on chronic systolic CHF (congestive heart failure) ([name])   1-Acute Hypoxic respiratory failure\secondary to systolic heart failure exacerbation pulmonary edema. -Appreciate cardiology evaluation.  - Started  Inotropic, dopamine.   -lasix and spironolactone on hold due to hypotension and low CVP -moving toward full comfort care after son arrives tomorrow.   2-Acute on chronic systolic heart failure -Continue with Lasix 40 mg IV twice daily - Started  Inotropic, dopamine.   -Lasix and spironolactone on hold due to hypotension and low CVP -Moving toward full comfort care after son arrives tomorrow.  Weight; 143--136  3-CAD: per cardiology BB on hold and Continue with eliquis  4-A. Fib Continue with metoprolol Continue with Eliquis  5-BPH: Continue with Cardura  6-GERD Continue with PPI  7*Fever; ? Aspiration? UTI;  started on ceftriaxone.   Possible Cardiogenic Shock; on dobutamine.   Estimated body mass index is 21.37 kg/m as calculated from the following:   Height as of this encounter: 5\' 7"  (1.702 m).   Weight as of this encounter: 61.9 kg.   DVT prophylaxis: Eliquis Code Status:  DNR, discussed with Daughter.  Family Communication: Daughter at bedside Disposition Plan:  Status is: Inpatient  Remains inpatient appropriate because:IV treatments appropriate due to intensity of illness or inability to take PO   Dispo: The patient is from: Home              Anticipated d/c is to: residential hospice facility               Anticipated d/c date is: 2 days              Patient currently is not medically stable to d/c.      Consultants:   Cardiology  Procedures:   None  Antimicrobials:  None  Subjective: He is sleepy, lethargic. answer few questions.   Objective: Vitals:   04/04/20 0755 04/04/20 0800 04/04/20 0849 04/04/20 0934  BP:  91/67 (!) 88/69 (!) 99/47  Pulse:  61 (!) 111 95  Resp:  20  20  Temp: (!) 97.4 F (36.3 C) 97.6 F (36.4 C)  97.6 F (36.4 C)  TempSrc: Oral Oral  Axillary  SpO2:  96%  99%  Weight:      Height:        Intake/Output Summary (Last 24 hours) at 04/04/2020 1130 Last data filed at 04/04/2020 0900 Gross per 24 hour  Intake 700 ml  Output 1325 ml  Net -  625 ml   Filed Weights   03/30/2020 2106 04/03/20 0409 04/04/20 0427  Weight: 62.1 kg 62 kg 61.9 kg    Examination:  General exam: Chronic ill appearing Respiratory system: Crackles bases Cardiovascular system: S ,1, S 2 Systolic Murmur Gastrointestinal system: BS present, soft, nt Central nervous system: sleepy Extremities: trace edema     Data Reviewed: I have personally reviewed following labs and imaging studies  CBC: Recent Labs  Lab 03/22/2020 1348 04/04/20 0953  WBC 10.5 8.7  NEUTROABS 8.6* 7.3  HGB 15.0 14.6  HCT 45.8 44.7  MCV 101.1* 102.3*  PLT 178 0000000   Basic  Metabolic Panel: Recent Labs  Lab 04/04/2020 1348 04/03/20 0558 04/04/20 0442  NA 138 138 139  K 3.7 3.3* 3.4*  CL 94* 95* 90*  CO2 30 34* 34*  GLUCOSE 106* 127* 156*  BUN 31* 27* 22  CREATININE 1.14 1.31* 1.27*  CALCIUM 8.6* 8.3* 8.5*  MG  --  1.9  --    GFR: Estimated Creatinine Clearance: 36.6 mL/min (A) (by C-G formula based on SCr of 1.27 mg/dL (H)). Liver Function Tests: Recent Labs  Lab 03/15/2020 1348  AST 41  ALT 76*  ALKPHOS 46  BILITOT 1.5*  PROT 5.8*  ALBUMIN 3.2*   No results for input(s): LIPASE, AMYLASE in the last 168 hours. No results for input(s): AMMONIA in the last 168 hours. Coagulation Profile: No results for input(s): INR, PROTIME in the last 168 hours. Cardiac Enzymes: No results for input(s): CKTOTAL, CKMB, CKMBINDEX, TROPONINI in the last 168 hours. BNP (last 3 results) No results for input(s): PROBNP in the last 8760 hours. HbA1C: No results for input(s): HGBA1C in the last 72 hours. CBG: No results for input(s): GLUCAP in the last 168 hours. Lipid Profile: No results for input(s): CHOL, HDL, LDLCALC, TRIG, CHOLHDL, LDLDIRECT in the last 72 hours. Thyroid Function Tests: No results for input(s): TSH, T4TOTAL, FREET4, T3FREE, THYROIDAB in the last 72 hours. Anemia Panel: No results for input(s): VITAMINB12, FOLATE, FERRITIN, TIBC, IRON, RETICCTPCT in the last 72 hours. Sepsis Labs: No results for input(s): PROCALCITON, LATICACIDVEN in the last 168 hours.  Recent Results (from the past 240 hour(s))  SARS Coronavirus 2 by RT PCR (hospital order, performed in [facility] hospital lab) Nasopharyngeal Nasopharyngeal Swab     Status: None   Collection Time: 04/08/2020  2:48 PM   Specimen: Nasopharyngeal Swab  Result Value Ref Range Status   SARS Coronavirus 2 NEGATIVE NEGATIVE Final    Comment: (NOTE) SARS-CoV-2 target nucleic acids are NOT DETECTED. The SARS-CoV-2 RNA is generally detectable in upper and lower respiratory specimens during the  acute phase of infection. The lowest concentration of SARS-CoV-2 viral copies this assay can detect is 250 copies / mL. A negative result does not preclude SARS-CoV-2 infection and should not be used as the sole basis for treatment or other patient management decisions.  A negative result may occur with improper specimen collection / handling, submission of specimen other than nasopharyngeal swab, presence of viral mutation(s) within the areas targeted by this assay, and inadequate number of viral copies (<250 copies / mL). A negative result must be combined with clinical observations, patient history, and epidemiological information. Fact Sheet for Patients:   StrictlyIdeas.no Fact Sheet for Healthcare Providers: BankingDealers.co.za This test is not yet approved or cleared  by the [place] FDA and has been authorized for detection and/or diagnosis of SARS-CoV-2 by FDA under an Emergency Use Authorization (EUA).  This EUA  will remain in effect (meaning this test can be used) for the duration of the COVID-19 declaration under Section 564(b)(1) of the Act, 21 U.S.C. section 360bbb-3(b)(1), unless the authorization is terminated or revoked sooner. Performed at [facility], [address], [place], [place] [address]   MRSA PCR Screening     Status: None   Collection Time: 03/16/2020  9:42 PM   Specimen: Nasal Mucosa; Nasopharyngeal  Result Value Ref Range Status   MRSA by PCR NEGATIVE NEGATIVE Final    Comment:        The GeneXpert MRSA Assay (FDA approved for NASAL specimens only), is one component of a comprehensive MRSA colonization surveillance program. It is not intended to diagnose MRSA infection nor to guide or monitor treatment for MRSA infections. Performed at [facility], [place] [address], [place], [place] [address]          Radiology Studies: DG CHEST PORT 1 VIEW  Result Date: 04/03/2020 CLINICAL DATA:   Status post PICC line placement EXAM: PORTABLE CHEST 1 VIEW COMPARISON:  CT scan May 09, 2016. Chest x-ray February 04, 2020 and Apr 02, 2021. FINDINGS: Right PICC line terminates in the central SVC. The patient has a known large hiatal hernia. Previous imaging demonstrated colon herniated above the diaphragm to the right, again identified. Small pleural effusions with underlying atelectasis. No other changes. IMPRESSION: The right PICC line terminates in the central SVC, in good position. No other changes. Small pleural effusions. Herniations as above. Electronically Signed   By: Dorise Bullion III M.D   On: 04/03/2020 20:05   DG Chest Portable 1 View  Result Date: 04/03/2020 CLINICAL DATA:  Hypotension EXAM: PORTABLE CHEST 1 VIEW COMPARISON:  02/04/2019 FINDINGS: Cardiac shadow is enlarged. Aortic calcifications are again seen. Pleural fluid is noted on the right and stable from the prior exam. Large hiatal hernia is noted. Mild bibasilar atelectasis is seen. Mild vascular congestion is noted. IMPRESSION: Large hiatal hernia. Mild vascular congestion. Small right pleural effusion and bibasilar atelectasis. Electronically Signed   By: Inez Catalina M.D.   On: 03/31/2020 13:24   [place] EKG SITE RITE  Result Date: 04/03/2020 If Site Rite image not attached, placement could not be confirmed due to current cardiac rhythm.       Scheduled Meds: . apixaban  5 mg Oral BID  . Chlorhexidine Gluconate Cloth  6 each Topical Daily  . magnesium oxide  200 mg Oral BID  . mirtazapine  7.5 mg Oral QHS  . pantoprazole  40 mg Oral BID  . potassium chloride  40 mEq Oral Daily  . pravastatin  40 mg Oral q1800  . sodium chloride flush  10-40 mL Intracatheter Q12H  . sodium chloride flush  3 mL Intravenous Q12H   Continuous Infusions: . sodium chloride    . cefTRIAXone (ROCEPHIN)  IV    . DOPamine 5 mcg/kg/min (04/03/20 2144)     LOS: 2 days    Time spent: 35 minutes.     Elmarie Shiley, MD [facility]   If 7PM-7AM, please contact night-coverage [email or web address]  04/04/2020, 11:30 AM

## 2020-04-04 NOTE — Progress Notes (Addendum)
Progress Note  Patient Name: Henry Ayala Date of Encounter: 04/04/2020  Primary Cardiologist: Rozann Lesches, MD  Subjective   Breathing feels normal, no complaints. No chest pain. Knows he's at a facility owned by [facility], [facility]. Toula Moos at bedside.  BP 82/64 overnight, prompting initiation of dopamine.  CVPs listed as 0 (5am), 0 (6am), 2 (7am), 5 (8am). Co-ox panel with O2 saturation of 61.5.  Inpatient Medications    Scheduled Meds: . apixaban  5 mg Oral BID  . Chlorhexidine Gluconate Cloth  6 each Topical Daily  . doxazosin  2 mg Oral Daily  . furosemide  40 mg Intravenous BID  . magnesium oxide  200 mg Oral BID  . metoprolol succinate  12.5 mg Oral Daily  . mirtazapine  7.5 mg Oral QHS  . pantoprazole  40 mg Oral BID  . potassium chloride  40 mEq Oral Daily  . pravastatin  40 mg Oral q1800  . sodium chloride flush  10-40 mL Intracatheter Q12H  . sodium chloride flush  3 mL Intravenous Q12H  . spironolactone  25 mg Oral Daily   Continuous Infusions: . sodium chloride    . DOPamine 5 mcg/kg/min (04/03/20 2144)   PRN Meds: sodium chloride, acetaminophen, LORazepam, ondansetron (ZOFRAN) IV, sodium chloride flush, sodium chloride flush   Vital Signs    Vitals:   04/04/20 0600 04/04/20 0700 04/04/20 0755 04/04/20 0800  BP: (!) 83/62 (!) 92/56  91/67  Pulse: 70 85  61  Resp: 15   20  Temp:   (!) 97.4 F (36.3 C) 97.6 F (36.4 C)  TempSrc:   Oral Oral  SpO2: 90% 93%  96%  Weight:      Height:        Intake/Output Summary (Last 24 hours) at 04/04/2020 0842 Last data filed at 04/04/2020 0400 Gross per 24 hour  Intake 580 ml  Output 1325 ml  Net -745 ml   Last 3 Weights 04/04/2020 04/03/2020 03/27/2020  Weight (lbs) 136 lb 7.4 oz 136 lb 9.6 oz 136 lb 12.8 oz  Weight (kg) 61.9 kg 61.961 kg 62.052 kg     Telemetry    Atrial fibrillation with tachycardia HR 100-120s - Personally Reviewed  Physical Exam   GEN: No acute distress, frail,  elderly HEENT: Normocephalic, atraumatic, sclera non-icteric. Neck: No JVD or bruits. Cardiac: Irregularly irregular, tachycardic, no murmurs, rubs, or gallops.  Radials/DP/PT 1+ and equal bilaterally.  Respiratory: Coarse BS to auscultation bilaterally. Breathing is unlabored. GI: Soft, nontender, non-distended, BS +x 4. MS: no deformity. Extremities: No clubbing or cyanosis. No edema. Distal pedal pulses are 2+ and equal bilaterally. Neuro:  A+O to self, nephew, knows he's at a [facility] facility and 2021 but not month. Follows commands. Psych:  Calm affect  Labs    High Sensitivity Troponin:   Recent Labs  Lab 03/23/2020 1348 04/10/2020 1624  TROPONINIHS 46* 45*      Cardiac EnzymesNo results for input(s): TROPONINI in the last 168 hours. No results for input(s): TROPIPOC in the last 168 hours.   Chemistry Recent Labs  Lab 03/16/2020 1348 04/03/20 0558 04/04/20 0442  NA 138 138 139  K 3.7 3.3* 3.4*  CL 94* 95* 90*  CO2 30 34* 34*  GLUCOSE 106* 127* 156*  BUN 31* 27* 22  CREATININE 1.14 1.31* 1.27*  CALCIUM 8.6* 8.3* 8.5*  PROT 5.8*  --   --   ALBUMIN 3.2*  --   --   AST 41  --   --  ALT 76*  --   --   ALKPHOS 46  --   --   BILITOT 1.5*  --   --   GFRNONAA 58* 49* 51*  GFRAA >60 57* 59*  ANIONGAP 14 9 15      Hematology Recent Labs  Lab 04/10/2020 1348  WBC 10.5  RBC 4.53  HGB 15.0  HCT 45.8  MCV 101.1*  MCH 33.1  MCHC 32.8  RDW 15.1  PLT 178    BNP Recent Labs  Lab 03/26/2020 1348  BNP 1,210.0*     DDimer No results for input(s): DDIMER in the last 168 hours.   Radiology    DG CHEST PORT 1 VIEW  Result Date: 04/03/2020 CLINICAL DATA:  Status post PICC line placement EXAM: PORTABLE CHEST 1 VIEW COMPARISON:  CT scan May 09, 2016. Chest x-ray February 04, 2020 and Apr 02, 2021. FINDINGS: Right PICC line terminates in the central SVC. The patient has a known large hiatal hernia. Previous imaging demonstrated colon herniated above the diaphragm to the right,  again identified. Small pleural effusions with underlying atelectasis. No other changes. IMPRESSION: The right PICC line terminates in the central SVC, in good position. No other changes. Small pleural effusions. Herniations as above. Electronically Signed   By: Dorise Bullion III M.D   On: 04/03/2020 20:05   DG Chest Portable 1 View  Result Date: 04/04/2020 CLINICAL DATA:  Hypotension EXAM: PORTABLE CHEST 1 VIEW COMPARISON:  02/04/2019 FINDINGS: Cardiac shadow is enlarged. Aortic calcifications are again seen. Pleural fluid is noted on the right and stable from the prior exam. Large hiatal hernia is noted. Mild bibasilar atelectasis is seen. Mild vascular congestion is noted. IMPRESSION: Large hiatal hernia. Mild vascular congestion. Small right pleural effusion and bibasilar atelectasis. Electronically Signed   By: Inez Catalina M.D.   On: 04/11/2020 13:24   [place] EKG SITE RITE  Result Date: 04/03/2020 If Site Rite image not attached, placement could not be confirmed due to current cardiac rhythm.   Cardiac Studies   2D echo 02/05/20 IMPRESSIONS  1. Left ventricular ejection fraction, by estimation, is 15-20%. The left  ventricle has severely decreased function. The left ventricle demonstrates  global hypokinesis. The left ventricular internal cavity size was severely  dilated. Left ventricular  diastolic function could not be evaluated.  2. Right ventricular systolic function is low normal. The right  ventricular size is normal. There is mildly elevated pulmonary artery  systolic pressure. The estimated right ventricular systolic pressure is  [id number] mmHg.  3. Left atrial size was severely dilated.  4. The mitral valve is grossly normal. Mild to moderate mitral valve  regurgitation.  5. The aortic valve is tricuspid. Aortic valve regurgitation is mild. No  aortic stenosis is present.  6. There is mild dilatation of the ascending aorta measuring 41 mm.  7. The inferior vena cava is  normal in size with <50% respiratory  variability, suggesting right atrial pressure of 8 mmHg.    Patient Profile     86 y.o. male with CAD s/p PTCA of LAD in 1992 and BMS to RCA in [id number], chronic systolic CHF/ischemic cardiomyopathy (EF 15-20% in 01/2020), chronic atrial fibrillation on Eliquis, LAFB/LBBB, sleep apnea, hypertension, dementia, hyperlipidemia, GERD, CKD stage III by labs, and BPH. He was admitted to the hospital from SNF with SOB x 1 week and LE edema, diagnosed with acute on chronic systolic CHF. He has history of this, with low blood pressure limiting his prior medication titration.  Assessment & Plan    1. Acute on chronic systolic CHF/ICM and hypoxia requiring supplemental O2 - weight 143-136 (but remaining about this the last 3 days). CVPs are relatively low and co-ox panel O2 sat is OK. Blood pressure remains quite low (has been issue in the past as well) - now on dopamine. Per d/w Dr. Johnsie Cancel will hold BB/spironolactone/Lasix and involve Advanced HF team as well as Palliative Care for goals of care.  2. CAD - elevated hsTroponin low/flat likely due to demand ischemia, not ACS. Not on ASA due to Eliquis. Continue conservative approach.  3. Chronic atrial fibrillation - rates were relatively controlled but now uptrending on context of dopamine. Will review with MD. Continue Eliquis but follow weight/Cr carefully for dosing.  4. CKD stage III - baseline Cr appears 1.2-1.35. Relatively stable.  5. Fever of 101F yesterday afternoon - further per primary team, normal temp since then.  6. Hypokalemia - potassium repletion ordered. Holding further Lasix as above.  For questions or updates, please contact [place] Please consult [email or web address] for contact info under Cardiology/STEMI.  Signed, Charlie Pitter, PA-C 04/04/2020, 8:42 AM    Patient with dementia and advanced age and CHF. Discussed care with nephew Merry Proud and patient Daughter coming in latter today . Per Dr  Percival Spanish had PICC placed and per fellow last night put on dopamine. I presume BP too low for milrinone Baseline coox 61.5 % EF 15% by echo with mild to moderate MR.  On dopamine he is more tachycardic with PVC;s Exam with basilar crackles JVP elevated MR murmur apex that is enlarged and displaced Patient appears to be more of a palliative care patient to [facility] and I. BP too low for milrinone and dopamine making him more arrhythmic No end point for inotropic Rx  Will ask CHF/Palliative care to weigh in hold diuretic and beta blocker for now given hypotension   Jenkins Rouge MD [facility]

## 2020-04-04 NOTE — Plan of Care (Signed)
  Problem: Pain Managment: Goal: General experience of comfort will improve Outcome: Completed/Met

## 2020-04-04 NOTE — Progress Notes (Signed)
Pt was having low BP's,, MD on call notified to start Milrinone, MD wanted to start Dopamine instead due to low BP's; pt was started on Dopamine at 66mcgs, will continue to monitor, Thanks Arvella Nigh RN.

## 2020-04-04 NOTE — Progress Notes (Signed)
Spoke with daughter Cherlynn Perches She was wanting to speak with palliative/hospice anyway She is in agreement with me that iv inotropes probably not indicated She wants to make him comfortable and get him back to assisted living Mostly Rx excess fluid with diuretics. Will let palliative / CHF see today likely stop dopamine latter today   Jenkins Rouge MD [facility]

## 2020-04-04 NOTE — Consult Note (Addendum)
Consultation Note Date: 04/04/2020   Patient Name: Henry Ayala  DOB: 04-29-1931  MRN: [id number]  Age / Sex: 86 y.o., male  PCP: [name], DO Referring Physician: Elmarie Shiley, MD  Reason for Consultation: Establishing goals of care  HPI/Patient Profile: 86 y.o. male  with past medical history of CHF EF 15-20%, CAD, atrial fibrillation on anticoagulation, HTN, cognitive impairment and underlying confusion, GERD, BPH admitted from ALF on 03/23/2020 with shortness of breath related to acute on chronic heart failure requiring diuresis. He is now requiring vasopressor support for hypotension and has had fever 101 F 5/23 but temp normal prior to 101 fever and since. Being treated for UTI.   Clinical Assessment and Goals of Care: I have reviewed records in electronic chart. Noted initial conversations not to pursue invasive therapies.   I met today at Henry Ayala's bedside. Heart failure team finishing up at bedside and reports CVP low and fluid status controlled. Daughter/HCPOA, [name], present at bedside and called her brother, [name], who joined conversation via speaker phone. Henry Ayala is lethargic and sleeps through most of our conversation. Family reports baseline confusion, insomnia, agitation that was initially noted ~2 months ago. Large decline since beginning of the year. He has only been living at ALF for ~5 weeks now. We discussed his gradual decline and overall poor prognosis. Discussed the unpredictable nature of heart failure but likely abrupt decline over days to weeks with transition to focus on comfort. We discussed palliative vs hospice services and hospice options. They would like to pursue hospice facility at [facility].   [name] is scheduled to arrive from [place] 5/25 evening. Plans to continue current therapies until son can arrive. Will go ahead and connect patient/family with  hospice in anticipation for d/c to facility in the coming days.   All questions/concerns addressed. Emotional support provided. Discussed with [name].   Primary Decision Maker HCPOA daughter [name]    SUMMARY OF RECOMMENDATIONS   - Continue current therapies but focus on comfort as priority. - Son arriving tomorrow evening and will de-escalate and stop dopamine after he arrives with anticipation of transitioning to hospice facility.   Code Status/Advance Care Planning:  DNR   Symptom Management:   Agitation: Continue mirtazapine. D/C ativan as per daughter this seemed to cause paradoxical effect. Haldol 2 mg IV every 6 hours as needed.   SOB/severe pain: low dose morphine sublingual vs IV as needed.   Sorbitol and dulcolax supp as needed for constipation.   Palliative Prophylaxis:   Bowel Regimen, Delirium Protocol, Frequent Pain Assessment and Turn Reposition  Psycho-social/Spiritual:   Desire for further Chaplaincy support:yes  Additional Recommendations: Education on Hospice and Grief/Bereavement Support  Prognosis:   Overall prognosis poor with steep decline over weeks to months. Anticipate < 2 weeks once transition off aggressive measures.   Discharge Planning: Hospice facility      Primary Diagnoses: Present on Admission: . Acute respiratory failure with hypoxia ([place]) . Acute on chronic systolic CHF (congestive heart failure) ([place]) .  Benign essential HTN . BPH (benign prostatic hyperplasia) . CAD (coronary artery disease) . Chronic atrial fibrillation ([name]) . GERD (gastroesophageal reflux disease)   I have reviewed the medical record, interviewed the patient and family, and examined the patient. The following aspects are pertinent.  Past Medical History:  Diagnosis Date  . Anteroseptal myocardial infarction [name])    PTCA to LAD 1992  . Anxiety   . Atrial fibrillation ([name])   . BPH (benign prostatic hyperplasia)   . CHF (congestive heart  failure) ([name])   . Chronic atrial fibrillation ([name])   . Essential hypertension   . GERD (gastroesophageal reflux disease)   . GI bleed    March 2017 - esophagitis  . Hiatal hernia   . History of kidney stones   . Hyperlipidemia   . Ischemic cardiomyopathy    [name] 48% 2015  . Myocardial infarction, inferior wall ([name])    [facility] to proximal RCA [name]  . Sleep apnea    On CPAP   Social History   Socioeconomic History  . Marital status: Widowed    Spouse name: Not on file  . Number of children: 2  . Years of education: Not on file  . Highest education level: Not on file  Occupational History  . Occupation: retired    Comment: 1999- [employer]  Tobacco Use  . Smoking status: Never Smoker  . Smokeless tobacco: Never Used  Substance and Sexual Activity  . Alcohol use: Yes    Alcohol/week: 0.0 standard drinks    Comment: occasionally drink  . Drug use: No  . Sexual activity: Not on file  Other Topics Concern  . Not on file  Social History Narrative   Lives alone ( 2 children - not local)    Social Determinants of Health   Financial Resource Strain:   . Difficulty of Paying Living Expenses:   Food Insecurity:   . Worried About Charity fundraiser in the Last Year:   . Arboriculturist in the Last Year:   Transportation Needs:   . Film/video editor (Medical):   [name] Lack of Transportation (Non-Medical):   Physical Activity:   . Days of Exercise per Week:   . Minutes of Exercise per Session:   Stress:   . Feeling of Stress :   Social Connections:   . Frequency of Communication with Friends and Family:   . Frequency of Social Gatherings with Friends and Family:   . Attends Religious Services:   . Active Member of Clubs or Organizations:   . Attends Archivist Meetings:   [name] Marital Status:    Family History  Problem Relation Age of Onset  . Heart attack Father   . Stroke Mother   . Cancer Mother        colon  . Heart attack Brother 6  . Cancer  Brother        prostate  . Cancer Sister        colon  . Parkinson's disease Sister   . Stroke Maternal Grandmother   . Cancer Daughter        unknown origin   . GI problems Son    Scheduled Meds: . apixaban  5 mg Oral BID  . Chlorhexidine Gluconate Cloth  6 each Topical Daily  . doxazosin  2 mg Oral Daily  . magnesium oxide  200 mg Oral BID  . mirtazapine  7.5 mg Oral QHS  . pantoprazole  40 mg  Oral BID  . potassium chloride  40 mEq Oral Daily  . pravastatin  40 mg Oral q1800  . sodium chloride flush  10-40 mL Intracatheter Q12H  . sodium chloride flush  3 mL Intravenous Q12H   Continuous Infusions: . sodium chloride    . DOPamine 5 mcg/kg/min (04/03/20 2144)   PRN Meds:.sodium chloride, acetaminophen, LORazepam, ondansetron (ZOFRAN) IV, sodium chloride flush, sodium chloride flush Allergies  Allergen Reactions  . Iodinated Diagnostic Agents     unknown  . Lipitor [Atorvastatin Calcium]     unknown  . Sulfa Drugs Cross Reactors     unknown   Review of Systems  Constitutional: Positive for activity change, appetite change and fatigue.  Respiratory: Negative for cough and shortness of breath.   Neurological: Positive for weakness.    Physical Exam Vitals and nursing note reviewed.  Constitutional:      General: He is sleeping. He is not in acute distress.    Appearance: He is ill-appearing.  Cardiovascular:     Rate and Rhythm: Normal rate.  Pulmonary:     Effort: No tachypnea, accessory muscle usage or respiratory distress.  Abdominal:     General: Abdomen is flat.  Neurological:     Comments: Awakens towards the end of my visit with baseline confusion but answers basic questions appropriately     Vital Signs: BP (!) 99/47 (BP Location: Left Arm)   Pulse 95   Temp 97.6 F (36.4 C) (Axillary)   Resp 20   Ht _0  (1.702 m)   Wt 61.9 kg   SpO2 99%   BMI 21.37 kg/m  Pain Scale: 0-10   Pain Score: 0-No pain   SpO2: SpO2: 99 % O2 Device:SpO2: 99  % O2 Flow Rate: .O2 Flow Rate (L/min): 2 L/min  IO: Intake/output summary:   Intake/Output Summary (Last 24 hours) at 04/04/2020 1034 Last data filed at 04/04/2020 0900 Gross per 24 hour  Intake 700 ml  Output 1325 ml  Net -625 ml    LBM: Last BM Date: 04/01/20 Baseline Weight: Weight: 64.9 kg Most recent weight: Weight: 61.9 kg     Palliative Assessment/Data:     Time In: 1200 Time Out: 1310 Time Total: 70 min Greater than 50%  of this time was spent counseling and coordinating care related to the above assessment and plan.  Signed by: Vinie Sill, NP Palliative Medicine Team Pager # [phone] (M-F 8a-5p) Team Phone # [phone] (Nights/Weekends)

## 2020-04-04 NOTE — Progress Notes (Signed)
   04/04/20 0934  Assess: MEWS Score  Temp 97.6 F (36.4 C)  BP (!) 99/47  Pulse Rate 95  ECG Heart Rate 97  Resp 20  SpO2 99 %  O2 Device Nasal Cannula  Patient Activity (if Appropriate) In bed  O2 Flow Rate (L/min) 2 L/min  Assess: MEWS Score  MEWS Temp 0  MEWS Systolic 1  MEWS Pulse 0  MEWS RR 0  MEWS LOC 0  MEWS Score 1  MEWS Score Color Green  Assess: if the MEWS score is Yellow or Red  Were vital signs taken at a resting state? Yes  Focused Assessment Documented focused assessment  MEWS guidelines implemented *See Row Information* Yes  Treat  MEWS Interventions Administered scheduled meds/treatments  Take Vital Signs  Increase Vital Sign Frequency  Yellow: Q 2hr X 2 then Q 4hr X 2, if remains yellow, continue Q 4hrs  Document  Patient Outcome Stabilized after interventions (BP improving)  Progress note created (see row info) Yes   - MEWS score fired yellow due to low BP; Pt. Already on Dopamine drip and AM BP meds were held - Pt has hx. Of Afib.,scheduled Eliquis given

## 2020-04-05 ENCOUNTER — Telehealth: Payer: Medicare Other | Admitting: Student

## 2020-04-05 LAB — BASIC METABOLIC PANEL
Anion gap: 12 (ref 5–15)
BUN: 23 mg/dL (ref 8–23)
CO2: 32 mmol/L (ref 22–32)
Calcium: 8.2 mg/dL — ABNORMAL LOW (ref 8.9–10.3)
Chloride: 93 mmol/L — ABNORMAL LOW (ref 98–111)
Creatinine, Ser: 1.1 mg/dL (ref 0.61–1.24)
GFR calc Af Amer: >60 mL/min (ref >60)
GFR calc non Af Amer: >60 mL/min (ref >60)
Glucose, Bld: 144 mg/dL — ABNORMAL HIGH (ref 70–99)
Potassium: 3.9 mmol/L (ref 3.5–5.1)
Sodium: 137 mmol/L (ref 135–145)

## 2020-04-05 MED ORDER — IPRATROPIUM-ALBUTEROL 0.5-2.5 (3) MG/3ML IN SOLN
3.0000 mL | RESPIRATORY_TRACT | Status: DC | PRN
  Administered 2020-04-05: 3 mL via RESPIRATORY_TRACT
  Filled 2020-04-05: qty 3

## 2020-04-05 MED ORDER — SODIUM CHLORIDE 0.9 % IV SOLN
1.0000 mg/h | INTRAVENOUS | Status: DC
  Administered 2020-04-05: 1 mg/h via INTRAVENOUS
  Filled 2020-04-05: qty 2.5

## 2020-04-05 MED ORDER — MORPHINE SULFATE (PF) 2 MG/ML IV SOLN
2.0000 mg | INTRAVENOUS | Status: DC | PRN
  Administered 2020-04-05: 3 mg via INTRAVENOUS
  Administered 2020-04-05: 2 mg via INTRAVENOUS
  Filled 2020-04-05: qty 2
  Filled 2020-04-05: qty 1

## 2020-04-05 MED ORDER — LORAZEPAM 2 MG/ML IJ SOLN
2.0000 mg | INTRAMUSCULAR | Status: DC
  Filled 2020-04-05: qty 1

## 2020-04-05 MED ORDER — HYDROMORPHONE HCL 1 MG/ML IJ SOLN: 1.0000 mg | INTRAMUSCULAR | Status: DC | PRN

## 2020-04-05 MED ORDER — HYDROMORPHONE HCL 1 MG/ML IJ SOLN
1.0000 mg | INTRAMUSCULAR | Status: AC
  Administered 2020-04-05: 1 mg via INTRAVENOUS
  Filled 2020-04-05: qty 1

## 2020-04-05 MED ORDER — LORAZEPAM 2 MG/ML IJ SOLN: 2.0000 mg | INTRAMUSCULAR | Status: DC | PRN

## 2020-04-05 MED ORDER — LORAZEPAM 2 MG/ML IJ SOLN
INTRAMUSCULAR | Status: AC
  Administered 2020-04-05: 4 mg via INTRAVENOUS
  Filled 2020-04-05: qty 2

## 2020-04-05 MED ORDER — MIRTAZAPINE 15 MG PO TABS: 15.0000 mg | ORAL_TABLET | Freq: Every day | ORAL | Status: DC

## 2020-04-05 MED ORDER — HYDROMORPHONE BOLUS VIA INFUSION
1.0000 mg | INTRAVENOUS | Status: DC | PRN
  Filled 2020-04-05: qty 1

## 2020-04-05 MED ORDER — GLYCOPYRROLATE 0.2 MG/ML IJ SOLN
0.4000 mg | Freq: Four times a day (QID) | INTRAMUSCULAR | Status: DC
  Filled 2020-04-05: qty 2

## 2020-04-05 MED ORDER — LORAZEPAM 2 MG/ML IJ SOLN
2.0000 mg | INTRAMUSCULAR | Status: AC
  Administered 2020-04-05: 2 mg via INTRAVENOUS
  Filled 2020-04-05: qty 1

## 2020-04-05 MED ORDER — MORPHINE SULFATE (CONCENTRATE) 10 MG/0.5ML PO SOLN: 5.0000 mg | ORAL | Status: DC | PRN

## 2020-04-05 MED ORDER — HYDROMORPHONE BOLUS VIA INFUSION: 1.0000 mg | INTRAVENOUS | Status: DC | PRN

## 2020-04-05 MED ORDER — LORAZEPAM 2 MG/ML IJ SOLN: 4.0000 mg | INTRAMUSCULAR | Status: AC

## 2020-04-05 MED ORDER — FENTANYL CITRATE (PF) 100 MCG/2ML IJ SOLN
50.0000 ug | Freq: Once | INTRAMUSCULAR | Status: AC
  Administered 2020-04-05: 50 ug via INTRAVENOUS
  Filled 2020-04-05: qty 2

## 2020-04-05 MED ORDER — MIDODRINE HCL 5 MG PO TABS
10.0000 mg | ORAL_TABLET | Freq: Three times a day (TID) | ORAL | Status: DC
  Administered 2020-04-05: 10 mg via ORAL
  Filled 2020-04-05: qty 2

## 2020-04-06 LAB — URINE CULTURE: Culture: >=100000 — AB

## 2020-04-07 ENCOUNTER — Ambulatory Visit: Payer: Medicare Other | Admitting: Family Medicine

## 2020-04-07 DIAGNOSIS — I2510 Atherosclerotic heart disease of native coronary artery without angina pectoris: Secondary | ICD-10-CM | POA: Diagnosis not present

## 2020-04-08 DIAGNOSIS — J90 Pleural effusion, not elsewhere classified: Secondary | ICD-10-CM

## 2020-04-08 DIAGNOSIS — R570 Cardiogenic shock: Secondary | ICD-10-CM

## 2020-04-12 NOTE — Progress Notes (Signed)
   2020/04/27 1628  Clinical Encounter Type  Visited With Patient and family together  Visit Type Initial;Spiritual support  Referral From Palliative care team  Consult/Referral To Chaplain  Spiritual Encounters  Spiritual Needs Emotional  Stress Factors  Family Stress Factors Loss  This chaplain responded to PMT referral for EOL spiritual care.  The chaplain was pastorally present with the [place], grandson, and care giver.  The chaplain listened to the Pt.'s joyful, generous legacy surrounding the family's memories.  The Pt. grew up in [place], [place] and spent his married life in [place], returning "home" in his later life. The Pt. and family have connections to [employer] and [employer] faith communities. The chaplain shared spiritual care's willingness to be present with family as the family gathers bedside.

## 2020-04-12 NOTE — Progress Notes (Signed)
Notified nurse that unit nurse pulls PICC when deceased. VU. Fran Lowes, RN VAST

## 2020-04-12 NOTE — TOC Initial Note (Signed)
Transition of Care [facility]) - Initial/Assessment Note    Patient Details  Name: Henry Ayala MRN: [id number] Date of Birth: 02-24-1931  Transition of Care [facility]) CM/SW Contact:    Alberteen Sam, LCSW Phone Number: 2020/04/30, 9:23 AM  Clinical Narrative:                  CSW has initiated residential hospice referral to [place], [place]. Lvm with [facility] with admissions at [phone], and have faxed clinicals to [phone]. Pending call back regarding bed availability for potential transition to residential hospice tomorrow 5/26.    Expected Discharge Plan: [place] Barriers to Discharge: Hospice Bed not available   Patient Goals and CMS Choice Patient states their goals for this hospitalization and ongoing recovery are:: plan to return to [facility] [facility] CMS Medicare.gov Compare Post Acute Care list provided to:: Patient Represenative (must comment)(son [facility]) Choice offered to / list presented to : Adult Children  Expected Discharge Plan and Services Expected Discharge Plan: [place] In-house Referral: Clinical Social Work Discharge Planning Services: CM Consult Post Acute Care Choice: Hospice Living arrangements for the past 2 months: [place]                 DME Arranged: Hospital bed DME Agency: [facility]       HH Arranged: PT, OT [place] Agency: [facility]        Prior Living Arrangements/Services Living arrangements for the past 2 months: Single Family Home Lives with:: Self Patient language and need for interpreter reviewed:: Yes Do you feel safe going back to the place where you live?: Yes      Need for Family Participation in Patient Care: Yes (Comment) Care giver support system in place?: Yes (comment) Current home services: DME(walker) Criminal Activity/Legal Involvement Pertinent to Current Situation/Hospitalization: No - Comment as  needed  Activities of Daily Living Home Assistive Devices/Equipment: Walker (specify type) ADL Screening (condition at time of admission) Patient's cognitive ability adequate to safely complete daily activities?: Yes Is the patient deaf or have difficulty hearing?: Yes Does the patient have difficulty seeing, even when wearing glasses/contacts?: No Does the patient have difficulty concentrating, remembering, or making decisions?: No Patient able to express need for assistance with ADLs?: Yes Does the patient have difficulty dressing or bathing?: No Independently performs ADLs?: Yes (appropriate for developmental age) Does the patient have difficulty walking or climbing stairs?: No Weakness of Legs: None Weakness of Arms/Hands: None  Permission Sought/Granted Permission sought to share information with : Case Manager, Customer service manager, Family Supports Permission granted to share information with : Yes, Verbal Permission Granted  Share Information with NAME: Vella Raring  Permission granted to share info w AGENCY: Hospice  Permission granted to share info w Relationship: son  Permission granted to share info w Contact Information: [phone]  Emotional Assessment       Orientation: : Oriented to Self Alcohol / Substance Use: Not Applicable Psych Involvement: No (comment)  Admission diagnosis:  Acute respiratory failure with hypoxia ([place]) [J96.01] Acute on chronic congestive heart failure, unspecified heart failure type [facility]) [I50.9] Patient Active Problem List   Diagnosis Date Noted  . Goals of care, counseling/discussion   . Palliative care by specialist   . Acute respiratory failure with hypoxia ([place]) 03/12/2020  . Acute on chronic systolic CHF (congestive heart failure) ([place]) 02/04/2020  . SOB (shortness of breath) 01/29/2020  . Sleep disturbance 01/29/2020  . Chronic systolic  congestive heart failure ([place]) 09/05/2017  . Neoplasm of uncertain behavior of skin  01/25/2017  . Depression 07/19/2016  . CAD (coronary artery disease) 05/09/2016  . Diaphragmatic hernia without obstruction and without gangrene 05/09/2016  . Memory loss 04/17/2016  . Normocytic anemia 03/12/2016  . Upper GI bleed 02/12/2016  . Benign essential HTN 02/12/2016  . Anticoagulant long-term use 02/12/2016  . Chronic atrial fibrillation ([place]) 04/28/2014  . Inguinal hernia 04/28/2014  . Sleep apnea 10/26/2013  . Ischemic heart disease 04/27/2011  . Hypercholesterolemia 04/27/2011  . GERD (gastroesophageal reflux disease) 04/27/2011  . BPH (benign prostatic hyperplasia) 04/27/2011   PCP:  [name], DO Pharmacy:   [facility] [address], [place] [address] [place] [address] Phone: [phone] Fax: [phone], [place], [place] - [place] [address] Phone: [phone] Fax: [phone]     Social Determinants of Health (SDOH) Interventions    Readmission Risk Interventions No flowsheet data found.

## 2020-04-12 NOTE — Discharge Summary (Signed)
Death Summary  VONTRELL PULLMAN [id number] DOB: 05/05/1931 DOA: April 20, 2020  PCP: [name], DO PCP/Office notified  Admit date: 2020/04/20 Date of Death: 04-23-20  Final Diagnoses:  Principal Problem:   Acute respiratory failure with hypoxia ([place]) Active Problems:   GERD (gastroesophageal reflux disease)   BPH (benign prostatic hyperplasia)   Chronic atrial fibrillation ([name])   Benign essential HTN   Anticoagulant long-term use   CAD (coronary artery disease)   Acute on chronic systolic CHF (congestive heart failure) ([name])   Goals of care, counseling/discussion   Palliative care by specialist   Pleural effusion   Cardiogenic shock ([place])      History of present illness:  86 year old with past medical history significant for A. fib on anticoagulation, CAD prior history of MI, chronic systolic heart failure with ejection fraction 15 to 20% by echo 01/2020 with global hypokinesis of the left ventricle, patient presents with continued shortness of breath since 4 days prior to admission.  Patient reports shortness of breath on exertion and significant orthopnea.  Evaluation in the ED chest x-ray showed pulmonary congestion, systolic blood pressure has been in the 90s.  Patient received IV Laxis.  Cardiology has been consulted to help with management of heart failure.  BNP 1200, troponin 46. Patient was a started on IV Lasix, he became hypotensive and he was started on dopamine.  Heart failure team consulted.  Patient unfortunately is not good candidate for advanced therapies or home inotropes.  Family met with palliative care team, plan is to transition to full comfort care on 04/06/2020 after multiples  Family member  Arrives  from out of state. Patient continue to decline the afternoon of 5-25. He was started on dilaudid and ativan for symptoms management.   Hospital Course:   1-Acute Hypoxic respiratory failure\secondary to systolic heart failure exacerbation pulmonary  edema. -Appreciate cardiology evaluation.  - Started  Inotropic, dopamine.   -lasix and spironolactone on hold due to hypotension and low CVP -moving toward full comfort care after family arrives on 5/26. -Patient continue to decline, his symptoms for end of life were manage with dilaudid and ativan.   2-Acute on chronic systolic heart failure, EF 15-20 % -Treated  with Lasix 40 mg IV twice daily - Started  Inotropic, dopamine.   -Lasix and spironolactone on hold due to hypotension and low CVP -Moving toward full comfort care after family arrives on 5/26. Weight; 143--136 Evaluate by HF team not candidate for advanced heart failure therapies.    3-CAD: per cardiology BB on hold and Continue with eliquis  4-A. Fib Metoprolol on hold to hypotension Continue with Eliquis  5-BPH: Continue with Cardura  6-GERD Continue with PPI  7*Fever; ? Aspiration; UA with 0-5 WBC. started on ceftriaxone. No leukocytosis. Had fever one day, then remain afebrile.  8- Cardiogenic Shock; on dobutamine. Plan to discontinue dobutamine when family arrives from oust state, but patient continue to decline despite pressors.     Time: 20:50 PM  Signed:  Caragh Gasper A Alda Gaultney  [facility] 04/08/2020, 7:36 AM

## 2020-04-12 NOTE — Progress Notes (Addendum)
Palliative:  HPI: 86 y.o. male  with past medical history of CHF EF 15-20%, CAD, atrial fibrillation on anticoagulation, HTN, cognitive impairment and underlying confusion, GERD, BPH admitted from ALF on 04/08/2020 with shortness of breath related to acute on chronic heart failure requiring diuresis. He is now requiring vasopressor support for hypotension and has had fever 101 F 5/23 but temp normal prior to 101 fever and since. Being treated for UTI. Acute decline 5/25 and actively dying.   I came to bedside multiple times throughout the day to ensure comfort. Henry Ayala was alert and interactive this morning with some mild and intermittent dyspnea. Poor response to medications to provide relief throughout the day. By the afternoon he has continued with significant progression with severe dyspnea, anxiety, agitation with cool and cyanotic extremities with mottling on lower extremities. He finally had adequate relief with dilaudid and ativan. However, he also appeared to have 2 brief episodes that appeared to be seizure activity and an additional 4 mg IV ativan providing at that time.   I spent time discussing with daughter, Henry Ayala, at bedside signs that he is progressing very quickly this afternoon towards end of life. Her son arrived and I discussed signs and symptoms with him as well. I called chaplain for additional support. Son is en-route via plane and should be arriving this evening ~4-5 pm.   All questions/concerns addressed. Emotional support provided.   Exam: (at end of the day after comfortable) Unresponsive/sedated. Cyanosis to lips and fingers. Cold hands/feet/legs. Legs with mottling. Breathing irregular with frequent apnea episodes and Cheyne-Stokes.   Plan: - Dilaudid infusion initiated for dyspnea control (failed trials of morphine and fentanyl).  - Ativan scheduled and as needed for seizure prevention and anxiety.  - Robinul scheduled for better control of secretions.  -  Anticipate hospital death.   [place], NP Palliative Medicine Team Pager [phone] (Please see [email or web address] for schedule) Team Phone [phone]    Greater than 50%  of this time was spent counseling and coordinating care related to the above assessment and plan

## 2020-04-12 NOTE — Progress Notes (Addendum)
Pt restless and orthopneic throughout night shift. Concern for hypotension, so Morphine was not initial intervention. Pt repositioned multiple times and Haldol given at 23:25, but not effective. Morphine oral solution given at 0104, but not effective. MD paged and orders received for Duoneb nebulizer, but not effective. IV Morphine given at 05:08. Pt able to rest for about an hour before becoming somewhat restless again.

## 2020-04-12 NOTE — Care Management Important Message (Signed)
Important Message  Patient Details  Name: Henry Ayala MRN: [id number] Date of Birth: 1931-07-09   Medicare Important Message Given:  Yes     Shelda Altes 04/19/20, 1:05 PM

## 2020-04-12 NOTE — Progress Notes (Signed)
Chaplain rec'd referral from outgoing chaplain.  Visited patient and family at 30 PM.  Room full of family. Patient's daughter is a personal friend of Clinical biochemist. They went to high school together.  Chaplain met patient's son who had just flown in from [facility].  [employer] of presence and a blessing.  Chaplain conferred with staff about the patient's daughter desire to stay bedside for the night with her brother. Charge nurse may be asked if this is possible since family knows he is actively dying and does not wish for him to be alone.  Please page or call if additional support is requested. Rev. Tamsen Snider [facility] pager [phone]

## 2020-04-12 NOTE — Progress Notes (Addendum)
Pt has expired. MD paged. Order received for 2 RN's to pronounce death. 2 RN's pronounced death at 21:50.  Pt's preferred funeral home is [facility] in [place], [place]. [place] [facility] notified. Death Certificate signed by MD. Post-mortem checklist complete. Patient placement notified.

## 2020-04-12 NOTE — Progress Notes (Signed)
40 mL IV Dilaudid wasted into stericycle by this RN Janeann Merl, RN) and Jaye Beagle, RN.

## 2020-04-12 NOTE — Progress Notes (Signed)
PROGRESS NOTE    Henry Ayala  UXL:[id number] DOB: 03/18/1931 DOA: 03/15/2020 PCP: [name], DO   Brief Narrative: 86 year old with past medical history significant for A. fib on anticoagulation, CAD prior history of MI, chronic systolic heart failure with ejection fraction 15 to 20% by echo 01/2020 with global hypokinesis of the left ventricle, patient presents with continued shortness of breath since 4 days prior to admission.  Patient reports shortness of breath on exertion and significant orthopnea.  Evaluation in the ED chest x-ray showed pulmonary congestion, systolic blood pressure has been in the 90s.  Patient received IV Laxis.  Cardiology has been consulted to help with management of heart failure.  BNP 1200, troponin 46. Patient was a started on IV Lasix, he became hypotensive and he was started on dopamine.  Heart failure team consulted.  Patient unfortunately is not good candidate for advanced therapies or home inotropes.  Family met with palliative care team, plan is to transition to full comfort care on 04/06/2020 after multiples  Family member  [place]  [place].   Assessment & Plan:   Principal Problem:   Acute respiratory failure with hypoxia ([name]) Active Problems:   GERD (gastroesophageal reflux disease)   BPH (benign prostatic hyperplasia)   Chronic atrial fibrillation ([name])   Benign essential HTN   Anticoagulant long-term use   CAD (coronary artery disease)   Acute on chronic systolic CHF (congestive heart failure) ([name])   Goals of care, counseling/discussion   Palliative care by specialist   1-Acute Hypoxic respiratory failure\secondary to systolic heart failure exacerbation pulmonary edema. -Appreciate cardiology evaluation.  - Started  Inotropic, dopamine.   -lasix and spironolactone on hold due to hypotension and low CVP -moving toward full comfort care after family arrives on 5/26.  2-Acute on chronic systolic heart failure -Treated   with Lasix 40 mg IV twice daily - Started  Inotropic, dopamine.   -Lasix and spironolactone on hold due to hypotension and low CVP -Moving toward full comfort care after family arrives on 5/26. Weight; 143--136 -We will discontinue telemetry and will continue with vital signs check until tomorrow while he is on dopamine.  3-CAD: per cardiology BB on hold and Continue with eliquis  4-A. Fib Metoprolol on hold to hypotension Continue with Eliquis  5-BPH: Continue with Cardura  6-GERD Continue with PPI  7*Fever; ? Aspiration? UTI; started on ceftriaxone.   Possible Cardiogenic Shock; on dobutamine.   Estimated body mass index is 21.37 kg/m as calculated from the following:   Height as of this encounter: 5' 7"  (1.702 m).   Weight as of this encounter: 61.9 kg.   DVT prophylaxis: Eliquis Code Status:  DNR, discussed with Daughter.  Family Communication: Daughter at bedside Disposition Plan:  Status is: Inpatient  Remains inpatient appropriate because:IV treatments appropriate due to intensity of illness or inability to take PO   Dispo: The patient is from: Home              Anticipated d/c is to: residential hospice facility               Anticipated d/c date is: 2 days              Patient currently is not medically stable to d/c.      Consultants:   Cardiology  Procedures:   None  Antimicrobials:  None  Subjective: He is sleepy, he will open eyes to voice, he denies worsening shortness of breath. He has  been feeling warm  Objective: Vitals:   05-01-2020 0200 2020-05-01 0511 2020-05-01 0743 2020/05/01 1152  BP:  107/65 97/65 101/73  Pulse:   (!) 107 76  Resp: (!) 26   16  Temp:   97.7 F (36.5 C) 98 F (36.7 C)  TempSrc:   Oral   SpO2:   99% 97%  Weight:      Height:        Intake/Output Summary (Last 24 hours) at May 01, 2020 1458 Last data filed at 01-May-2020 1300 Gross per 24 hour  Intake 800 ml  Output 750 ml  Net 50 ml   Filed Weights    03/17/2020 2106 04/03/20 0409 04/04/20 0427  Weight: 62.1 kg 62 kg 61.9 kg    Examination:  General exam: Lethargic, chronically ill-appearing Respiratory system: Bilateral crackles Cardiovascular system: 1, S2 regular rhythm and rate systolic murmur Gastrointestinal system: Bowel sounds present, soft nontender not distended Central nervous system: Sleepy Extremities: Trace edema     Data Reviewed: I have personally reviewed following labs and imaging studies  CBC: Recent Labs  Lab 04/01/2020 1348 04/04/20 0953  WBC 10.5 8.7  NEUTROABS 8.6* 7.3  HGB 15.0 14.6  HCT 45.8 44.7  MCV 101.1* 102.3*  PLT 178 710   Basic Metabolic Panel: Recent Labs  Lab 03/16/2020 1348 04/03/20 0558 04/04/20 0442 05-01-20 0454  NA 138 138 139 137  K 3.7 3.3* 3.4* 3.9  CL 94* 95* 90* 93*  CO2 30 34* 34* 32  GLUCOSE 106* 127* 156* 144*  BUN 31* 27* 22 23  CREATININE 1.14 1.31* 1.27* 1.10  CALCIUM 8.6* 8.3* 8.5* 8.2*  MG  --  1.9  --   --    GFR: Estimated Creatinine Clearance: 42.2 mL/min (by C-G formula based on SCr of 1.1 mg/dL). Liver Function Tests: Recent Labs  Lab 03/23/2020 1348  AST 41  ALT 76*  ALKPHOS 46  BILITOT 1.5*  PROT 5.8*  ALBUMIN 3.2*   No results for input(s): LIPASE, AMYLASE in the last 168 hours. No results for input(s): AMMONIA in the last 168 hours. Coagulation Profile: No results for input(s): INR, PROTIME in the last 168 hours. Cardiac Enzymes: No results for input(s): CKTOTAL, CKMB, CKMBINDEX, TROPONINI in the last 168 hours. BNP (last 3 results) No results for input(s): PROBNP in the last 8760 hours. HbA1C: No results for input(s): HGBA1C in the last 72 hours. CBG: No results for input(s): GLUCAP in the last 168 hours. Lipid Profile: No results for input(s): CHOL, HDL, LDLCALC, TRIG, CHOLHDL, LDLDIRECT in the last 72 hours. Thyroid Function Tests: No results for input(s): TSH, T4TOTAL, FREET4, T3FREE, THYROIDAB in the last 72 hours. Anemia Panel:  No results for input(s): VITAMINB12, FOLATE, FERRITIN, TIBC, IRON, RETICCTPCT in the last 72 hours. Sepsis Labs: No results for input(s): PROCALCITON, LATICACIDVEN in the last 168 hours.  Recent Results (from the past 240 hour(s))  SARS Coronavirus 2 by RT PCR (hospital order, performed in [facility] hospital lab) Nasopharyngeal Nasopharyngeal Swab     Status: None   Collection Time: 03/19/2020  2:48 PM   Specimen: Nasopharyngeal Swab  Result Value Ref Range Status   SARS Coronavirus 2 NEGATIVE NEGATIVE Final    Comment: (NOTE) SARS-CoV-2 target nucleic acids are NOT DETECTED. The SARS-CoV-2 RNA is generally detectable in upper and lower respiratory specimens during the acute phase of infection. The lowest concentration of SARS-CoV-2 viral copies this assay can detect is 250 copies / mL. A negative result does not preclude SARS-CoV-2  infection and should not be used as the sole basis for treatment or other patient management decisions.  A negative result may occur with improper specimen collection / handling, submission of specimen other than nasopharyngeal swab, presence of viral mutation(s) within the areas targeted by this assay, and inadequate number of viral copies (<250 copies / mL). A negative result must be combined with clinical observations, patient history, and epidemiological information. Fact Sheet for Patients:   StrictlyIdeas.no Fact Sheet for Healthcare Providers: BankingDealers.co.za This test is not yet approved or cleared  by the [place] FDA and has been authorized for detection and/or diagnosis of SARS-CoV-2 by FDA under an Emergency Use Authorization (EUA).  This EUA will remain in effect (meaning this test can be used) for the duration of the COVID-19 declaration under Section 564(b)(1) of the Act, 21 U.S.C. section 360bbb-3(b)(1), unless the authorization is terminated or revoked sooner. Performed at [facility], [address] [place], [place] [address]   MRSA PCR Screening     Status: None   Collection Time: 04/08/2020  9:42 PM   Specimen: Nasal Mucosa; Nasopharyngeal  Result Value Ref Range Status   MRSA by PCR NEGATIVE NEGATIVE Final    Comment:        The GeneXpert MRSA Assay (FDA approved for NASAL specimens only), is one component of a comprehensive MRSA colonization surveillance program. It is not intended to diagnose MRSA infection nor to guide or monitor treatment for MRSA infections. Performed at [facility], [place] [address] [place], [place] [address]   Urine Culture     Status: Abnormal (Preliminary result)   Collection Time: 04/04/20 10:17 AM   Specimen: Urine, Random  Result Value Ref Range Status   Specimen Description URINE, RANDOM  Final   Special Requests   Final    NONE Performed at [facility], [place] [address], [place], [place] [address]    Culture >=100,000 COLONIES/mL STAPHYLOCOCCUS EPIDERMIDIS (A)  Final   Report Status PENDING  Incomplete         Radiology Studies: DG CHEST PORT 1 VIEW  Result Date: 04/03/2020 CLINICAL DATA:  Status post PICC line placement EXAM: PORTABLE CHEST 1 VIEW COMPARISON:  CT scan May 09, 2016. Chest x-ray February 04, 2020 and Apr 02, 2021. FINDINGS: Right PICC line terminates in the central SVC. The patient has a known large hiatal hernia. Previous imaging demonstrated colon herniated above the diaphragm to the right, again identified. Small pleural effusions with underlying atelectasis. No other changes. IMPRESSION: The right PICC line terminates in the central SVC, in good position. No other changes. Small pleural effusions. Herniations as above. Electronically Signed   By: Dorise Bullion III M.D   On: 04/03/2020 20:05   [place] EKG SITE RITE  Result Date: 04/03/2020 If Site Rite image not attached, placement could not be confirmed due to current cardiac rhythm.       Scheduled Meds: . apixaban  5 mg Oral BID   . Chlorhexidine Gluconate Cloth  6 each Topical Daily  . LORazepam  4 mg Intravenous STAT  . magnesium oxide  200 mg Oral BID  . midodrine  10 mg Oral TID WC  . mirtazapine  15 mg Oral QHS  . pantoprazole  40 mg Oral BID  . sodium chloride flush  10-40 mL Intracatheter Q12H  . sodium chloride flush  3 mL Intravenous Q12H   Continuous Infusions: . sodium chloride    . cefTRIAXone (ROCEPHIN)  IV 1  g (04-27-2020 1130)  . DOPamine 3 mcg/kg/min (04/04/20 2311)  . HYDROmorphone       LOS: 3 days    Time spent: 35 minutes.     Elmarie Shiley, MD [facility]   If 7PM-7AM, please contact night-coverage [email or web address]  04-27-2020, 2:58 PM

## 2020-04-12 DEATH — deceased

## 2020-04-29 ENCOUNTER — Telehealth: Payer: Medicare Other | Admitting: Family Medicine
